# Patient Record
Sex: Male | Born: 1950
Health system: Southern US, Community
[De-identification: ages and names within clinical notes are randomized; demographics above are authoritative.]

## PROBLEM LIST (undated history)

## (undated) DIAGNOSIS — I10 Essential (primary) hypertension: Secondary | ICD-10-CM

## (undated) DIAGNOSIS — C61 Malignant neoplasm of prostate: Secondary | ICD-10-CM

## (undated) HISTORY — PX: TONSILLECTOMY: SUR1361

## (undated) HISTORY — DX: Essential (primary) hypertension: I10

## (undated) HISTORY — PX: COLONOSCOPY: SHX174

## (undated) HISTORY — PX: PROSTATE BIOPSY: SHX241

---

## 2002-03-01 ENCOUNTER — Emergency Department (HOSPITAL_COMMUNITY): Admission: EM | Admit: 2002-03-01 | Discharge: 2002-03-01 | Payer: Self-pay | Admitting: Emergency Medicine

## 2003-02-12 ENCOUNTER — Encounter: Admission: RE | Admit: 2003-02-12 | Discharge: 2003-02-12 | Payer: Self-pay | Admitting: Family Medicine

## 2003-02-12 ENCOUNTER — Encounter: Payer: Self-pay | Admitting: Family Medicine

## 2003-03-12 ENCOUNTER — Encounter: Admission: RE | Admit: 2003-03-12 | Discharge: 2003-03-22 | Payer: Self-pay | Admitting: Family Medicine

## 2005-04-23 ENCOUNTER — Emergency Department (HOSPITAL_COMMUNITY): Admission: EM | Admit: 2005-04-23 | Discharge: 2005-04-23 | Payer: Self-pay | Admitting: Emergency Medicine

## 2005-08-18 ENCOUNTER — Emergency Department (HOSPITAL_COMMUNITY): Admission: EM | Admit: 2005-08-18 | Discharge: 2005-08-18 | Payer: Self-pay | Admitting: Family Medicine

## 2005-11-05 ENCOUNTER — Emergency Department (HOSPITAL_COMMUNITY): Admission: EM | Admit: 2005-11-05 | Discharge: 2005-11-05 | Payer: Self-pay | Admitting: Emergency Medicine

## 2005-11-30 ENCOUNTER — Emergency Department (HOSPITAL_COMMUNITY): Admission: EM | Admit: 2005-11-30 | Discharge: 2005-11-30 | Payer: Self-pay | Admitting: Emergency Medicine

## 2009-05-31 ENCOUNTER — Emergency Department (HOSPITAL_COMMUNITY): Admission: EM | Admit: 2009-05-31 | Discharge: 2009-06-01 | Payer: Self-pay | Admitting: Emergency Medicine

## 2014-01-30 ENCOUNTER — Emergency Department (HOSPITAL_COMMUNITY)
Admission: EM | Admit: 2014-01-30 | Discharge: 2014-01-30 | Disposition: A | Discharge status: Home / Self Care | Payer: Self-pay | Attending: Emergency Medicine | Admitting: Emergency Medicine

## 2014-01-30 ENCOUNTER — Encounter (HOSPITAL_COMMUNITY): Payer: Self-pay | Admitting: Emergency Medicine

## 2014-01-30 ENCOUNTER — Emergency Department (HOSPITAL_COMMUNITY): Payer: Self-pay

## 2014-01-30 DIAGNOSIS — M79609 Pain in unspecified limb: Secondary | ICD-10-CM | POA: Insufficient documentation

## 2014-01-30 DIAGNOSIS — M7989 Other specified soft tissue disorders: Secondary | ICD-10-CM

## 2014-01-30 DIAGNOSIS — M79606 Pain in leg, unspecified: Secondary | ICD-10-CM

## 2014-01-30 DIAGNOSIS — Z791 Long term (current) use of non-steroidal anti-inflammatories (NSAID): Secondary | ICD-10-CM | POA: Insufficient documentation

## 2014-01-30 DIAGNOSIS — Z79899 Other long term (current) drug therapy: Secondary | ICD-10-CM | POA: Insufficient documentation

## 2014-01-30 DIAGNOSIS — F10929 Alcohol use, unspecified with intoxication, unspecified: Secondary | ICD-10-CM

## 2014-01-30 DIAGNOSIS — Z880 Allergy status to penicillin: Secondary | ICD-10-CM | POA: Insufficient documentation

## 2014-01-30 DIAGNOSIS — F1010 Alcohol abuse, uncomplicated: Secondary | ICD-10-CM | POA: Insufficient documentation

## 2014-01-30 LAB — CBC WITH DIFFERENTIAL/PLATELET
Basophils Absolute: 0 10*3/uL (ref 0.0–0.1)
Basophils Relative: 0 % (ref 0–1)
Eosinophils Absolute: 0.1 10*3/uL (ref 0.0–0.7)
Eosinophils Relative: 1 % (ref 0–5)
HCT: 39 % (ref 39.0–52.0)
Hemoglobin: 13 g/dL (ref 13.0–17.0)
Lymphocytes Relative: 46 % (ref 12–46)
Lymphs Abs: 2.7 10*3/uL (ref 0.7–4.0)
MCH: 29.6 pg (ref 26.0–34.0)
MCHC: 33.3 g/dL (ref 30.0–36.0)
MCV: 88.8 fL (ref 78.0–100.0)
Monocytes Absolute: 0.4 10*3/uL (ref 0.1–1.0)
Monocytes Relative: 6 % (ref 3–12)
Neutro Abs: 2.7 10*3/uL (ref 1.7–7.7)
Neutrophils Relative %: 46 % (ref 43–77)
Platelets: 293 10*3/uL (ref 150–400)
RBC: 4.39 MIL/uL (ref 4.22–5.81)
RDW: 15.8 % — ABNORMAL HIGH (ref 11.5–15.5)
WBC: 5.8 10*3/uL (ref 4.0–10.5)

## 2014-01-30 LAB — ETHANOL: Alcohol, Ethyl (B): 345 mg/dL — ABNORMAL HIGH (ref 0–11)

## 2014-01-30 LAB — COMPREHENSIVE METABOLIC PANEL
ALT: 24 U/L (ref 0–53)
AST: 23 U/L (ref 0–37)
Albumin: 3.8 g/dL (ref 3.5–5.2)
Alkaline Phosphatase: 48 U/L (ref 39–117)
BUN: 9 mg/dL (ref 6–23)
CO2: 27 mEq/L (ref 19–32)
Calcium: 8.8 mg/dL (ref 8.4–10.5)
Chloride: 103 mEq/L (ref 96–112)
Creatinine, Ser: 0.77 mg/dL (ref 0.50–1.35)
GFR calc Af Amer: >90 mL/min (ref >90)
GFR calc non Af Amer: >90 mL/min (ref >90)
Glucose, Bld: 105 mg/dL — ABNORMAL HIGH (ref 70–99)
Potassium: 4.1 mEq/L (ref 3.7–5.3)
Sodium: 143 mEq/L (ref 137–147)
Total Bilirubin: <0.2 mg/dL — ABNORMAL LOW (ref 0.3–1.2)
Total Protein: 8.8 g/dL — ABNORMAL HIGH (ref 6.0–8.3)

## 2014-01-30 LAB — PROTIME-INR
INR: 1.06 (ref 0.00–1.49)
Prothrombin Time: 13.6 seconds (ref 11.6–15.2)

## 2014-01-30 MED ORDER — NAPROXEN 500 MG PO TABS: 500.0000 mg | ORAL_TABLET | Freq: Two times a day (BID) | ORAL | Status: DC

## 2014-01-30 MED ORDER — MORPHINE SULFATE 4 MG/ML IJ SOLN
4.0000 mg | Freq: Once | INTRAMUSCULAR | Status: DC
  Filled 2014-01-30: qty 1

## 2014-01-30 NOTE — Progress Notes (Signed)
Right lower extremity venous duplex completed.  Right:  No evidence of DVT, superficial thrombosis, or Baker's cyst.  Left:  Negative for DVT in the common femoral vein.  

## 2014-01-30 NOTE — ED Provider Notes (Signed)
CSN: [id number]     Arrival date & time 01/30/14  1358 History   First MD Initiated Contact with Patient 01/30/14 1421     Chief Complaint  Patient presents with  . Leg Pain    HPI Patient presents to the emergency room with complaints of right leg pain ongoing for the last week. Patient does not recall any specific injuries.  The patient has not had any fevers or chills. The pain in his calf is cramping. It increases with movement and palpation. He has noticed swelling as well. He denies any history of prior DVT or PE. 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 Patient was drinking some alcohol this morning. He had a fall and was unable to get up. He was then brought to the emergency room for evaluation.   History reviewed. No pertinent past medical history. History reviewed. No pertinent past surgical history. No family history on file. History  Substance Use Topics  . Smoking status: Not on file  . Smokeless tobacco: Not on file  . Alcohol Use: Not on file    Review of Systems  All other systems reviewed and are negative.    Allergies  Penicillins  Home Medications   Current Outpatient Rx  Name  Route  Sig  Dispense  Refill  . Aspirin-Acetaminophen-Caffeine (GOODY HEADACHE PO)   Oral   Take 1 packet by mouth daily as needed.         . Misc Natural Products (PROSTATE HEALTH) CAPS   Oral   Take 1 capsule by mouth 2 (two) times daily.         . multivitamin-iron-minerals-folic acid (CENTRUM) chewable tablet   Oral   Chew 1 tablet by mouth daily.         . naproxen (NAPROSYN) 500 MG tablet   Oral   Take 1 tablet (500 mg total) by mouth 2 (two) times daily with a meal.   30 tablet   0    BP 144/96  Pulse 98  Temp(Src) 98 F (36.7 C) (Oral)  SpO2 96% Physical Exam  Nursing note and vitals reviewed. Constitutional: He appears well-developed and well-nourished. No distress.  HENT:  Head: Normocephalic and atraumatic.  Right Ear: External  ear normal.  Left Ear: External ear normal.  Eyes: Conjunctivae are normal. Right eye exhibits no discharge. Left eye exhibits no discharge. No scleral icterus.  Neck: Neck supple. No tracheal deviation present.  Cardiovascular: Normal rate, regular rhythm and intact distal pulses.   Pulmonary/Chest: Effort normal and breath sounds normal. No stridor. No respiratory distress. He has no wheezes. He has no rales.  Abdominal: Soft. Bowel sounds are normal. He exhibits no distension. There is no tenderness. There is no rebound and no guarding.  Musculoskeletal: He exhibits edema and tenderness.  Edema and tenderness of the right calf, no swelling noted about the ankle, no knee tenderness, no knee effusion, strong dorsalis pedis pulse in the right foot, extremities warm and well perfused  Neurological: He is alert. He has normal strength. No cranial nerve deficit (no facial droop, extraocular movements intact, no slurred speech) or sensory deficit. He exhibits normal muscle tone. He displays no seizure activity. Coordination normal.  Skin: Skin is warm and dry. No rash noted.  Psychiatric: He has a normal mood and affect.    ED Course  Procedures (including critical care time) Labs Review Labs Reviewed  CBC WITH DIFFERENTIAL - Abnormal; Notable for the following:    RDW 15.8 (*)  All other components within normal limits  COMPREHENSIVE METABOLIC PANEL - Abnormal; Notable for the following:    Glucose, Bld 105 (*)    Total Protein 8.8 (*)    Total Bilirubin <0.2 (*)    All other components within normal limits  ETHANOL - Abnormal; Notable for the following:    Alcohol, Ethyl (B) 345 (*)    All other components within normal limits  PROTIME-INR   Imaging Review Dg Tibia/fibula Right  01/30/2014   CLINICAL DATA:  Right leg pain.  EXAM: RIGHT TIBIA AND FIBULA - 2 VIEW  COMPARISON:  None.  FINDINGS: Two views of the right lower leg were obtained. There is an old fracture of the proximal  fibula with callus formation. Areas of sclerosis in the distal tibia and fibula may also be related to posttraumatic changes. No evidence for an acute fracture or dislocation. The knee and ankle are located.  IMPRESSION: No acute bone abnormality in the right lower leg. Old right fibular fracture.   Electronically Signed   By: Markus Daft M.D.   On: 01/30/2014 15:07   Dg Ankle Complete Right  01/30/2014   CLINICAL DATA:  Right leg pain.  EXAM: RIGHT ANKLE - COMPLETE 3+ VIEW  COMPARISON:  None.  FINDINGS: There is diffuse soft tissue thickening or swelling in the right lower leg. The right ankle is located. Mild degenerative changes along the medial aspect of the ankle joint. Small spur along the plantar aspect of the calcaneus. Mild cortical thickening along the lateral aspect of the distal tibia could be related to an old injury but no acute fracture.  IMPRESSION: Mild degenerative changes in the right ankle without acute bone abnormality.  Diffuse soft tissue swelling.   Electronically Signed   By: Markus Daft M.D.   On: 01/30/2014 15:09     MDM   1. Leg pain   2. Alcohol intoxication    No fracture or DVT.  Exam not suggestive of cellulitis.  ? Calf injury.  May have chronic edema with his prior fracture.   Will monitor in the ED until he sobers up.  At this time there does not appear to be any evidence of an acute emergency medical condition and the patient appears stable for discharge with appropriate outpatient follow up.     Kathalene Frames, MD 01/30/14 [phone]

## 2014-01-30 NOTE — ED Notes (Signed)
Bed: HK32 Expected date: 01/30/14 Expected time: 1:51 PM Means of arrival: Ambulance Comments:  Intoxicated, hypertensive, leg pain

## 2014-01-30 NOTE — ED Notes (Signed)
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

## 2014-01-30 NOTE — Discharge Instructions (Signed)
°Emergency Department Resource Guide °1) Find a Doctor and Pay Out of Pocket °Although you won't have to find out who is covered by your insurance plan, it is a good idea to ask around and get recommendations. You will then need to call the office and see if the doctor you have chosen will accept you as a new patient and what types of options they offer for patients who are self-pay. Some doctors offer discounts or will set up payment plans for their patients who do not have insurance, but you will need to ask so you aren't surprised when you get to your appointment. ° °2) Contact Your Local Health Department °Not all health departments have doctors that can see patients for sick visits, but many do, so it is worth a call to see if yours does. If you don't know where your local health department is, you can check in your phone book. The CDC also has a tool to help you locate your state's health department, and many state websites also have listings of all of their local health departments. ° °3) Find a Walk-in Clinic °If your illness is not likely to be very severe or complicated, you may want to try a walk in clinic. These are popping up all over the country in pharmacies, drugstores, and shopping centers. They're usually staffed by nurse practitioners or physician assistants that have been trained to treat common illnesses and complaints. They're usually fairly quick and inexpensive. However, if you have serious medical issues or chronic medical problems, these are probably not your best option. ° °No Primary Care Doctor: °- Call [facility] at  832-8000 - they can help you locate a primary care doctor that  accepts your insurance, provides certain services, etc. °- Physician Referral Service- 1-800-533-3463 ° °Chronic Pain Problems: °Organization         Address  Phone   Notes  °[facility]  (336) 297-2271 Patients need to be referred by their primary care doctor.  ° °Medication  Assistance: °Organization         Address  Phone   Notes  °[place] [facility] [address] °[place], [place] [address] (336) 641-8030 --Must be a resident of [place] °-- Must have NO insurance coverage whatsoever (no Medicaid/ Medicare, etc.) °-- The pt. MUST have a primary care doctor that directs their care regularly and follows them in the community °  °[facility]  (866) 331-1348   °[employer]  (888) 892-1162   ° °Agencies that provide inexpensive medical care: °Organization         Address  Phone   Notes  °[facility]  (336) 832-8035   °[facility]    (336) 832-7272   °[facility] [address] °[place], [place] [address] (336) 832-4777   °[facility] [address], °[place] (336) 271-4999   °[facility]    (336) 373-0678   °[facility]    (336) 272-1050   °[facility] ° [address], [place] Phone:  (336) 832-4444, Fax:  (336) 832-4440 Hours of Operation:  9 am - 6 pm, M-F.  Also accepts Medicaid/Medicare and self-pay.  °[facility] ° [address], [place] Phone: (336) 832-3150, Fax: (336) 832-3151. Hours of Operation:  8:30 am - 5:30 pm, M-F.  Also accepts Medicaid and self-pay.  °[facility] [place] [address], [place] Phone: (336) 878-6027   °[facility] [address], [place], [place] (336)723-1848, Ext. 123 Mondays & Thursdays: 7-9 AM.  First 15 patients are seen on a first come, first serve basis. °  ° °Medicaid-accepting [place] Providers: ° °Organization         Address  Phone   Notes  °[facility] [address], [place] (336) 641-2100 Also accepts self-pay patients.  °[facility] [address], [place] ° (336) 856-9996   °[facility] [address], [place]  (336) 288-8857   °[facility] [address] [place] [address], [place] (336) 299-7000   °Veita Bland [address], [place]  ° (336) 373-1557 Only accepts [place] Access Medicaid patients after they have their name applied to their card.  ° °Self-Pay (no insurance) in [place]: ° °Organization         Address  Phone   Notes  °Sickle Cell Patients, [facility] [address], [place] (336) 832-1970   °[facility] [address], [place] (336) 832-4400   °[facility] ° [address], [place] (336) 992-4800   °[facility]/Dr. Osei-Bonsu ° [address] [place] [address], [place] or [address], [place] (336) [phone] Phone number for both [place] and [place] locations is the same.  °[facility] [address], [place] (336) 299-0000   °[facility] [address] [place] [address], [place] or [address] (336) 852-7530 °(336) 878-2260   °[facility] [address], [place] (336) 350-1642, phone; (336) 294-5005, fax Sees patients 1st and 3rd Saturday of every month.  Must not qualify for public or private insurance (i.e. Medicaid, Medicare, [place] Health Choice, Veterans' Benefits) • Household income should be no more than 200% of the poverty level •The clinic cannot treat you if you are pregnant or think you are pregnant • Sexually transmitted diseases are not treated at the clinic.  ° ° °Dental Care: °Organization         Address  Phone  Notes  °[place] [facility] [address], [place] (336) 641-6152 Accepts children up to age 21 who are enrolled in Medicaid or [place] Health Choice; pregnant women with a Medicaid card; and children who have applied for Medicaid or [place] Health Choice, but were declined, whose parents can pay a reduced fee at time of service.  °[place]  [facility] [place]  [address], [place] (336) 641-7733 Accepts children up to age 21 who are enrolled in Medicaid or [place] Health Choice; pregnant women with a Medicaid card; and children who have applied for Medicaid or [place] Health Choice, but were declined, whose parents can pay a reduced fee at time of service.  °[facility] ° [address],  (336) 641-4533 Patients are seen by appointment only. Walk-ins are not accepted. [facility] will see patients 18 years of age and older. °Monday - Tuesday (8am-5pm) °Most Wednesdays (8:30-5pm) °$30 per visit, cash only  °[facility] ° [address], [place] (336) 641-4533 Patients are seen by appointment only. Walk-ins are not accepted. [facility] will see patients 18 years of age and older. °One   Wednesday Evening (Monthly: Volunteer Based).  $30 per visit, cash only  °[facility]  (919) 537-3737 for adults; Children under age 4, call Graduate Pediatric Dentistry at (919) [phone]. Children aged 4-14, please call (919) [phone] to request a pediatric application. ° Dental services are provided in all areas of dental care including fillings, crowns and bridges, complete and partial dentures, implants, gum treatment, root canals, and extractions. Preventive care is also provided. Treatment is provided to both adults and children. °Patients are selected via a lottery and there is often a waiting list. °  °[facility] [address], °[place] ° (336) 763-8833 [email or web address] °  °[facility] [address], [place], [place] (336)723-1848, Ext. 123 Second and Fourth Thursday of each month, opens at 6:30 AM; Clinic ends at 9 AM.  Patients are seen on a first-come first-served basis, and a limited number are seen during each clinic.  ° °[facility] ° [address], [place], [place] (336) 723-7904    Eligibility Requirements °You must have lived in [place], [place], or [place] counties for at least the last three months. °  You cannot be eligible for state or federal sponsored healthcare insurance, including Veterans Administration, Medicaid, or Medicare. °  You generally cannot be eligible for healthcare insurance through your employer.  °  How to apply: °Eligibility screenings are held every Tuesday and Wednesday afternoon from 1:00 pm until 4:00 pm. You do not need an appointment for the interview!  °[facility] [address], [place], [place] 336-631-2330   °[facility]  336-342-8273   °[facility]  336-703-3100   °[facility]  336-570-6415   ° °Behavioral Health Resources in the Community: °Intensive Outpatient Programs °Organization         Address  Phone  Notes  °[facility] [address], [place], [place] 336-878-6098   °[facility] [address], [place], [place] 336-832-9800   °[facility] [address], [place], [place] ° 336-882-2125   °[facility] [address],  °[place], [place] 1-800-853-5163 or 336-641-4981   °Substance Abuse Resources °Organization         Address  Phone  Notes  °[facility]  336-882-2125   °[facility]  336-784-9470   °[facility]  336-285-9073   °[facility]  336-845-3988   °Residential & Outpatient Substance Abuse Program  1-800-659-3381   °Psychological Services °Organization         Address  Phone  Notes  °[facility]  336- 832-9600   °[facility]  336- 378-7881   °[facility] [address], [place] 1-800-853-5163 or 336-641-4981   ° °Mobile Crisis Teams °Organization         Address  Phone  Notes  °[facility], Mobile Crisis Care Unit  1-877-626-1772   °Assertive °[facility] ° [address]  [place], [place] 336-834-9664   °Sharon DeEsch [address] °[place] 336-554-5454   ° °Self-Help/Support Groups °Organization         Address  Phone             Notes  °[facility] - variety of support groups  336- 373-1402 Call for more information  °Narcotics Anonymous (NA), [facility] [address], °[place]  2 meetings at this location  ° °  Residential Treatment Programs °Organization         Address  Phone  Notes  °[facility] [address],    °[place]  1-866-801-8205   °[facility] ° [address], [place], [place] 704-293-8524   °[facility] [address], [place] 336-845-3988 Admissions: 8am-3pm M-F  °[facility] [address]    °[place], [place] 336-841-1104   °[facility] [address], [place], [place] 336-379-7146   °[facility] [address]  °[place], [place] 336-285-9073   °[facility] Intensive Outpatient [address], [place], [place] 336-852-3033   °[facility] [address]  °[place], [place] 1-877-615-2722 or 336-784-9470   °[facility] [address] [place], [place] 336-227-7417 Accepts Medicaid  °[facility] [address]  °[place] 1-800-659-3381 Substance Abuse/Addiction Treatment  ° °[place] Behavioral Health Resources °Organization         Address  Phone  Notes  °[facility]  (888) 581-9988   °Julie Brannon, PhD [address] [place], [place]   (336) 349-5553 or (336) 951-0000   °[facility]   [address] °[place], [place] (336) 349-4454   °[facility] [address], [place], [place] (336) 342-8316 Insurance/Medicaid/sponsorship through [facility]  °[facility] [address]                                    [place], [place] (336) 342-8316 Therapy/tele-psych/case    °[facility] [address]  ° [place], [place] (336) 349-2233    °Dr. Arfeen  (336) 349-4544   °[facility] [place]  [employer] [place] [facility] 1) [address],  °2) [address], [place] °3)  [address], [place] (336) 349-3220 °(336) 342-7768 ° °(336) 342-8140   °[place] [facility] (336) 342-1394 or (336) 342-3537 (After Hours)    ° ° °

## 2014-01-30 NOTE — ED Notes (Signed)
Pt in from home by [facility], reports Rt leg pain x1 week. Reports fall today. Friends unable to help him up so ems called. ETOH on board. Pt denies medical Hx or daily meds. EMS reports R leg and ankle swelling.

## 2014-01-30 NOTE — ED Provider Notes (Signed)
6:54 PM Patient signed out to me by Dr. Tomi Bamberger and is now clinically sober. His speech is normal, his gait is not staggering. He will get a cab home  Leota Jacobsen, MD 01/30/14 [phone]

## 2014-04-17 ENCOUNTER — Ambulatory Visit: Payer: Self-pay | Admitting: Physician Assistant

## 2014-04-17 VITALS — BP 160/108 | HR 83 | Temp 98.0°F | Resp 16 | Ht 67.0 in | Wt 270.0 lb

## 2014-04-17 DIAGNOSIS — I10 Essential (primary) hypertension: Secondary | ICD-10-CM | POA: Insufficient documentation

## 2014-04-17 DIAGNOSIS — Z0289 Encounter for other administrative examinations: Secondary | ICD-10-CM

## 2014-04-17 NOTE — Patient Instructions (Signed)
Your blood pressure is too high. According to the guidelines, I have given you a card for 3 months.   During that time, you need to see your new primary care provider and start on blood pressure medication. Your blood pressure must be no higher than 140/90 to maintain your medical eligibility. Losing weight will help, but you should expect to be on blood pressure medicine from now on.  When you return for your next re-certification exam, you have 2 options: 1) you can have the entire exam re-done, for 1 year from that date, by any certified examiner, or 2) I can do an addendum to the original exam today, and extend the expiration date to 1 year from [place].  This option is only available if I am the provider who sees you at the visit.  No other provider can addend and extend the date.

## 2014-04-17 NOTE — Progress Notes (Signed)
This patient presents for DOT examination for fitness for duty.  Last DOT certification was for 2 years, expiration date 5-6 years ago. 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  Medical History: no  Any illness or injury in the last 5 years? no  Head/Brain Injuries, disorders or illnesses no  Seizures, epilepsy no  Eye disorders or impaired vision (except corrective lenses) no  Ear disorders, loss of hearing or balance no  Heart disease or heart attack; other cardiovascular condition no  Heart surgery (valve replacement/bypass, angioplasty, pacemaker) no  High blood pressure no  Muscular disease no  Shortness of breath no  Lung disease, emphysema, asthma, chronic bronchitis no  Kidney disease, dialysis no  Liver disease no  Digestive problems no  Diabetes or elevated blood sugar no  Nervious or psychiatric disorders, e.g., severe depression no  Loss of, or altered consciousness no  Fainting, dizziness no  Sleep disorders, pauses in breathing while asleep, daytime sleepiness, loud snoring no  Stroke or paralysis no  Missing or impaired hand, arm, foot, leg, finger, toe no  Spinal injury or disease no  Chronic low back pain no  Regular, frequent alcohol use no  Narcotic or habit forming drug use  Current Medications: Prior to Admission medications   Medication Sig Start Date End Date Taking? Authorizing Provider  Misc Natural Products (PROSTATE HEALTH) CAPS Take 1 capsule by mouth 2 (two) times daily.   Yes Historical Provider, MD  multivitamin-iron-minerals-folic acid (CENTRUM) chewable tablet Chew 1 tablet by mouth daily.   Yes Historical Provider, MD  Aspirin-Acetaminophen-Caffeine (GOODY HEADACHE PO) Take 1 packet by mouth daily as needed.    Historical Provider, MD    Primary Care Provider: No PCP Per Patient Specialists: none  Medical Examiner's Comments on Health History:  Previously told his BP was elevated, and  briefly took medication.  He reports that on recheck, his BP was normal, and the medication was not continued.  TESTING:   Visual Acuity Screening   Right eye Left eye Both eyes  Without correction: 20/30 20/30 20/20   With correction:     Comments: Peripheral Vision: Right eye 85 degrees. Left eye 85 degrees.  The patient can distinguish the colors red, amber and green.  Hearing Screening Comments: The patient was able to hear a forced whisper from 10 feet.  Monocular Vision: no  Hearing Aid used for test: no Hearing Aid required to to meet standard: no Distance from individual at which forced whispered voice can first be heard:   RIGHT ear 10 feet; LEFT ear 10 feet If audiometer used, record hearing loss in decibels:  RIGHT ear average N/A dB  LEFT ear average N/A dB  BP 172/100  Pulse 83  Temp(Src) 98 F (36.7 C) (Oral)  Resp 16  Ht 5\' 7"  (1.702 m)  Wt 270 lb (122.471 kg)  BMI 42.28 kg/m2  SpO2 97%  Pulse rate is regular  Urine Specimen: Specific Gravity 1.030, Protein NEG, Blood NEG, Sugar NEG  Other Testing: none indicated  PHYSICAL EXAMINATION:  1. yes General Appearance 2. no Eyes   3. no Ears     4. no Mouth and Throat    5. no Heart     6. no Lungs and Chest, not including breast examination  7. no Abdomen and Viscera   8. no Vascular System    9. no Genitourinary System   10. no Extremities-Limb impaired.  11.  no Spine, other musculoskeletal  12. no Neurological     Comments: Very obese.  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  Meets standards, but periodic monitoring required due to: HTN  Driver qualified only for: 3 months   Wearing corrective lenses: no Wearing hearing aid: no Accompanied by a N/A waiver/exemption Skill performance Evaluation (SPE) Certificate: no Driving within an exempt intracity zone: no Qualified by operation of 49 CFR 546.56: no  Certification  expires 07/17/2014

## 2014-07-17 ENCOUNTER — Inpatient Hospital Stay (HOSPITAL_COMMUNITY): Payer: BC Managed Care – PPO

## 2014-07-17 ENCOUNTER — Emergency Department (HOSPITAL_COMMUNITY): Payer: BC Managed Care – PPO

## 2014-07-17 ENCOUNTER — Inpatient Hospital Stay (HOSPITAL_COMMUNITY)
Admission: EM | Admit: 2014-07-17 | Discharge: 2014-07-26 | DRG: 493 | Disposition: A | Discharge status: Skilled Nursing Facility | Payer: BC Managed Care – PPO | Attending: Orthopaedic Surgery | Admitting: Orthopaedic Surgery

## 2014-07-17 ENCOUNTER — Encounter (HOSPITAL_COMMUNITY): Payer: Self-pay | Admitting: Emergency Medicine

## 2014-07-17 ENCOUNTER — Encounter (HOSPITAL_COMMUNITY)
Admission: EM | Disposition: A | Discharge status: Skilled Nursing Facility | Payer: Self-pay | Source: Home / Self Care | Attending: Orthopaedic Surgery

## 2014-07-17 ENCOUNTER — Encounter (HOSPITAL_COMMUNITY): Payer: BC Managed Care – PPO | Admitting: Anesthesiology

## 2014-07-17 ENCOUNTER — Emergency Department (HOSPITAL_COMMUNITY): Payer: BC Managed Care – PPO | Admitting: Anesthesiology

## 2014-07-17 DIAGNOSIS — F10229 Alcohol dependence with intoxication, unspecified: Secondary | ICD-10-CM | POA: Diagnosis present

## 2014-07-17 DIAGNOSIS — D62 Acute posthemorrhagic anemia: Secondary | ICD-10-CM | POA: Diagnosis not present

## 2014-07-17 DIAGNOSIS — Z880 Allergy status to penicillin: Secondary | ICD-10-CM

## 2014-07-17 DIAGNOSIS — Z6841 Body Mass Index (BMI) 40.0 and over, adult: Secondary | ICD-10-CM

## 2014-07-17 DIAGNOSIS — S82892C Other fracture of left lower leg, initial encounter for open fracture type IIIA, IIIB, or IIIC: Secondary | ICD-10-CM

## 2014-07-17 DIAGNOSIS — F10920 Alcohol use, unspecified with intoxication, uncomplicated: Secondary | ICD-10-CM

## 2014-07-17 DIAGNOSIS — I158 Other secondary hypertension: Secondary | ICD-10-CM

## 2014-07-17 DIAGNOSIS — F17200 Nicotine dependence, unspecified, uncomplicated: Secondary | ICD-10-CM | POA: Diagnosis present

## 2014-07-17 DIAGNOSIS — F10929 Alcohol use, unspecified with intoxication, unspecified: Secondary | ICD-10-CM

## 2014-07-17 DIAGNOSIS — S82899B Other fracture of unspecified lower leg, initial encounter for open fracture type I or II: Secondary | ICD-10-CM

## 2014-07-17 DIAGNOSIS — R296 Repeated falls: Secondary | ICD-10-CM | POA: Diagnosis present

## 2014-07-17 DIAGNOSIS — F1010 Alcohol abuse, uncomplicated: Secondary | ICD-10-CM

## 2014-07-17 DIAGNOSIS — S82843B Displaced bimalleolar fracture of unspecified lower leg, initial encounter for open fracture type I or II: Principal | ICD-10-CM | POA: Diagnosis present

## 2014-07-17 DIAGNOSIS — I10 Essential (primary) hypertension: Secondary | ICD-10-CM | POA: Diagnosis present

## 2014-07-17 DIAGNOSIS — T148XXA Other injury of unspecified body region, initial encounter: Secondary | ICD-10-CM

## 2014-07-17 DIAGNOSIS — Z602 Problems related to living alone: Secondary | ICD-10-CM

## 2014-07-17 HISTORY — PX: I&D EXTREMITY: SHX5045

## 2014-07-17 LAB — BASIC METABOLIC PANEL
Anion gap: 20 — ABNORMAL HIGH (ref 5–15)
BUN: 7 mg/dL (ref 6–23)
CO2: 18 mEq/L — ABNORMAL LOW (ref 19–32)
Calcium: 8.3 mg/dL — ABNORMAL LOW (ref 8.4–10.5)
Chloride: 92 mEq/L — ABNORMAL LOW (ref 96–112)
Creatinine, Ser: 0.78 mg/dL (ref 0.50–1.35)
GFR calc Af Amer: >90 mL/min (ref >90)
Glucose, Bld: 94 mg/dL (ref 70–99)
POTASSIUM: 4 meq/L (ref 3.7–5.3)
Sodium: 130 mEq/L — ABNORMAL LOW (ref 137–147)

## 2014-07-17 LAB — CBC WITH DIFFERENTIAL/PLATELET
BASOS ABS: 0 10*3/uL (ref 0.0–0.1)
Basophils Relative: 0 % (ref 0–1)
Eosinophils Absolute: 0.1 10*3/uL (ref 0.0–0.7)
Eosinophils Relative: 1 % (ref 0–5)
HCT: 38.1 % — ABNORMAL LOW (ref 39.0–52.0)
Hemoglobin: 13.1 g/dL (ref 13.0–17.0)
LYMPHS PCT: 42 % (ref 12–46)
Lymphs Abs: 3 10*3/uL (ref 0.7–4.0)
MCH: 29.4 pg (ref 26.0–34.0)
MCHC: 34.4 g/dL (ref 30.0–36.0)
MCV: 85.4 fL (ref 78.0–100.0)
MONO ABS: 0.3 10*3/uL (ref 0.1–1.0)
Monocytes Relative: 4 % (ref 3–12)
Neutro Abs: 3.8 10*3/uL (ref 1.7–7.7)
Neutrophils Relative %: 53 % (ref 43–77)
Platelets: 188 10*3/uL (ref 150–400)
RBC: 4.46 MIL/uL (ref 4.22–5.81)
RDW: 16.3 % — ABNORMAL HIGH (ref 11.5–15.5)
WBC: 7.2 10*3/uL (ref 4.0–10.5)

## 2014-07-17 LAB — URINALYSIS, ROUTINE W REFLEX MICROSCOPIC
Bilirubin Urine: NEGATIVE
GLUCOSE, UA: NEGATIVE mg/dL
HGB URINE DIPSTICK: NEGATIVE
Ketones, ur: NEGATIVE mg/dL
Leukocytes, UA: NEGATIVE
Nitrite: NEGATIVE
PROTEIN: NEGATIVE mg/dL
Specific Gravity, Urine: 1.006 (ref 1.005–1.030)
Urobilinogen, UA: 0.2 mg/dL (ref 0.0–1.0)
pH: 5 (ref 5.0–8.0)

## 2014-07-17 LAB — ETHANOL: Alcohol, Ethyl (B): 314 mg/dL — ABNORMAL HIGH (ref 0–11)

## 2014-07-17 SURGERY — IRRIGATION AND DEBRIDEMENT EXTREMITY
Anesthesia: General | Laterality: Left

## 2014-07-17 MED ORDER — THIAMINE HCL 100 MG/ML IJ SOLN
100.0000 mg | Freq: Every day | INTRAMUSCULAR | Status: DC
  Filled 2014-07-17 (×5): qty 1

## 2014-07-17 MED ORDER — METHOCARBAMOL 1000 MG/10ML IJ SOLN
500.0000 mg | Freq: Four times a day (QID) | INTRAVENOUS | Status: DC | PRN
  Filled 2014-07-17: qty 5

## 2014-07-17 MED ORDER — CEFAZOLIN SODIUM-DEXTROSE 2-3 GM-% IV SOLR
INTRAVENOUS | Status: AC
  Filled 2014-07-17: qty 50

## 2014-07-17 MED ORDER — OXYCODONE HCL 5 MG PO TABS
ORAL_TABLET | ORAL | Status: AC
  Filled 2014-07-17: qty 2

## 2014-07-17 MED ORDER — ADULT MULTIVITAMIN W/MINERALS CH
1.0000 | ORAL_TABLET | Freq: Every day | ORAL | Status: DC
  Administered 2014-07-17 – 2014-07-26 (×9): 1 via ORAL
  Filled 2014-07-17 (×10): qty 1

## 2014-07-17 MED ORDER — OXYCODONE HCL 5 MG PO TABS
5.0000 mg | ORAL_TABLET | ORAL | Status: DC | PRN
  Administered 2014-07-17: 10 mg via ORAL
  Administered 2014-07-26 (×2): 15 mg via ORAL
  Filled 2014-07-17 (×2): qty 3
  Filled 2014-07-17: qty 2

## 2014-07-17 MED ORDER — FENTANYL CITRATE 0.05 MG/ML IJ SOLN
INTRAMUSCULAR | Status: AC
  Filled 2014-07-17: qty 5

## 2014-07-17 MED ORDER — DIPHENHYDRAMINE HCL 12.5 MG/5ML PO ELIX: 25.0000 mg | ORAL_SOLUTION | ORAL | Status: DC | PRN

## 2014-07-17 MED ORDER — MIDAZOLAM HCL 2 MG/2ML IJ SOLN
INTRAMUSCULAR | Status: AC
  Filled 2014-07-17: qty 2

## 2014-07-17 MED ORDER — ONDANSETRON HCL 4 MG/2ML IJ SOLN
INTRAMUSCULAR | Status: DC | PRN
  Administered 2014-07-17: 4 mg via INTRAVENOUS

## 2014-07-17 MED ORDER — CEFAZOLIN SODIUM-DEXTROSE 2-3 GM-% IV SOLR
2.0000 g | Freq: Four times a day (QID) | INTRAVENOUS | Status: DC
  Administered 2014-07-17 – 2014-07-19 (×8): 2 g via INTRAVENOUS
  Administered 2014-07-19: 3 g via INTRAVENOUS
  Administered 2014-07-19: 2 g via INTRAVENOUS
  Filled 2014-07-17 (×13): qty 50

## 2014-07-17 MED ORDER — ENOXAPARIN SODIUM 40 MG/0.4ML ~~LOC~~ SOLN
40.0000 mg | SUBCUTANEOUS | Status: AC
  Administered 2014-07-18: 40 mg via SUBCUTANEOUS
  Filled 2014-07-17 (×2): qty 0.4

## 2014-07-17 MED ORDER — METOCLOPRAMIDE HCL 5 MG/ML IJ SOLN: 5.0000 mg | Freq: Three times a day (TID) | INTRAMUSCULAR | Status: DC | PRN

## 2014-07-17 MED ORDER — FOLIC ACID 1 MG PO TABS
1.0000 mg | ORAL_TABLET | Freq: Every day | ORAL | Status: DC
  Administered 2014-07-17 – 2014-07-26 (×9): 1 mg via ORAL
  Filled 2014-07-17 (×10): qty 1

## 2014-07-17 MED ORDER — VITAMIN B-1 100 MG PO TABS
100.0000 mg | ORAL_TABLET | Freq: Every day | ORAL | Status: DC
  Administered 2014-07-17 – 2014-07-26 (×10): 100 mg via ORAL
  Filled 2014-07-17 (×10): qty 1

## 2014-07-17 MED ORDER — METHOCARBAMOL 500 MG PO TABS
ORAL_TABLET | ORAL | Status: AC
  Filled 2014-07-17: qty 1

## 2014-07-17 MED ORDER — METHOCARBAMOL 500 MG PO TABS
500.0000 mg | ORAL_TABLET | Freq: Four times a day (QID) | ORAL | Status: DC | PRN
  Administered 2014-07-17: 500 mg via ORAL

## 2014-07-17 MED ORDER — LIDOCAINE HCL (CARDIAC) 20 MG/ML IV SOLN
INTRAVENOUS | Status: DC | PRN
  Administered 2014-07-17: 100 mg via INTRAVENOUS

## 2014-07-17 MED ORDER — SENNA 8.6 MG PO TABS
1.0000 | ORAL_TABLET | Freq: Two times a day (BID) | ORAL | Status: DC
  Administered 2014-07-17 – 2014-07-26 (×18): 8.6 mg via ORAL
  Filled 2014-07-17 (×20): qty 1

## 2014-07-17 MED ORDER — LACTATED RINGERS IV SOLN
INTRAVENOUS | Status: DC | PRN
  Administered 2014-07-17 (×2): via INTRAVENOUS

## 2014-07-17 MED ORDER — POLYETHYLENE GLYCOL 3350 17 G PO PACK: 17.0000 g | PACK | Freq: Every day | ORAL | Status: DC | PRN

## 2014-07-17 MED ORDER — MORPHINE SULFATE 2 MG/ML IJ SOLN: 1.0000 mg | INTRAMUSCULAR | Status: DC | PRN

## 2014-07-17 MED ORDER — PHENYLEPHRINE HCL 10 MG/ML IJ SOLN
INTRAMUSCULAR | Status: DC | PRN
  Administered 2014-07-17: 120 ug via INTRAVENOUS
  Administered 2014-07-17 (×3): 80 ug via INTRAVENOUS

## 2014-07-17 MED ORDER — FENTANYL CITRATE 0.05 MG/ML IJ SOLN
INTRAMUSCULAR | Status: DC | PRN
  Administered 2014-07-17: 150 ug via INTRAVENOUS

## 2014-07-17 MED ORDER — EPHEDRINE SULFATE 50 MG/ML IJ SOLN
INTRAMUSCULAR | Status: DC | PRN
  Administered 2014-07-17: 10 mg via INTRAVENOUS
  Administered 2014-07-17: 15 mg via INTRAVENOUS

## 2014-07-17 MED ORDER — HYDROMORPHONE HCL PF 1 MG/ML IJ SOLN: 0.2500 mg | INTRAMUSCULAR | Status: DC | PRN

## 2014-07-17 MED ORDER — PROPOFOL 10 MG/ML IV BOLUS
INTRAVENOUS | Status: AC
  Filled 2014-07-17: qty 20

## 2014-07-17 MED ORDER — OXYCODONE HCL 5 MG PO TABS: 5.0000 mg | ORAL_TABLET | Freq: Once | ORAL | Status: DC | PRN

## 2014-07-17 MED ORDER — SUCCINYLCHOLINE CHLORIDE 20 MG/ML IJ SOLN
INTRAMUSCULAR | Status: DC | PRN
  Administered 2014-07-17: 120 mg via INTRAVENOUS

## 2014-07-17 MED ORDER — MAGNESIUM CITRATE PO SOLN: 1.0000 | Freq: Once | ORAL | Status: AC | PRN

## 2014-07-17 MED ORDER — PROPOFOL 10 MG/ML IV BOLUS
INTRAVENOUS | Status: DC | PRN
  Administered 2014-07-17: 120 mg via INTRAVENOUS

## 2014-07-17 MED ORDER — SORBITOL 70 % SOLN: 30.0000 mL | Freq: Every day | Status: DC | PRN

## 2014-07-17 MED ORDER — SODIUM CHLORIDE 0.9 % IR SOLN
Status: DC | PRN
  Administered 2014-07-17: 6000 mL

## 2014-07-17 MED ORDER — HYDROCODONE-ACETAMINOPHEN 5-325 MG PO TABS
1.0000 | ORAL_TABLET | ORAL | Status: DC | PRN
  Administered 2014-07-17 – 2014-07-18 (×2): 2 via ORAL
  Administered 2014-07-18 – 2014-07-19 (×2): 1 via ORAL
  Administered 2014-07-20: 2 via ORAL
  Administered 2014-07-20: 1 via ORAL
  Administered 2014-07-20: 2 via ORAL
  Administered 2014-07-20: 1 via ORAL
  Administered 2014-07-21 – 2014-07-26 (×19): 2 via ORAL
  Filled 2014-07-17 (×2): qty 2
  Filled 2014-07-17: qty 1
  Filled 2014-07-17 (×8): qty 2
  Filled 2014-07-17: qty 1
  Filled 2014-07-17 (×5): qty 2
  Filled 2014-07-17: qty 1
  Filled 2014-07-17: qty 2
  Filled 2014-07-17: qty 1
  Filled 2014-07-17 (×7): qty 2

## 2014-07-17 MED ORDER — ONDANSETRON HCL 4 MG/2ML IJ SOLN: 4.0000 mg | Freq: Once | INTRAMUSCULAR | Status: DC | PRN

## 2014-07-17 MED ORDER — SUCCINYLCHOLINE CHLORIDE 20 MG/ML IJ SOLN
INTRAMUSCULAR | Status: AC
  Filled 2014-07-17: qty 1

## 2014-07-17 MED ORDER — LORAZEPAM 2 MG/ML IJ SOLN
1.0000 mg | Freq: Four times a day (QID) | INTRAMUSCULAR | Status: AC | PRN
Start: 2014-07-17 — End: 2014-07-20

## 2014-07-17 MED ORDER — LORAZEPAM 1 MG PO TABS
1.0000 mg | ORAL_TABLET | Freq: Four times a day (QID) | ORAL | Status: AC | PRN
  Administered 2014-07-19 – 2014-07-20 (×2): 1 mg via ORAL
  Filled 2014-07-17 (×2): qty 1

## 2014-07-17 MED ORDER — CEFAZOLIN SODIUM-DEXTROSE 2-3 GM-% IV SOLR
INTRAVENOUS | Status: DC | PRN
  Administered 2014-07-17: 2 g via INTRAVENOUS

## 2014-07-17 MED ORDER — MEPERIDINE HCL 25 MG/ML IJ SOLN: 6.2500 mg | INTRAMUSCULAR | Status: DC | PRN

## 2014-07-17 MED ORDER — ADULT MULTIVITAMIN W/MINERALS CH
1.0000 | ORAL_TABLET | Freq: Every day | ORAL | Status: DC
  Administered 2014-07-17 – 2014-07-18 (×2): 1 via ORAL
  Filled 2014-07-17 (×4): qty 1

## 2014-07-17 MED ORDER — HYDROMORPHONE HCL PF 1 MG/ML IJ SOLN
1.0000 mg | Freq: Once | INTRAMUSCULAR | Status: AC
  Administered 2014-07-17: 1 mg via INTRAVENOUS
  Filled 2014-07-17: qty 1

## 2014-07-17 MED ORDER — METOCLOPRAMIDE HCL 10 MG PO TABS: 5.0000 mg | ORAL_TABLET | Freq: Three times a day (TID) | ORAL | Status: DC | PRN

## 2014-07-17 MED ORDER — SODIUM CHLORIDE 0.9 % IV SOLN
INTRAVENOUS | Status: DC
  Administered 2014-07-18 (×2): via INTRAVENOUS

## 2014-07-17 MED ORDER — LIDOCAINE HCL (CARDIAC) 20 MG/ML IV SOLN
INTRAVENOUS | Status: AC
  Filled 2014-07-17: qty 5

## 2014-07-17 MED ORDER — ONDANSETRON HCL 4 MG/2ML IJ SOLN: 4.0000 mg | Freq: Four times a day (QID) | INTRAMUSCULAR | Status: DC | PRN

## 2014-07-17 MED ORDER — MIDAZOLAM HCL 2 MG/2ML IJ SOLN
INTRAMUSCULAR | Status: DC | PRN
  Administered 2014-07-17: 2 mg via INTRAVENOUS

## 2014-07-17 MED ORDER — DEXTROSE 5 % IV SOLN
3.0000 g | Freq: Once | INTRAVENOUS | Status: AC
  Administered 2014-07-17: 3 g via INTRAVENOUS
  Filled 2014-07-17: qty 3000

## 2014-07-17 MED ORDER — ONDANSETRON HCL 4 MG PO TABS: 4.0000 mg | ORAL_TABLET | Freq: Four times a day (QID) | ORAL | Status: DC | PRN

## 2014-07-17 MED ORDER — OXYCODONE HCL 5 MG/5ML PO SOLN: 5.0000 mg | Freq: Once | ORAL | Status: DC | PRN

## 2014-07-17 MED ORDER — TETANUS-DIPHTH-ACELL PERTUSSIS 5-2.5-18.5 LF-MCG/0.5 IM SUSP
0.5000 mL | Freq: Once | INTRAMUSCULAR | Status: AC
  Administered 2014-07-17: 0.5 mL via INTRAMUSCULAR
  Filled 2014-07-17: qty 0.5

## 2014-07-17 MED ORDER — SODIUM CHLORIDE 0.9 % IJ SOLN
INTRAMUSCULAR | Status: AC
  Filled 2014-07-17: qty 10

## 2014-07-17 MED ORDER — SODIUM CHLORIDE 0.9 % IV SOLN
Freq: Once | INTRAVENOUS | Status: AC
  Administered 2014-07-17: 02:00:00 via INTRAVENOUS

## 2014-07-17 SURGICAL SUPPLY — 76 items
BANDAGE ELASTIC 3 VELCRO ST LF (GAUZE/BANDAGES/DRESSINGS) IMPLANT
BANDAGE ELASTIC 4 VELCRO ST LF (GAUZE/BANDAGES/DRESSINGS) IMPLANT
BANDAGE ELASTIC 6 VELCRO ST LF (GAUZE/BANDAGES/DRESSINGS) ×2 IMPLANT
BLADE SURG 10 STRL SS (BLADE) ×3 IMPLANT
BNDG COHESIVE 1X5 TAN STRL LF (GAUZE/BANDAGES/DRESSINGS) IMPLANT
BNDG COHESIVE 4X5 TAN STRL (GAUZE/BANDAGES/DRESSINGS) ×3 IMPLANT
BNDG COHESIVE 6X5 TAN STRL LF (GAUZE/BANDAGES/DRESSINGS) ×6 IMPLANT
BNDG CONFORM 3 STRL LF (GAUZE/BANDAGES/DRESSINGS) IMPLANT
BNDG GAUZE STRTCH 6 (GAUZE/BANDAGES/DRESSINGS) ×9 IMPLANT
CORDS BIPOLAR (ELECTRODE) IMPLANT
COVER SURGICAL LIGHT HANDLE (MISCELLANEOUS) ×3 IMPLANT
CUFF TOURNIQUET SINGLE 24IN (TOURNIQUET CUFF) IMPLANT
CUFF TOURNIQUET SINGLE 34IN LL (TOURNIQUET CUFF) ×6 IMPLANT
CUFF TOURNIQUET SINGLE 44IN (TOURNIQUET CUFF) IMPLANT
DRAPE C-ARM 42X72 X-RAY (DRAPES) ×3 IMPLANT
DRAPE C-ARMOR (DRAPES) ×3 IMPLANT
DRAPE EXTREMITY BILATERAL (DRAPE) IMPLANT
DRAPE IMP U-DRAPE 54X76 (DRAPES) ×3 IMPLANT
DRAPE INCISE IOBAN 66X45 STRL (DRAPES) ×12 IMPLANT
DRAPE SURG 17X23 STRL (DRAPES) IMPLANT
DRAPE U-SHAPE 47X51 STRL (DRAPES) ×6 IMPLANT
DURAPREP 26ML APPLICATOR (WOUND CARE) ×3 IMPLANT
ELECT CAUTERY BLADE 6.4 (BLADE) ×3 IMPLANT
ELECT REM PT RETURN 9FT ADLT (ELECTROSURGICAL) ×3
ELECTRODE REM PT RTRN 9FT ADLT (ELECTROSURGICAL) ×1 IMPLANT
FACESHIELD WRAPAROUND (MASK) ×3 IMPLANT
FACESHIELD WRAPAROUND OR TEAM (MASK) ×1 IMPLANT
GAUZE XEROFORM 1X8 LF (GAUZE/BANDAGES/DRESSINGS) ×3 IMPLANT
GAUZE XEROFORM 5X9 LF (GAUZE/BANDAGES/DRESSINGS) ×3 IMPLANT
GLOVE SURG SS PI 7.5 STRL IVOR (GLOVE) ×6 IMPLANT
GOWN STRL REIN XL XLG (GOWN DISPOSABLE) ×3 IMPLANT
GOWN STRL REUS W/ TWL LRG LVL3 (GOWN DISPOSABLE) ×2 IMPLANT
GOWN STRL REUS W/TWL LRG LVL3 (GOWN DISPOSABLE) ×6
HANDPIECE INTERPULSE COAX TIP (DISPOSABLE)
KIT BASIN OR (CUSTOM PROCEDURE TRAY) ×3 IMPLANT
KIT ROOM TURNOVER OR (KITS) ×3 IMPLANT
MANIFOLD NEPTUNE II (INSTRUMENTS) ×3 IMPLANT
NDL HYPO 25GX1X1/2 BEV (NEEDLE) IMPLANT
NEEDLE HYPO 25GX1X1/2 BEV (NEEDLE) IMPLANT
NS IRRIG 1000ML POUR BTL (IV SOLUTION) ×6 IMPLANT
PACK ORTHO EXTREMITY (CUSTOM PROCEDURE TRAY) ×3 IMPLANT
PAD ABD 8X10 STRL (GAUZE/BANDAGES/DRESSINGS) ×3 IMPLANT
PAD ARMBOARD 7.5X6 YLW CONV (MISCELLANEOUS) ×6 IMPLANT
PAD CAST 3X4 CTTN HI CHSV (CAST SUPPLIES) ×2 IMPLANT
PADDING CAST ABS 4INX4YD NS (CAST SUPPLIES) ×4
PADDING CAST ABS COTTON 4X4 ST (CAST SUPPLIES) ×2 IMPLANT
PADDING CAST COTTON 3X4 STRL (CAST SUPPLIES) ×6
PADDING CAST COTTON 6X4 STRL (CAST SUPPLIES) ×3 IMPLANT
SET HNDPC FAN SPRY TIP SCT (DISPOSABLE) IMPLANT
SPLINT PLASTER CAST XFAST 5X30 (CAST SUPPLIES) IMPLANT
SPLINT PLASTER XFAST SET 5X30 (CAST SUPPLIES) ×2
SPONGE GAUZE 4X4 12PLY (GAUZE/BANDAGES/DRESSINGS) ×6 IMPLANT
SPONGE GAUZE 4X4 12PLY STER LF (GAUZE/BANDAGES/DRESSINGS) ×2 IMPLANT
SPONGE LAP 18X18 X RAY DECT (DISPOSABLE) ×6 IMPLANT
STOCKINETTE IMPERVIOUS 9X36 MD (GAUZE/BANDAGES/DRESSINGS) ×3 IMPLANT
SUCTION FRAZIER TIP 10 FR DISP (SUCTIONS) ×3 IMPLANT
SUT ETHILON 2 0 FS 18 (SUTURE) ×13 IMPLANT
SUT ETHILON 2 0 PSLX (SUTURE) ×3 IMPLANT
SUT ETHILON 3 0 PS 1 (SUTURE) ×6 IMPLANT
SUT VIC AB 0 CT1 27 (SUTURE)
SUT VIC AB 0 CT1 27XBRD ANBCTR (SUTURE) IMPLANT
SUT VIC AB 2-0 CT1 27 (SUTURE)
SUT VIC AB 2-0 CT1 36 (SUTURE) ×3 IMPLANT
SUT VIC AB 2-0 CT1 TAPERPNT 27 (SUTURE) IMPLANT
SUT VIC AB 2-0 FS1 27 (SUTURE) ×6 IMPLANT
SYR CONTROL 10ML LL (SYRINGE) IMPLANT
TOWEL OR 17X24 6PK STRL BLUE (TOWEL DISPOSABLE) ×3 IMPLANT
TOWEL OR 17X26 10 PK STRL BLUE (TOWEL DISPOSABLE) ×6 IMPLANT
TUBE ANAEROBIC SPECIMEN COL (MISCELLANEOUS) IMPLANT
TUBE CONNECTING 12'X1/4 (SUCTIONS) ×1
TUBE CONNECTING 12X1/4 (SUCTIONS) ×2 IMPLANT
TUBE FEEDING 5FR 15 INCH (TUBING) IMPLANT
TUBING CYSTO DISP (UROLOGICAL SUPPLIES) ×3 IMPLANT
UNDERPAD 30X30 INCONTINENT (UNDERPADS AND DIAPERS) ×6 IMPLANT
WATER STERILE IRR 1000ML POUR (IV SOLUTION) ×3 IMPLANT
YANKAUER SUCT BULB TIP NO VENT (SUCTIONS) ×3 IMPLANT

## 2014-07-17 NOTE — H&P (Addendum)
ORTHOPAEDIC HISTORY AND PHYSICAL   Chief Complaint: Left ankle injury  HPI: Jared Rhodes. is a 63 y.o. male who complains of left ankle injury earlier tonight.  He was celebrating his bday and had 5-6 beers.  Was stepping out of the cab and twisted his ankle on the curb and sustained an open fracture-dislocation with a medial wound.   Ortho consulted.  Past Medical History  Diagnosis Date  . Hypertension    Past Surgical History  Procedure Laterality Date  . Tonsillectomy     History   Social History  . Marital Status: Legally Separated    Spouse Name: n/a    Number of Children: 0  . 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 Smoking status: Never Smoker   . Smokeless tobacco: Never Used  . Alcohol Use: Yes     Comment: every now and then  . Drug Use: No  . Sexual Activity: Yes    Partners: Female   Other Topics Concern  . None   Social History Narrative   Lives alone. 2 brothers and 1 sister live in [place].  1 half-sister lives in [place].   Family History  Problem Relation Age of Onset  . Alcohol abuse Mother   . Hyperlipidemia Brother   . Heart disease Sister    Allergies  Allergen Reactions  . Penicillins Rash   Prior to Admission medications   Medication Sig Start Date End Date Taking? Authorizing Provider  multivitamin-iron-minerals-folic acid (CENTRUM) chewable tablet Chew 1 tablet by mouth daily.   Yes Historical Provider, MD   Dg Knee Left Port  07/17/2014   CLINICAL DATA:  Fracture  EXAM: PORTABLE LEFT KNEE - 1-2 VIEW  COMPARISON:  None.  FINDINGS: There is no evidence of fracture, dislocation, or joint effusion. Mild degenerative changes present within the medial femoral tibial and patellofemoral joint space compartments. No soft tissue abnormality.  IMPRESSION: Negative.   Electronically Signed   By: Jeannine Boga M.D.   On: 07/17/2014 04:01   Dg  Tibia/fibula Left Port  07/17/2014   CLINICAL DATA:  Fracture  EXAM: PORTABLE LEFT TIBIA AND FIBULA - 2 VIEW  COMPARISON:  Prior radiograph of the left ankle.  FINDINGS: Oblique fracture of the distal fibula is partially visualized, better seen on prior ankle radiograph. No other acute fracture seen about the tibia and fibula. Soft tissue swelling present about the ankle.  IMPRESSION: 1. Acute oblique fracture of the distal fibula, better evaluated on Prior radiograph of the left ankle. No other acute traumatic injury about the left tibia and fibula.   Electronically Signed   By: Jeannine Boga M.D.   On: 07/17/2014 03:56   Dg Ankle Left Port  07/17/2014   CLINICAL DATA:  Ankle injury climbing stairs.  Deformity.  EXAM: PORTABLE LEFT ANKLE - 2 VIEW  COMPARISON:  No comparison studies available.  FINDINGS: Portable two view exam of the left ankle shows fracture dislocation. The talus is dislocated laterally relative to the tibial plafond. There is an associated fracture of the distal fibula.  IMPRESSION: Fracture dislocation of the ankle.   Electronically Signed   By: Misty Stanley M.D.   On: 07/17/2014 02:28    Positive ROS: All other systems have been reviewed and were otherwise negative with the exception of those mentioned in the HPI and as above.  Physical Exam: General: Alert,  no acute distress Cardiovascular: No pedal edema Respiratory: No cyanosis, no use of accessory musculature GI: No organomegaly, abdomen is soft and non-tender Skin: No lesions in the area of chief complaint Neurologic: Sensation intact distally Psychiatric: Patient is competent for consent with normal mood and affect Lymphatic: No axillary or cervical lymphadenopathy  MUSCULOSKELETAL:  - 10 cm transverse open wound on the medial aspect of ankle - exposed distal tibia - no gross contam - foot is NVI and WWP  Assessment: Left type III open ankle fracture dislocation  Plan: - bedside irrigation with  reduction and splinting - hospitalist consulted for preop assessment and clearance - ancef, tetanus given in ED - NPO - will need formal I&D and possible immediate ORIF - stressed the importance of smoking cessation  We counseled (him/her) today about her smoking cessation to limit (his/her) risk of postoperative infection.  Tobacco use has been associated with many illnesses including heart disease, blood vessel disease, and several forms of cancer.  In addition, tobacco use has been shown to negatively impact bone and surgical wound healing.  Carbon monoxide reduces tissue oxygenation by reducing oxygen levels in the blood and impairs the microcirculation within healing soft tissue and bone.  Nicotine is also a potent vasoconstrictor and impairs the revascularization of healing bone leading to impaired bone and surgical wound healing.  Nicotine causes blood vessels to constrict and less oxygen is able to reach cells in the wound.  This can lead to longer wound healing times and improper healing.  Nicotine causes cells that form bone tissue to be unable to function or grow as they normally would which causes delayed or incomplete bone healing.  This may lead to continued pain and possibility for an additional surgical intervention.  Taken as a whole, quitting smoking prior to surgery is the best thing that can be done to maximize outcome and decrease the risk for complications.  Smoking cessation was offered to the patient.    Azucena Cecil, MD [place] 4:06 AM

## 2014-07-17 NOTE — ED Notes (Signed)
Pt taken to OR.

## 2014-07-17 NOTE — Anesthesia Procedure Notes (Signed)
Procedure Name: Intubation Date/Time: 07/17/2014 5:30 AM Performed by: Valetta Fuller Pre-anesthesia Checklist: Patient identified, Emergency Drugs available, Suction available and Patient being monitored Patient Re-evaluated:Patient Re-evaluated prior to inductionOxygen Delivery Method: Circle system utilized Preoxygenation: Pre-oxygenation with 100% oxygen Intubation Type: IV induction, Rapid sequence and Cricoid Pressure applied Laryngoscope Size: Mac and 4 Number of attempts: 2 Airway Equipment and Method: Stylet Placement Confirmation: ETT inserted through vocal cords under direct vision,  positive ETCO2 and breath sounds checked- equal and bilateral Secured at: 23 cm Tube secured with: Tape Dental Injury: Teeth and Oropharynx as per pre-operative assessment  Comments: DLX1 by Arther Dames, CRNA, with Sabra Heck 2. Unable to visualize cords. DL x 1 by K. Conrad Foraker, MD with Mac 4. ATOI.

## 2014-07-17 NOTE — ED Notes (Signed)
Celebrating birthday. etoh on board. Mechanical fall as he was going up stairs. Injured left ankle  - open fx. Pedal pulse present. Cms intact. ~ 3" laceration. Bleeding controlled. Vs 190/110, hr 110, rr. 24,

## 2014-07-17 NOTE — Consult Note (Signed)
[facility] Medical Consultation  [facility]. WUJ:[id number] DOB: 1951-10-02 DOA: 07/17/2014 PCP: No PCP Per Patient   Requesting physician: Dr. Georga Kaufmann Date of consultation: 07/17/14 Reason for consultation: Pre-op eval and EtOH use.  Impression/Recommendations Principal Problem:   Open left ankle fracture Active Problems:   Alcohol intoxication    1. Open left ankle fracture - patient is cleared to go to OR for this emergent surgery given this limb threatening condition.  Additionally he has no risk of CAD, is able to walk up a flight of stairs without SOB or chest pain, no recent episodes of chest pain, and his EKG is unremarkable other than being possibly borderline for LVH.  Regardless, no additional pre-op cardiac work up is indicated given the emergent nature of the surgery. 2. EtOH intoxication - patient reports drinking "only on the weekends sometimes".  He denies any history of alcohol withdrawal or DTs, he routinely stops drinking during the week he states.  He states he was just drinking tonight to celebrate his birthday.  Overall feel the patient is at low risk for complications of EtOH withdrawal given this history.  If he develops complications while inpatient, we will of course treat him with CIWA protocol.  I will followup again tomorrow. Please contact me if I can be of assistance in the meanwhile. Thank you for this consultation.   Chief Complaint: Ankle injury  HPI:  63 yo F with HTN who was drinking to celebrate his birthday tonight, trying to get in a cab, lost ballance over the curb, rolled his ankle.  He ended up with an open ankle fracture and being brought in by EMS.  Review of Systems:  Negative for recent CP, SOB, DOE, 12 systems reviewed and otherwise negative.  Past Medical History  Diagnosis Date  . Hypertension    Past Surgical History  Procedure Laterality Date  . Tonsillectomy     Social History:  reports that he has never smoked.  He has never used smokeless tobacco. He reports that he drinks alcohol. He reports that he does not use illicit drugs.  Allergies  Allergen Reactions  . Penicillins Rash   Family History  Problem Relation Age of Onset  . Alcohol abuse Mother   . Hyperlipidemia Brother   . Heart disease Sister     Prior to Admission medications   Medication Sig Start Date End Date Taking? Authorizing Provider  multivitamin-iron-minerals-folic acid (CENTRUM) chewable tablet Chew 1 tablet by mouth daily.   Yes Historical Provider, MD   Physical Exam: Blood pressure 127/74, pulse 85, temperature 98.3 F (36.8 C), temperature source Oral, resp. rate 19, height 5\' 8"  (1.727 m), weight 124.739 kg (275 lb), SpO2 100.00%. Filed Vitals:   07/17/14 0255  BP: 127/74  Pulse:   Temp:   Resp: 19    General:  NAD, resting comfortably in bed Eyes: PEERLA EOMI ENT: mucous membranes moist Neck: supple w/o JVD Cardiovascular: RRR w/o MRG Respiratory: CTA B Abdomen: soft, nt, nd, bs+ Skin: no rash nor lesion Musculoskeletal: The L ankle has an open fracture.  There is a 10 cm wound where the distal tibia is sticking through his skin and his ankle is grosly deformed.  There is no gross contamination, there is a significant amount bleeding from the site but does not appear to have massive blood loss. Psychiatric: normal tone and affect Neurologic: AAOx3, grossly non-focal  Labs on Admission:  Basic Metabolic Panel:  Recent Labs Lab 07/17/14 0120  NA 130*  K 4.0  CL 92*  CO2 18*  GLUCOSE 94  BUN 7  CREATININE 0.78  CALCIUM 8.3*   Liver Function Tests: No results found for this basename: AST, ALT, ALKPHOS, BILITOT, PROT, ALBUMIN,  in the last 168 hours No results found for this basename: LIPASE, AMYLASE,  in the last 168 hours No results found for this basename: AMMONIA,  in the last 168 hours CBC:  Recent Labs Lab 07/17/14 0120  WBC 7.2  NEUTROABS 3.8  HGB 13.1  HCT 38.1*  MCV 85.4  PLT  188   Cardiac Enzymes: No results found for this basename: CKTOTAL, CKMB, CKMBINDEX, TROPONINI,  in the last 168 hours BNP: No components found with this basename: POCBNP,  CBG: No results found for this basename: GLUCAP,  in the last 168 hours  Radiological Exams on Admission: Dg Knee Left Port  07/17/2014   CLINICAL DATA:  Fracture  EXAM: PORTABLE LEFT KNEE - 1-2 VIEW  COMPARISON:  None.  FINDINGS: There is no evidence of fracture, dislocation, or joint effusion. Mild degenerative changes present within the medial femoral tibial and patellofemoral joint space compartments. No soft tissue abnormality.  IMPRESSION: Negative.   Electronically Signed   By: Jeannine Boga M.D.   On: 07/17/2014 04:01   Dg Tibia/fibula Left Port  07/17/2014   CLINICAL DATA:  Fracture  EXAM: PORTABLE LEFT TIBIA AND FIBULA - 2 VIEW  COMPARISON:  Prior radiograph of the left ankle.  FINDINGS: Oblique fracture of the distal fibula is partially visualized, better seen on prior ankle radiograph. No other acute fracture seen about the tibia and fibula. Soft tissue swelling present about the ankle.  IMPRESSION: 1. Acute oblique fracture of the distal fibula, better evaluated on Prior radiograph of the left ankle. No other acute traumatic injury about the left tibia and fibula.   Electronically Signed   By: Jeannine Boga M.D.   On: 07/17/2014 03:56   Dg Ankle Left Port  07/17/2014   CLINICAL DATA:  Ankle injury climbing stairs.  Deformity.  EXAM: PORTABLE LEFT ANKLE - 2 VIEW  COMPARISON:  No comparison studies available.  FINDINGS: Portable two view exam of the left ankle shows fracture dislocation. The talus is dislocated laterally relative to the tibial plafond. There is an associated fracture of the distal fibula.  IMPRESSION: Fracture dislocation of the ankle.   Electronically Signed   By: Misty Stanley M.D.   On: 07/17/2014 02:28    EKG: Independently reviewed.  Time spent: 60 min  Kennith Morss  M. [facility] Pager [phone]  If 7PM-7AM, please contact night-coverage [email or web address] Password [facility] 07/17/2014, 4:16 AM

## 2014-07-17 NOTE — Anesthesia Preprocedure Evaluation (Addendum)
Anesthesia Evaluation  Patient identified by MRN, date of birth, ID band Patient awake    Reviewed: Allergy & Precautions, H&P , NPO status , Patient's Chart, lab work & pertinent test results  Airway Mallampati: I TM Distance: >3 FB Neck ROM: Full    Dental  (+) Chipped, Poor Dentition, Missing   Pulmonary          Cardiovascular hypertension, Pt. on medications     Neuro/Psych    GI/Hepatic   Endo/Other    Renal/GU      Musculoskeletal   Abdominal   Peds  Hematology   Anesthesia Other Findings   Reproductive/Obstetrics                          Anesthesia Physical Anesthesia Plan  ASA: II and emergent  Anesthesia Plan: General   Post-op Pain Management:    Induction: Intravenous, Rapid sequence and Cricoid pressure planned  Airway Management Planned: Oral ETT  Additional Equipment:   Intra-op Plan:   Post-operative Plan: Extubation in OR  Informed Consent: I have reviewed the patients History and Physical, chart, labs and discussed the procedure including the risks, benefits and alternatives for the proposed anesthesia with the patient or authorized representative who has indicated his/her understanding and acceptance.     Plan Discussed with: CRNA and Surgeon  Anesthesia Plan Comments:         Anesthesia Quick Evaluation

## 2014-07-17 NOTE — Progress Notes (Signed)
Orthopedic Tech Progress Note Patient Details:  Jared Rhodes. 22-Jan-1951 [id number]  Ortho Devices Type of Ortho Device: Post (short) splint;Stirrup splint Ortho Device/Splint Location: short leg plaster posterior and stirrup splint is applied to left lower extremity   Ashok Cordia 07/17/2014, 4:47 AM

## 2014-07-17 NOTE — Transfer of Care (Signed)
Immediate Anesthesia Transfer of Care Note  Patient: Jared Rhodes.  Procedure(s) Performed: Procedure(s): IRRIGATION AND DEBRIDEMENT EXTREMITY (Left)  Patient Location: PACU  Anesthesia Type:General  Level of Consciousness: awake and sedated  Airway & Oxygen Therapy: Patient Spontanous Breathing and Patient connected to nasal cannula oxygen  Post-op Assessment: Report given to PACU RN, Post -op Vital signs reviewed and stable and Patient moving all extremities X 4  Post vital signs: Reviewed and stable  Complications: No apparent anesthesia complications

## 2014-07-17 NOTE — Progress Notes (Addendum)
[facility] PROGRESS NOTE  [name]. WIO:[id number] DOB: Aug 18, 1951 DOA: 07/17/2014 PCP: No PCP Per Patient I have seen and examined pt who is a 63yo sen in consultation this am by Dr Alcario Drought with h/o HTN admitted with an open ankle fracture s/p fall sustained after loosing his balance. Pt had been out drinking to celebrate his birthday.He was taken to OR per ortho for Irrigation and debridement of bone, skin, muscle, subcutaneous tissue associated with open fracture dislocation of left ankle. He is alert and lucid today and denies any c/o.    [facility] Pager [phone]. If 7PM-7AM, please contact night-coverage at [email or web address], password [facility] 07/17/2014, 1:12 PM  LOS: 0 days

## 2014-07-17 NOTE — ED Provider Notes (Signed)
CSN: [id number]     Arrival date & time 07/17/14  0058 History   First MD Initiated Contact with Patient 07/17/14 0125     Chief Complaint  Patient presents with  . Fall     (Consider location/radiation/quality/duration/timing/severity/associated sxs/prior Treatment) HPI Comments: Pt comes in with cc of fall. Was trying to get in a cab, lost balance over the curb, rolled his ankle and ended up having bleed to the leg. He has no medical hx, surgical hx. TDAP status is not uptodate. Pt denies pain elsewhere. No nausea, vomiting, headache, visual complains, seizures, altered mental status, loss of consciousness, new weakness, or numbness, no gait instability.   The history is provided by the patient.    Past Medical History  Diagnosis Date  . Hypertension    Past Surgical History  Procedure Laterality Date  . Tonsillectomy     Family History  Problem Relation Age of Onset  . Alcohol abuse Mother   . Hyperlipidemia Brother   . Heart disease Sister    History  Substance Use Topics  . Smoking status: Never Smoker   . Smokeless tobacco: Never Used  . Alcohol Use: Yes     Comment: every now and then    Review of Systems  Constitutional: Negative for activity change and appetite change.  Eyes: Negative for visual disturbance.  Respiratory: Negative for cough and shortness of breath.   Cardiovascular: Negative for chest pain.  Gastrointestinal: Negative for nausea and abdominal pain.  Genitourinary: Negative for dysuria.  Musculoskeletal: Negative for back pain.  Skin: Positive for wound.  Allergic/Immunologic: Negative for immunocompromised state.  Hematological: Does not bruise/bleed easily.      Allergies  Penicillins  Home Medications   Prior to Admission medications   Medication Sig Start Date End Date Taking? Authorizing Provider  multivitamin-iron-minerals-folic acid (CENTRUM) chewable tablet Chew 1 tablet by mouth daily.   Yes Historical Provider, MD   BP  143/91  Pulse 117  Temp(Src) 100.4 F (38 C) (Oral)  Resp 18  Ht 5\' 8"  (1.727 m)  Wt 275 lb (124.739 kg)  BMI 41.82 kg/m2  SpO2 97% Physical Exam  Nursing note and vitals reviewed. Constitutional: He is oriented to person, place, and time. He appears well-developed.  HENT:  Head: Normocephalic and atraumatic.  Eyes: Conjunctivae and EOM are normal. Pupils are equal, round, and reactive to light.  Neck: Normal range of motion. Neck supple.  Cardiovascular: Normal rate and regular rhythm.   Pulmonary/Chest: Effort normal and breath sounds normal.  Abdominal: Soft. Bowel sounds are normal. He exhibits no distension. There is no tenderness. There is no rebound and no guarding.  Musculoskeletal:  Pt with a medial laceration  -about 12 cm - with bleeding and exposed bone.  Neurological: He is alert and oriented to person, place, and time.  Skin: Skin is warm.    ED Course  Procedures (including critical care time) Labs Review Labs Reviewed  CBC WITH DIFFERENTIAL - Abnormal; Notable for the following:    HCT 38.1 (*)    RDW 16.3 (*)    All other components within normal limits  BASIC METABOLIC PANEL - Abnormal; Notable for the following:    Sodium 130 (*)    Chloride 92 (*)    CO2 18 (*)    Calcium 8.3 (*)    Anion gap 20 (*)    All other components within normal limits  ETHANOL - Abnormal; Notable for the following:    Alcohol, Ethyl (B) 314 (*)  All other components within normal limits  URINALYSIS, ROUTINE W REFLEX MICROSCOPIC  COMPREHENSIVE METABOLIC PANEL    Imaging Review Ct Ankle Left Wo Contrast  07/17/2014   CLINICAL DATA:  Complex ankle fractures.  EXAM: CT OF THE LEFT ANKLE WITHOUT CONTRAST  TECHNIQUE: Multidetector CT imaging was performed according to the standard protocol. Multiplanar CT image reconstructions were also generated.  COMPARISON:  Radiographs 07/17/2014.  FINDINGS: Interval reduction of the fracture dislocation. No fracture of the tibia is  identified. There is a comminuted fracture of the distal fibular shaft at and above the level of the ankle mortise. Medial ankle mortise widening likely due to deltoid ligament disruption. The distal fibula of maintains a normal relationship with the talus. Moderate cortical thickening and syndesmotic calcification along the lateral aspect of the tibia likely due to previous syndesmotic injury.  The talus is intact. The subtalar joints are maintained. Cystic type degenerative changes are noted in the calcaneus and cuboid.  There is air in the ankle joint and in the subcutaneous tissues most consistent with an open ankle fracture.  IMPRESSION: Comminuted distal fibular shaft fracture at and above the level of the ankle mortise with a small butterfly fragment.  Reduction of the tibiotalar dislocation. No tibial or talar fracture is identified.  Air in the joint and subcutaneous tissues consistent with an open fracture.  Suspect remote syndesmotic injury.   Electronically Signed   By: Kalman Jewels M.D.   On: 07/17/2014 11:35   Dg Knee Left Port  07/17/2014   CLINICAL DATA:  Fracture  EXAM: PORTABLE LEFT KNEE - 1-2 VIEW  COMPARISON:  None.  FINDINGS: There is no evidence of fracture, dislocation, or joint effusion. Mild degenerative changes present within the medial femoral tibial and patellofemoral joint space compartments. No soft tissue abnormality.  IMPRESSION: Negative.   Electronically Signed   By: Jeannine Boga M.D.   On: 07/17/2014 04:01   Dg Tibia/fibula Left Port  07/17/2014   CLINICAL DATA:  Fracture  EXAM: PORTABLE LEFT TIBIA AND FIBULA - 2 VIEW  COMPARISON:  Prior radiograph of the left ankle.  FINDINGS: Oblique fracture of the distal fibula is partially visualized, better seen on prior ankle radiograph. No other acute fracture seen about the tibia and fibula. Soft tissue swelling present about the ankle.  IMPRESSION: 1. Acute oblique fracture of the distal fibula, better evaluated on Prior  radiograph of the left ankle. No other acute traumatic injury about the left tibia and fibula.   Electronically Signed   By: Jeannine Boga M.D.   On: 07/17/2014 03:56   Dg Ankle Left Port  07/17/2014   CLINICAL DATA:  s/p reduction  EXAM: PORTABLE LEFT ANKLE - 2 VIEW  COMPARISON:  Prior radiograph performed earlier on the same day  FINDINGS: Splinting material overlies the ankle, limiting evaluation for fine osseous detail. Previous identified acute fracture of the distal left fibula again seen, now in gross anatomic alignment. The ankle joint is also in gross anatomic alignment status post reduction. Soft tissue swelling again seen. No new fracture.  IMPRESSION: 1. Left ankle in gross anatomic alignment status post splinting and reduction. 2. Distal fibular fracture in near anatomic alignment.   Electronically Signed   By: Jeannine Boga M.D.   On: 07/17/2014 05:26   Dg Ankle Left Port  07/17/2014   CLINICAL DATA:  Ankle injury climbing stairs.  Deformity.  EXAM: PORTABLE LEFT ANKLE - 2 VIEW  COMPARISON:  No comparison studies available.  FINDINGS: Portable  two view exam of the left ankle shows fracture dislocation. The talus is dislocated laterally relative to the tibial plafond. There is an associated fracture of the distal fibula.  IMPRESSION: Fracture dislocation of the ankle.   Electronically Signed   By: Misty Stanley M.D.   On: 07/17/2014 02:28     EKG Interpretation None      MDM   Final diagnoses:  Open fracture  Open left ankle fracture, type III, initial encounter    Pt comes in after he had a fall. Has a fracture dislocation - mild dislocation, of the ankle. The open fracture is large and type III. Dressing placed. Neurovascularly intact. No injury elsewhere. Ancef given. TDAP given. Pt has EToh on board. Orthopedic consulted, and Dr. Erlinda Hong to see patient and take him to the OR.   CRITICAL CARE Performed by: Varney Biles   Total critical care time: 45  minute  Critical care time was exclusive of separately billable procedures and treating other patients.  Critical care was necessary to treat or prevent imminent or life-threatening deterioration.  Critical care was time spent personally by me on the following activities: development of treatment plan with patient and/or surrogate as well as nursing, discussions with consultants, evaluation of patient's response to treatment, examination of patient, obtaining history from patient or surrogate, ordering and performing treatments and interventions, ordering and review of laboratory studies, ordering and review of radiographic studies, pulse oximetry and re-evaluation of patient's condition.    Varney Biles, MD 07/18/14 [phone]

## 2014-07-17 NOTE — Care Management Note (Signed)
CARE MANAGEMENT NOTE 07/17/2014  Patient:  Jared Rhodes, Jared Rhodes   Account Number:  [id number]  Date Initiated:  07/17/2014  Documentation initiated by:  Ricki Miller  Subjective/Objective Assessment:   63 yr old male s/p traumatic injury to left ankle-s/p I & D of bone,skin,muscle and subcutaneous tissue of left ankle joint     Action/Plan:   PT/OT eval  Paitent is NWB, will have to return to OR in 48-72 hrs. CM will continue to monitor.   Anticipated DC Date:     Anticipated DC Plan:        DC Planning Services  CM consult      Choice offered to / List presented to:             Status of service:  In process, will continue to follow

## 2014-07-17 NOTE — ED Notes (Signed)
Ortho md at bedside with ortho tech

## 2014-07-17 NOTE — Op Note (Signed)
   Date of Surgery: 07/17/2014  INDICATIONS: Jared Rhodes is a 63 y.o.-year-old male who sustained an open fracture dislocation of the left ankle joint as a result of a twisting injury from a misstep;  The patient did consent to the procedure after discussion of the risks and benefits.  PREOPERATIVE DIAGNOSIS: Type 3 open fracture dislocation of the left ankle joint  POSTOPERATIVE DIAGNOSIS: Same.  PROCEDURE: Irrigation and debridement of bone, skin, muscle, subcutaneous tissue associated with open fracture dislocation of left ankle  SURGEON: N. Eduard Roux, M.D.  ASSIST: None.  ANESTHESIA:  general  IV FLUIDS AND URINE: See anesthesia.  ESTIMATED BLOOD LOSS: Minimal mL.  IMPLANTS: None  DRAINS: None  COMPLICATIONS: None.  DESCRIPTION OF PROCEDURE: The patient received antibiotics and tetanus in the emergency room and was redosed prior to incision.  The patient was brought to the operating room and placed supine on the operating table.  The patient had been signed prior to the procedure and this was documented. The patient had the anesthesia placed by the anesthesiologist.  A time-out was performed to confirm that this was the correct patient, site, side and location. The patient had an SCD on the opposite lower extremity. The patient did receive antibiotics prior to the incision and was re-dosed during the procedure as needed at indicated intervals.  The patient had the operative extremity prepped and draped in the standard surgical fashion.    The patient had a traumatic wound on the medial aspect of the ankle that was transverse and measured approximately 10 cm long. The bony end of the medial malleolus and distal tibia were brought through the wound. The articular cartilage was inspected for damage and gross contamination. The ankle joint was also inspected for gross contamination. Sharp excisional debridement of the skin, muscle, subcutaneous tissue and bone was carried out using a  knife and rongeur. Once adequate debridement was achieved 6 L of normal saline was irrigated through the entire wound using cystoscopy tubing. Given the fact that the patient is a heavy smoker with potential for wound breakdown, the decision was made to not place hardware at this time and to do it in a delayed fashion. Hemostasis was then achieved. The ankle was reduced. The wound was closed loosely with 2-0 nylon sutures in a vertical mattress fashion. A sterile dressing was applied. The extremity was placed in a short leg splint. The patient was extubated and transferred to the PACU in stable condition.  POSTOPERATIVE PLAN: The patient will be nonweightbearing to the left lower extremity. He will be placed on CIWA protocol for his extensive drinking history.  We will obtain a CT scan of the left ankle to evaluate for articular damage to the ankle. We will plan bringing him back in 48-72 hours for another look at the traumatic wound. The decision will be made at that time to definitively fix the ankle or not.  Jared Cecil, MD [place] 6:32 AM

## 2014-07-18 LAB — COMPREHENSIVE METABOLIC PANEL
ALT: 31 U/L (ref 0–53)
AST: 28 U/L (ref 0–37)
Albumin: 3.1 g/dL — ABNORMAL LOW (ref 3.5–5.2)
Alkaline Phosphatase: 35 U/L — ABNORMAL LOW (ref 39–117)
Anion gap: 14 (ref 5–15)
BUN: 6 mg/dL (ref 6–23)
CO2: 26 mEq/L (ref 19–32)
Calcium: 8.3 mg/dL — ABNORMAL LOW (ref 8.4–10.5)
Chloride: 99 mEq/L (ref 96–112)
Creatinine, Ser: 0.75 mg/dL (ref 0.50–1.35)
GFR calc Af Amer: >90 mL/min (ref >90)
GFR calc non Af Amer: >90 mL/min (ref >90)
Glucose, Bld: 105 mg/dL — ABNORMAL HIGH (ref 70–99)
Potassium: 4.4 mEq/L (ref 3.7–5.3)
Sodium: 139 mEq/L (ref 137–147)
Total Bilirubin: 0.6 mg/dL (ref 0.3–1.2)
Total Protein: 6.8 g/dL (ref 6.0–8.3)

## 2014-07-18 NOTE — Anesthesia Postprocedure Evaluation (Signed)
Anesthesia Post Note  Patient: Jared Rhodes.  Procedure(s) Performed: Procedure(s) (LRB): IRRIGATION AND DEBRIDEMENT EXTREMITY (Left)  Anesthesia type: general  Patient location: PACU  Post pain: Pain level controlled  Post assessment: Patient's Cardiovascular Status Stable  Last Vitals:  Filed Vitals:   07/18/14 0544  BP: 146/91  Pulse: 103  Temp: 37.7 C  Resp: 20    Post vital signs: Reviewed and stable  Level of consciousness: sedated  Complications: No apparent anesthesia complications

## 2014-07-18 NOTE — Progress Notes (Signed)
[facility] PROGRESS NOTE  Jared Rhodes. VEL:[id number] DOB: 1951/08/26 DOA: 07/17/2014 PCP: No PCP Per Patient  Assessment/Plan: 1. Alcohol abuse -on CIWA protocol, no evidence of DTs 2. Open L. Ankle fracture -per ortho, s/p I&D on 7/25 - plan for repeat I&D and likely ORIF 7/27   Code Status: full Family Communication: none at bedside Disposition Plan:pending clinical course   Consultants:  none  Procedures: - s/p I&D 7/27   Antibiotics:  none   HPI/Subjective: Pt denies any new c/o. Sitting up in chair  Objective: Filed Vitals:   07/18/14 0544  BP: 146/91  Pulse: 103  Temp: 99.9 F (37.7 C)  Resp: 20    Intake/Output Summary (Last 24 hours) at 07/18/14 1310 Last data filed at 07/18/14 1250  Gross per 24 hour  Intake   1255 ml  Output   2700 ml  Net  -1445 ml   Filed Weights   07/17/14 0116  Weight: 124.739 kg (275 lb)    Exam:  General: alert & oriented x 3 In NAD Cardiovascular: RRR, nl S1 s2 Respiratory: CTAB Abdomen: soft +BS NT/ND, no masses palpable Extremities: L.leg with ankle ace wrapped/immobilized     Data Reviewed: Basic Metabolic Panel:  Recent Labs Lab 07/17/14 0120 07/18/14 0431  NA 130* 139  K 4.0 4.4  CL 92* 99  CO2 18* 26  GLUCOSE 94 105*  BUN 7 6  CREATININE 0.78 0.75  CALCIUM 8.3* 8.3*   Liver Function Tests:  Recent Labs Lab 07/18/14 0431  AST 28  ALT 31  ALKPHOS 35*  BILITOT 0.6  PROT 6.8  ALBUMIN 3.1*   No results found for this basename: LIPASE, AMYLASE,  in the last 168 hours No results found for this basename: AMMONIA,  in the last 168 hours CBC:  Recent Labs Lab 07/17/14 0120  WBC 7.2  NEUTROABS 3.8  HGB 13.1  HCT 38.1*  MCV 85.4  PLT 188   Cardiac Enzymes: No results found for this basename: CKTOTAL, CKMB, CKMBINDEX, TROPONINI,  in the last 168 hours BNP (last 3 results) No results found for this basename: PROBNP,  in the last 8760 hours CBG: No results found for  this basename: GLUCAP,  in the last 168 hours  No results found for this or any previous visit (from the past 240 hour(s)).   Studies: Ct Ankle Left Wo Contrast  07/17/2014   CLINICAL DATA:  Complex ankle fractures.  EXAM: CT OF THE LEFT ANKLE WITHOUT CONTRAST  TECHNIQUE: Multidetector CT imaging was performed according to the standard protocol. Multiplanar CT image reconstructions were also generated.  COMPARISON:  Radiographs 07/17/2014.  FINDINGS: Interval reduction of the fracture dislocation. No fracture of the tibia is identified. There is a comminuted fracture of the distal fibular shaft at and above the level of the ankle mortise. Medial ankle mortise widening likely due to deltoid ligament disruption. The distal fibula of maintains a normal relationship with the talus. Moderate cortical thickening and syndesmotic calcification along the lateral aspect of the tibia likely due to previous syndesmotic injury.  The talus is intact. The subtalar joints are maintained. Cystic type degenerative changes are noted in the calcaneus and cuboid.  There is air in the ankle joint and in the subcutaneous tissues most consistent with an open ankle fracture.  IMPRESSION: Comminuted distal fibular shaft fracture at and above the level of the ankle mortise with a small butterfly fragment.  Reduction of the tibiotalar dislocation. No tibial or talar fracture is identified.  Air in the joint and subcutaneous tissues consistent with an open fracture.  Suspect remote syndesmotic injury.   Electronically Signed   By: Kalman Jewels M.D.   On: 07/17/2014 11:35   Dg Knee Left Port  07/17/2014   CLINICAL DATA:  Fracture  EXAM: PORTABLE LEFT KNEE - 1-2 VIEW  COMPARISON:  None.  FINDINGS: There is no evidence of fracture, dislocation, or joint effusion. Mild degenerative changes present within the medial femoral tibial and patellofemoral joint space compartments. No soft tissue abnormality.  IMPRESSION: Negative.    Electronically Signed   By: Jeannine Boga M.D.   On: 07/17/2014 04:01   Dg Tibia/fibula Left Port  07/17/2014   CLINICAL DATA:  Fracture  EXAM: PORTABLE LEFT TIBIA AND FIBULA - 2 VIEW  COMPARISON:  Prior radiograph of the left ankle.  FINDINGS: Oblique fracture of the distal fibula is partially visualized, better seen on prior ankle radiograph. No other acute fracture seen about the tibia and fibula. Soft tissue swelling present about the ankle.  IMPRESSION: 1. Acute oblique fracture of the distal fibula, better evaluated on Prior radiograph of the left ankle. No other acute traumatic injury about the left tibia and fibula.   Electronically Signed   By: Jeannine Boga M.D.   On: 07/17/2014 03:56   Dg Ankle Left Port  07/17/2014   CLINICAL DATA:  s/p reduction  EXAM: PORTABLE LEFT ANKLE - 2 VIEW  COMPARISON:  Prior radiograph performed earlier on the same day  FINDINGS: Splinting material overlies the ankle, limiting evaluation for fine osseous detail. Previous identified acute fracture of the distal left fibula again seen, now in gross anatomic alignment. The ankle joint is also in gross anatomic alignment status post reduction. Soft tissue swelling again seen. No new fracture.  IMPRESSION: 1. Left ankle in gross anatomic alignment status post splinting and reduction. 2. Distal fibular fracture in near anatomic alignment.   Electronically Signed   By: Jeannine Boga M.D.   On: 07/17/2014 05:26   Dg Ankle Left Port  07/17/2014   CLINICAL DATA:  Ankle injury climbing stairs.  Deformity.  EXAM: PORTABLE LEFT ANKLE - 2 VIEW  COMPARISON:  No comparison studies available.  FINDINGS: Portable two view exam of the left ankle shows fracture dislocation. The talus is dislocated laterally relative to the tibial plafond. There is an associated fracture of the distal fibula.  IMPRESSION: Fracture dislocation of the ankle.   Electronically Signed   By: Misty Stanley M.D.   On: 07/17/2014 02:28     Scheduled Meds: .  ceFAZolin (ANCEF) IV  2 g Intravenous R7E  . folic acid  1 mg Oral Daily  . multivitamin with minerals  1 tablet Oral Daily  . multivitamin with minerals  1 tablet Oral Daily  . senna  1 tablet Oral BID  . thiamine  100 mg Oral Daily   Or  . thiamine  100 mg Intravenous Daily   Continuous Infusions: . sodium chloride 20 mL/hr at 07/18/14 0814    Principal Problem:   Type III open fracture dislocation of left ankle joint Active Problems:   Alcohol intoxication    Time spent: [facility] Pager [phone]. If 7PM-7AM, please contact night-coverage at [email or web address], password [facility] 07/18/2014, 1:10 PM  LOS: 1 day

## 2014-07-18 NOTE — Progress Notes (Signed)
   Subjective:  Patient reports pain as mild.  No events.  Objective:   VITALS:   Filed Vitals:   07/17/14 0807 07/17/14 1359 07/17/14 2053 07/18/14 0544  BP: 131/78 129/73 143/91 146/91  Pulse: 90 104 117 103  Temp: 97.9 F (36.6 C) 98.4 F (36.9 C) 100.4 F (38 C) 99.9 F (37.7 C)  TempSrc: Oral Oral Oral Oral  Resp: 18 18 18 20   Height:      Weight:      SpO2: 94% 97% 97% 97%    Splint intact Toes wwp, wiggling NVI   Lab Results  Component Value Date   WBC 7.2 07/17/2014   HGB 13.1 07/17/2014   HCT 38.1* 07/17/2014   MCV 85.4 07/17/2014   PLT 188 07/17/2014     Assessment/Plan:  1 Day Post-Op   - Up with PT - DVT ppx - SCDs, lovenox - NWB left lower extremity - Pain well controlled - CT reviewed - plan for repeat I&D and likely ORIF tomorrow - NPO after midnight  Marianna Payment 07/18/2014, 7:21 AM [phone]

## 2014-07-18 NOTE — Evaluation (Signed)
Physical Therapy Evaluation Patient Details Name: Jared Rhodes. MRN: [id number] DOB: 14-May-1951 Today's Date: 07/18/2014   History of Present Illness  63 y.o.-year-old male who sustained an open fracture dislocation of the left ankle joint as a result of a twisting injury from a misstep.  Sustained type 3 open fracture dislocation of the left ankle joint.  Underwent I&D of bone, skin, muscle, subcutaneous tissue associated with open fracture dislocation of left ankle.  Planned OR for 7/26 to I&D with possible ORIF   Clinical Impression  Pt presents with excellent motivation to progress back to independent.  Up in chair on arrival, able to demonstrate good strength for transfers and expect pt will do well following planned OR tomorrow.  PT will initiate care plan and ask ortho MD to update activity/WB following surgery.  Do not anticipate need for postacute PT but will consistently reassess and update recommendations as pt progresses.  Thanks.    Follow Up Recommendations No PT follow up;Supervision/Assistance - 24 hour    Equipment Recommendations  Rolling walker with 5" wheels    Recommendations for Other Services       Precautions / Restrictions Precautions Precautions: Fall Restrictions LLE Weight Bearing: Non weight bearing Other Position/Activity Restrictions: elevate limb; ice on x20-30 min/off 30 min      Mobility  Bed Mobility               General bed mobility comments: not assessed, pt up/ back to chair  Transfers Overall transfer level: Needs assistance Equipment used: Rolling walker (2 wheeled) Transfers: Sit to/from Stand Sit to Stand: Mod assist         General transfer comment: instructional cues, step by step, hand placement, manage leg to maintain NWB, pt quite pained, returned to sitting; able to transfer bed>bsc>chair with nursing, took 1st pain pill 1 hour prior;  Pt instructed to take pain meds as scheduled to keep pain in control with  mobility  Ambulation/Gait             General Gait Details: unable to assess due to pain  Stairs            Wheelchair Mobility    Modified Rankin (Stroke Patients Only)       Balance                                             Pertinent Vitals/Pain No pain until initiating mobility.  Premedicated 1 hour prior to eval, has been up with nursing and now appreciates value of pain medication.  Pt instructed to ask for meds as scheduled and PT will coordinate therapy to maximize effectiveness.    Home Living Family/patient expects to be discharged to:: Private residence Living Arrangements: Alone Available Help at Discharge: Family;Available 24 hours/day Type of Home: House Home Access: Stairs to enter Entrance Stairs-Rails: None Entrance Stairs-Number of Steps: 2 Home Layout: One level Home Equipment: None Additional Comments: going to sister's home after d/c, she is an Therapist, sports    Prior Function Level of Independence: Independent               Hand Dominance        Extremity/Trunk Assessment   Upper Extremity Assessment: Overall WFL for tasks assessed           Lower Extremity Assessment: LLE deficits/detail   LLE Deficits / Details: bulky  ACE wrap to leg     Communication   Communication: No difficulties  Cognition Arousal/Alertness: Awake/alert Behavior During Therapy: WFL for tasks assessed/performed Overall Cognitive Status: Within Functional Limits for tasks assessed                      General Comments      Exercises        Assessment/Plan    PT Assessment Patient needs continued PT services  PT Diagnosis Difficulty walking;Acute pain   PT Problem List Pain;Decreased knowledge of use of DME;Decreased knowledge of precautions;Decreased mobility;Decreased range of motion;Decreased strength  PT Treatment Interventions Modalities;Patient/family education;Therapeutic activities;Functional mobility  training;Stair training;Gait training;DME instruction   PT Goals (Current goals can be found in the Care Plan section) Acute Rehab PT Goals Patient Stated Goal: get back home PT Goal Formulation: With patient Time For Goal Achievement: 07/25/14 Potential to Achieve Goals: Good    Frequency Min 5X/week   Barriers to discharge Inaccessible home environment (sister has 2 STE, his home has 5) pt will go home with sister; must learn stairs prior    Co-evaluation               End of Session   Activity Tolerance: Patient limited by pain Patient left: in chair;with call bell/phone within reach;with family/visitor present Nurse Communication: Mobility status         Time: 1535-1600 PT Time Calculation (min): 25 min   Charges:   PT Evaluation $Initial PT Evaluation Tier I: 1 Procedure PT Treatments $Therapeutic Activity: 8-22 mins   PT G Codes:          Herbie Drape 07/18/2014, 4:04 PM

## 2014-07-19 ENCOUNTER — Inpatient Hospital Stay (HOSPITAL_COMMUNITY): Payer: BC Managed Care – PPO

## 2014-07-19 ENCOUNTER — Inpatient Hospital Stay (HOSPITAL_COMMUNITY): Payer: BC Managed Care – PPO | Admitting: Certified Registered"

## 2014-07-19 ENCOUNTER — Encounter (HOSPITAL_COMMUNITY)
Admission: EM | Disposition: A | Discharge status: Skilled Nursing Facility | Payer: Self-pay | Source: Home / Self Care | Attending: Orthopaedic Surgery

## 2014-07-19 ENCOUNTER — Encounter (HOSPITAL_COMMUNITY): Payer: BC Managed Care – PPO | Admitting: Certified Registered"

## 2014-07-19 ENCOUNTER — Encounter (HOSPITAL_COMMUNITY): Payer: Self-pay | Admitting: Certified Registered Nurse Anesthetist

## 2014-07-19 HISTORY — PX: ORIF ANKLE FRACTURE: SHX5408

## 2014-07-19 HISTORY — PX: I & D EXTREMITY: SHX5045

## 2014-07-19 HISTORY — PX: I&D EXTREMITY: SHX5045

## 2014-07-19 LAB — COMPREHENSIVE METABOLIC PANEL
ALK PHOS: 34 U/L — AB (ref 39–117)
ALT: 20 U/L (ref 0–53)
AST: 22 U/L (ref 0–37)
Albumin: 2.8 g/dL — ABNORMAL LOW (ref 3.5–5.2)
Anion gap: 13 (ref 5–15)
BUN: 5 mg/dL — ABNORMAL LOW (ref 6–23)
CALCIUM: 8.6 mg/dL (ref 8.4–10.5)
CO2: 26 mEq/L (ref 19–32)
Chloride: 98 mEq/L (ref 96–112)
Creatinine, Ser: 0.69 mg/dL (ref 0.50–1.35)
GFR calc non Af Amer: >90 mL/min (ref >90)
Glucose, Bld: 106 mg/dL — ABNORMAL HIGH (ref 70–99)
POTASSIUM: 4.1 meq/L (ref 3.7–5.3)
SODIUM: 137 meq/L (ref 137–147)
TOTAL PROTEIN: 6.7 g/dL (ref 6.0–8.3)
Total Bilirubin: 0.5 mg/dL (ref 0.3–1.2)

## 2014-07-19 LAB — CREATININE, SERUM
Creatinine, Ser: 0.76 mg/dL (ref 0.50–1.35)
GFR calc Af Amer: >90 mL/min (ref >90)
GFR calc non Af Amer: >90 mL/min (ref >90)

## 2014-07-19 LAB — CBC
HCT: 35.9 % — ABNORMAL LOW (ref 39.0–52.0)
Hemoglobin: 12 g/dL — ABNORMAL LOW (ref 13.0–17.0)
MCH: 29.8 pg (ref 26.0–34.0)
MCHC: 33.4 g/dL (ref 30.0–36.0)
MCV: 89.1 fL (ref 78.0–100.0)
PLATELETS: 157 10*3/uL (ref 150–400)
RBC: 4.03 MIL/uL — ABNORMAL LOW (ref 4.22–5.81)
RDW: 16.4 % — AB (ref 11.5–15.5)
WBC: 7.6 10*3/uL (ref 4.0–10.5)

## 2014-07-19 SURGERY — IRRIGATION AND DEBRIDEMENT EXTREMITY
Anesthesia: General | Site: Ankle | Laterality: Left

## 2014-07-19 MED ORDER — METOCLOPRAMIDE HCL 5 MG/ML IJ SOLN: 5.0000 mg | Freq: Three times a day (TID) | INTRAMUSCULAR | Status: DC | PRN

## 2014-07-19 MED ORDER — OXYCODONE HCL 5 MG/5ML PO SOLN: 5.0000 mg | Freq: Once | ORAL | Status: DC | PRN

## 2014-07-19 MED ORDER — MIDAZOLAM HCL 2 MG/2ML IJ SOLN
INTRAMUSCULAR | Status: AC
  Filled 2014-07-19: qty 2

## 2014-07-19 MED ORDER — SODIUM CHLORIDE 0.9 % IV SOLN: INTRAVENOUS | Status: DC

## 2014-07-19 MED ORDER — GLYCOPYRROLATE 0.2 MG/ML IJ SOLN
INTRAMUSCULAR | Status: AC
  Filled 2014-07-19: qty 3

## 2014-07-19 MED ORDER — OXYCODONE HCL 5 MG PO TABS: 5.0000 mg | ORAL_TABLET | Freq: Once | ORAL | Status: DC | PRN

## 2014-07-19 MED ORDER — FENTANYL CITRATE 0.05 MG/ML IJ SOLN
INTRAMUSCULAR | Status: AC
  Filled 2014-07-19: qty 5

## 2014-07-19 MED ORDER — METHOCARBAMOL 1000 MG/10ML IJ SOLN
500.0000 mg | Freq: Four times a day (QID) | INTRAVENOUS | Status: DC | PRN
  Filled 2014-07-19: qty 5

## 2014-07-19 MED ORDER — DIPHENHYDRAMINE HCL 12.5 MG/5ML PO ELIX: 25.0000 mg | ORAL_SOLUTION | ORAL | Status: DC | PRN

## 2014-07-19 MED ORDER — GLYCOPYRROLATE 0.2 MG/ML IJ SOLN
INTRAMUSCULAR | Status: DC | PRN
  Administered 2014-07-19: 0.4 mg via INTRAVENOUS

## 2014-07-19 MED ORDER — ONDANSETRON HCL 4 MG/2ML IJ SOLN
INTRAMUSCULAR | Status: AC
  Filled 2014-07-19: qty 2

## 2014-07-19 MED ORDER — LACTATED RINGERS IV SOLN
INTRAVENOUS | Status: DC | PRN
  Administered 2014-07-19 (×2): via INTRAVENOUS

## 2014-07-19 MED ORDER — POLYETHYLENE GLYCOL 3350 17 G PO PACK: 17.0000 g | PACK | Freq: Every day | ORAL | Status: DC | PRN

## 2014-07-19 MED ORDER — CHLORHEXIDINE GLUCONATE 4 % EX LIQD: 60.0000 mL | Freq: Once | CUTANEOUS | Status: DC

## 2014-07-19 MED ORDER — ONDANSETRON HCL 4 MG/2ML IJ SOLN: 4.0000 mg | Freq: Four times a day (QID) | INTRAMUSCULAR | Status: DC | PRN

## 2014-07-19 MED ORDER — HYDROMORPHONE HCL PF 1 MG/ML IJ SOLN
INTRAMUSCULAR | Status: AC
  Filled 2014-07-19: qty 1

## 2014-07-19 MED ORDER — MAGNESIUM CITRATE PO SOLN: 1.0000 | Freq: Once | ORAL | Status: AC | PRN

## 2014-07-19 MED ORDER — PROPOFOL 10 MG/ML IV BOLUS
INTRAVENOUS | Status: AC
  Filled 2014-07-19: qty 20

## 2014-07-19 MED ORDER — CEFAZOLIN SODIUM-DEXTROSE 2-3 GM-% IV SOLR
INTRAVENOUS | Status: AC
  Filled 2014-07-19: qty 50

## 2014-07-19 MED ORDER — OXYCODONE HCL 5 MG PO TABS: 5.0000 mg | ORAL_TABLET | ORAL | Status: DC | PRN

## 2014-07-19 MED ORDER — HYDROMORPHONE HCL PF 1 MG/ML IJ SOLN: 0.2500 mg | INTRAMUSCULAR | Status: DC | PRN

## 2014-07-19 MED ORDER — ONDANSETRON HCL 4 MG PO TABS: 4.0000 mg | ORAL_TABLET | Freq: Four times a day (QID) | ORAL | Status: DC | PRN

## 2014-07-19 MED ORDER — METOCLOPRAMIDE HCL 5 MG/ML IJ SOLN: 10.0000 mg | Freq: Once | INTRAMUSCULAR | Status: DC | PRN

## 2014-07-19 MED ORDER — LIDOCAINE HCL (CARDIAC) 20 MG/ML IV SOLN
INTRAVENOUS | Status: AC
  Filled 2014-07-19: qty 5

## 2014-07-19 MED ORDER — SODIUM CHLORIDE 0.9 % IR SOLN
Status: DC | PRN
  Administered 2014-07-19: 1000 mL

## 2014-07-19 MED ORDER — FENTANYL CITRATE 0.05 MG/ML IJ SOLN
INTRAMUSCULAR | Status: DC | PRN
  Administered 2014-07-19: 100 ug via INTRAVENOUS
  Administered 2014-07-19 (×2): 50 ug via INTRAVENOUS

## 2014-07-19 MED ORDER — METOCLOPRAMIDE HCL 10 MG PO TABS: 5.0000 mg | ORAL_TABLET | Freq: Three times a day (TID) | ORAL | Status: DC | PRN

## 2014-07-19 MED ORDER — NEOSTIGMINE METHYLSULFATE 10 MG/10ML IV SOLN
INTRAVENOUS | Status: AC
  Filled 2014-07-19: qty 1

## 2014-07-19 MED ORDER — LIDOCAINE HCL (CARDIAC) 20 MG/ML IV SOLN
INTRAVENOUS | Status: DC | PRN
  Administered 2014-07-19: 100 mg via INTRAVENOUS

## 2014-07-19 MED ORDER — DEXTROSE 5 % IV SOLN
10.0000 mg | INTRAVENOUS | Status: DC | PRN
  Administered 2014-07-19: 25 ug/min via INTRAVENOUS

## 2014-07-19 MED ORDER — SENNOSIDES-DOCUSATE SODIUM 8.6-50 MG PO TABS: 1.0000 | ORAL_TABLET | Freq: Every evening | ORAL | Status: DC | PRN

## 2014-07-19 MED ORDER — ENOXAPARIN SODIUM 40 MG/0.4ML ~~LOC~~ SOLN: 40.0000 mg | Freq: Every day | SUBCUTANEOUS | Status: DC

## 2014-07-19 MED ORDER — NEOSTIGMINE METHYLSULFATE 10 MG/10ML IV SOLN
INTRAVENOUS | Status: DC | PRN
  Administered 2014-07-19: 3 mg via INTRAVENOUS

## 2014-07-19 MED ORDER — ARTIFICIAL TEARS OP OINT
TOPICAL_OINTMENT | OPHTHALMIC | Status: DC | PRN
  Administered 2014-07-19: 1 via OPHTHALMIC

## 2014-07-19 MED ORDER — HYDROCODONE-ACETAMINOPHEN 5-325 MG PO TABS: 1.0000 | ORAL_TABLET | ORAL | Status: DC | PRN

## 2014-07-19 MED ORDER — ENOXAPARIN SODIUM 40 MG/0.4ML ~~LOC~~ SOLN
40.0000 mg | SUBCUTANEOUS | Status: DC
  Administered 2014-07-20 – 2014-07-26 (×7): 40 mg via SUBCUTANEOUS
  Filled 2014-07-19 (×8): qty 0.4

## 2014-07-19 MED ORDER — PROPOFOL 10 MG/ML IV BOLUS
INTRAVENOUS | Status: DC | PRN
  Administered 2014-07-19: 200 mg via INTRAVENOUS

## 2014-07-19 MED ORDER — SODIUM CHLORIDE 0.9 % IV SOLN
INTRAVENOUS | Status: DC
  Administered 2014-07-19 – 2014-07-20 (×2): via INTRAVENOUS

## 2014-07-19 MED ORDER — METHOCARBAMOL 500 MG PO TABS: 500.0000 mg | ORAL_TABLET | Freq: Four times a day (QID) | ORAL | Status: DC | PRN

## 2014-07-19 MED ORDER — MIDAZOLAM HCL 5 MG/5ML IJ SOLN
INTRAMUSCULAR | Status: DC | PRN
  Administered 2014-07-19: 2 mg via INTRAVENOUS

## 2014-07-19 MED ORDER — SORBITOL 70 % SOLN: 30.0000 mL | Freq: Every day | Status: DC | PRN

## 2014-07-19 MED ORDER — SENNA 8.6 MG PO TABS: 1.0000 | ORAL_TABLET | Freq: Two times a day (BID) | ORAL | Status: DC

## 2014-07-19 MED ORDER — MORPHINE SULFATE 2 MG/ML IJ SOLN: 1.0000 mg | INTRAMUSCULAR | Status: DC | PRN

## 2014-07-19 MED ORDER — SUCCINYLCHOLINE CHLORIDE 20 MG/ML IJ SOLN
INTRAMUSCULAR | Status: DC | PRN
  Administered 2014-07-19: 140 mg via INTRAVENOUS

## 2014-07-19 MED ORDER — PHENYLEPHRINE HCL 10 MG/ML IJ SOLN
INTRAMUSCULAR | Status: DC | PRN
  Administered 2014-07-19: 40 ug via INTRAVENOUS
  Administered 2014-07-19 (×2): 80 ug via INTRAVENOUS

## 2014-07-19 MED ORDER — CEFAZOLIN SODIUM-DEXTROSE 2-3 GM-% IV SOLR
2.0000 g | Freq: Four times a day (QID) | INTRAVENOUS | Status: AC
  Administered 2014-07-19 – 2014-07-20 (×3): 2 g via INTRAVENOUS
  Filled 2014-07-19 (×3): qty 50

## 2014-07-19 MED ORDER — ONDANSETRON HCL 4 MG/2ML IJ SOLN
INTRAMUSCULAR | Status: DC | PRN
  Administered 2014-07-19: 4 mg via INTRAVENOUS

## 2014-07-19 MED ORDER — ROCURONIUM BROMIDE 100 MG/10ML IV SOLN
INTRAVENOUS | Status: DC | PRN
  Administered 2014-07-19: 10 mg via INTRAVENOUS

## 2014-07-19 MED ORDER — ROCURONIUM BROMIDE 50 MG/5ML IV SOLN
INTRAVENOUS | Status: AC
  Filled 2014-07-19: qty 1

## 2014-07-19 MED ORDER — PHENYLEPHRINE 40 MCG/ML (10ML) SYRINGE FOR IV PUSH (FOR BLOOD PRESSURE SUPPORT)
PREFILLED_SYRINGE | INTRAVENOUS | Status: AC
  Filled 2014-07-19: qty 20

## 2014-07-19 MED ORDER — ARTIFICIAL TEARS OP OINT
TOPICAL_OINTMENT | OPHTHALMIC | Status: AC
  Filled 2014-07-19: qty 3.5

## 2014-07-19 MED ORDER — DEXTROSE 5 % IV SOLN: 3.0000 g | INTRAVENOUS | Status: DC

## 2014-07-19 SURGICAL SUPPLY — 98 items
BANDAGE ELASTIC 3 VELCRO ST LF (GAUZE/BANDAGES/DRESSINGS) IMPLANT
BANDAGE ELASTIC 4 VELCRO ST LF (GAUZE/BANDAGES/DRESSINGS) IMPLANT
BANDAGE ELASTIC 6 VELCRO ST LF (GAUZE/BANDAGES/DRESSINGS) ×1 IMPLANT
BIT DRILL QC 2.7 6.3IN  SHORT (BIT) ×1
BIT DRILL QC 2.7 6.3IN SHORT (BIT) IMPLANT
BLADE SURG 10 STRL SS (BLADE) ×1 IMPLANT
BNDG CMPR MED 15X6 ELC VLCR LF (GAUZE/BANDAGES/DRESSINGS) ×1
BNDG COHESIVE 1X5 TAN STRL LF (GAUZE/BANDAGES/DRESSINGS) IMPLANT
BNDG COHESIVE 4X5 TAN STRL (GAUZE/BANDAGES/DRESSINGS) ×1 IMPLANT
BNDG COHESIVE 6X5 TAN STRL LF (GAUZE/BANDAGES/DRESSINGS) ×2 IMPLANT
BNDG CONFORM 3 STRL LF (GAUZE/BANDAGES/DRESSINGS) IMPLANT
BNDG ELASTIC 6X15 VLCR STRL LF (GAUZE/BANDAGES/DRESSINGS) ×1 IMPLANT
BNDG GAUZE STRTCH 6 (GAUZE/BANDAGES/DRESSINGS) ×3 IMPLANT
CORDS BIPOLAR (ELECTRODE) IMPLANT
COVER SURGICAL LIGHT HANDLE (MISCELLANEOUS) ×2 IMPLANT
CUFF TOURNIQUET SINGLE 24IN (TOURNIQUET CUFF) IMPLANT
CUFF TOURNIQUET SINGLE 34IN LL (TOURNIQUET CUFF) ×3 IMPLANT
CUFF TOURNIQUET SINGLE 44IN (TOURNIQUET CUFF) IMPLANT
DRAPE C-ARM 42X72 X-RAY (DRAPES) ×2 IMPLANT
DRAPE C-ARMOR (DRAPES) ×1 IMPLANT
DRAPE EXTREMITY BILATERAL (DRAPE) IMPLANT
DRAPE IMP U-DRAPE 54X76 (DRAPES) ×2 IMPLANT
DRAPE INCISE IOBAN 66X45 STRL (DRAPES) ×4 IMPLANT
DRAPE SURG 17X23 STRL (DRAPES) IMPLANT
DRAPE U-SHAPE 47X51 STRL (DRAPES) ×3 IMPLANT
DRSG PAD ABDOMINAL 8X10 ST (GAUZE/BANDAGES/DRESSINGS) ×1 IMPLANT
DURAPREP 26ML APPLICATOR (WOUND CARE) ×2 IMPLANT
ELECT CAUTERY BLADE 6.4 (BLADE) ×2 IMPLANT
ELECT REM PT RETURN 9FT ADLT (ELECTROSURGICAL) ×2
ELECTRODE REM PT RTRN 9FT ADLT (ELECTROSURGICAL) ×1 IMPLANT
FACESHIELD WRAPAROUND (MASK) ×6 IMPLANT
FACESHIELD WRAPAROUND OR TEAM (MASK) ×1 IMPLANT
GAUZE XEROFORM 1X8 LF (GAUZE/BANDAGES/DRESSINGS) ×1 IMPLANT
GAUZE XEROFORM 5X9 LF (GAUZE/BANDAGES/DRESSINGS) ×2 IMPLANT
GLOVE BIO SURGEON STRL SZ 6 (GLOVE) ×2 IMPLANT
GLOVE BIO SURGEON STRL SZ 6.5 (GLOVE) ×3 IMPLANT
GLOVE BIOGEL PI IND STRL 6.5 (GLOVE) IMPLANT
GLOVE BIOGEL PI INDICATOR 6.5 (GLOVE) ×2
GLOVE SURG SS PI 7.5 STRL IVOR (GLOVE) ×2 IMPLANT
GLOVE SURG SYN 7.5  E (GLOVE) ×4
GLOVE SURG SYN 7.5 E (GLOVE) ×4 IMPLANT
GLOVE SURG SYN 7.5 PF PI (GLOVE) IMPLANT
GOWN STRL REIN XL XLG (GOWN DISPOSABLE) ×3 IMPLANT
GOWN STRL REUS W/ TWL LRG LVL3 (GOWN DISPOSABLE) ×2 IMPLANT
GOWN STRL REUS W/TWL LRG LVL3 (GOWN DISPOSABLE) ×4
HANDPIECE INTERPULSE COAX TIP (DISPOSABLE)
KIT BASIN OR (CUSTOM PROCEDURE TRAY) ×2 IMPLANT
KIT ROOM TURNOVER OR (KITS) ×2 IMPLANT
MANIFOLD NEPTUNE II (INSTRUMENTS) ×2 IMPLANT
NDL HYPO 25GX1X1/2 BEV (NEEDLE) IMPLANT
NEEDLE HYPO 25GX1X1/2 BEV (NEEDLE) IMPLANT
NS IRRIG 1000ML POUR BTL (IV SOLUTION) ×4 IMPLANT
PACK ORTHO EXTREMITY (CUSTOM PROCEDURE TRAY) ×2 IMPLANT
PAD ABD 8X10 STRL (GAUZE/BANDAGES/DRESSINGS) ×1 IMPLANT
PAD ARMBOARD 7.5X6 YLW CONV (MISCELLANEOUS) ×4 IMPLANT
PAD CAST 3X4 CTTN HI CHSV (CAST SUPPLIES) ×2 IMPLANT
PAD CAST 4YDX4 CTTN HI CHSV (CAST SUPPLIES) IMPLANT
PADDING CAST ABS 4INX4YD NS (CAST SUPPLIES)
PADDING CAST ABS COTTON 4X4 ST (CAST SUPPLIES) ×2 IMPLANT
PADDING CAST COTTON 3X4 STRL (CAST SUPPLIES)
PADDING CAST COTTON 4X4 STRL (CAST SUPPLIES) ×2
PADDING CAST COTTON 6X4 STRL (CAST SUPPLIES) ×1 IMPLANT
PADDING CAST SYN 6 (CAST SUPPLIES) ×1
PADDING CAST SYNTHETIC 4 (CAST SUPPLIES) ×1
PADDING CAST SYNTHETIC 4X4 STR (CAST SUPPLIES) IMPLANT
PADDING CAST SYNTHETIC 6X4 NS (CAST SUPPLIES) IMPLANT
PLATE FIBULA DISTAL 7 HOLE (Plate) ×1 IMPLANT
SCREW CORTICAL 3.5X34MM (Screw) ×1 IMPLANT
SCREW LOCK 3.5X14 (Screw) ×3 IMPLANT
SCREW NON LOCK 3.5X12 (Screw) ×4 IMPLANT
SCREW NONLOCK 3.5X18 (Screw) ×1 IMPLANT
SET HNDPC FAN SPRY TIP SCT (DISPOSABLE) IMPLANT
SPLINT FIBERGLASS 3X35 (CAST SUPPLIES) ×1 IMPLANT
SPONGE GAUZE 4X4 12PLY (GAUZE/BANDAGES/DRESSINGS) ×3 IMPLANT
SPONGE GAUZE 4X4 12PLY STER LF (GAUZE/BANDAGES/DRESSINGS) ×1 IMPLANT
SPONGE LAP 18X18 X RAY DECT (DISPOSABLE) ×4 IMPLANT
STOCKINETTE IMPERVIOUS 9X36 MD (GAUZE/BANDAGES/DRESSINGS) ×2 IMPLANT
SUCTION FRAZIER TIP 10 FR DISP (SUCTIONS) ×2 IMPLANT
SUT ETHILON 2 0 FS 18 (SUTURE) ×4 IMPLANT
SUT ETHILON 2 0 PSLX (SUTURE) ×1 IMPLANT
SUT ETHILON 3 0 FSL (SUTURE) ×3 IMPLANT
SUT ETHILON 3 0 PS 1 (SUTURE) ×2 IMPLANT
SUT VIC AB 0 CT1 27 (SUTURE) ×2
SUT VIC AB 0 CT1 27XBRD ANBCTR (SUTURE) IMPLANT
SUT VIC AB 2-0 CT1 27 (SUTURE) ×4
SUT VIC AB 2-0 CT1 36 (SUTURE) ×1 IMPLANT
SUT VIC AB 2-0 CT1 TAPERPNT 27 (SUTURE) IMPLANT
SUT VIC AB 2-0 FS1 27 (SUTURE) ×3 IMPLANT
SYR CONTROL 10ML LL (SYRINGE) IMPLANT
TOWEL OR 17X24 6PK STRL BLUE (TOWEL DISPOSABLE) ×2 IMPLANT
TOWEL OR 17X26 10 PK STRL BLUE (TOWEL DISPOSABLE) ×4 IMPLANT
TUBE ANAEROBIC SPECIMEN COL (MISCELLANEOUS) IMPLANT
TUBE CONNECTING 12X1/4 (SUCTIONS) ×2 IMPLANT
TUBE FEEDING 5FR 15 INCH (TUBING) IMPLANT
TUBING CYSTO DISP (UROLOGICAL SUPPLIES) ×2 IMPLANT
UNDERPAD 30X30 INCONTINENT (UNDERPADS AND DIAPERS) ×4 IMPLANT
WATER STERILE IRR 1000ML POUR (IV SOLUTION) ×2 IMPLANT
YANKAUER SUCT BULB TIP NO VENT (SUCTIONS) ×1 IMPLANT

## 2014-07-19 NOTE — Op Note (Addendum)
Date of Surgery: 07/19/2014  INDICATIONS: [name] is a 63 y.o.-year-old male who sustained an open left ankle fracture dislocation; he was indicated for open reduction and internal fixation and came to the operating room today for this procedure. The patient did consent to the procedure after discussion of the risks and benefits.  PREOPERATIVE DIAGNOSIS: open left lateral ankle fracture dislocation  POSTOPERATIVE DIAGNOSIS: Same.  PROCEDURE:  1. Open treatment of left lateral malleolar ankle fracture with internal fixation. CPT 510 [id number] 2. Irrigation and debridement of bone, muscle, skin associated with open fracture dislocation. CPT 11012 3. Open treatment of left syndesmosis injury with internal fixation. CPT [phone]  SURGEON: N. Eduard Roux, M.D.  ASSIST: none.  ANESTHESIA:  general  IV FLUIDS AND URINE: See anesthesia.  ESTIMATED BLOOD LOSS: minimal mL.  IMPLANTS: Tamala [employer] Nephew 7 hole distal fibular plate  COMPLICATIONS: None.  DESCRIPTION OF PROCEDURE: The patient was brought to the operating room and placed supine on the operating table.  The patient had been signed prior to the procedure and this was documented. The patient had the anesthesia placed by the anesthesiologist.  A nonsterile tourniquet was placed on the upper thigh.  The prep verification and incision time-outs were performed to confirm that this was the correct patient, site, side and location. The patient had an SCD on the opposite lower extremity. The patient did receive antibiotics prior to the incision and was re-dosed during the procedure as needed at indicated intervals.  The patient had the lower extremity prepped and draped in the standard surgical fashion.  The extremity was exsanguinated using an esmarch bandage and the tourniquet was inflated to 300 mm Hg.   We first began with a repeat washout and debridement of the medial wound. Sharp excisional debridement of skin, muscle, bone was performed using  a knife. The wound was once again thoroughly irrigated using normal saline and cystoscopy tubing. The wound was loosely approximated by using a far near near far suture configuration. We then turned our attention to operative fixation of the fibula fracture. The bony landmarks were palpated. A longitudinal incision based over the lateral aspect of the fibula was used. Blunt dissection was taken down to the level of the fascia. The superficial peroneal nerve was not encountered. The fascia was sharply incised in line with the incision. A large amount of fracture hematoma was expressed. The fracture site was exposed by elevating the periosteum. Entrapped periosteum was retrieved from the fracture site. Any organized hematoma was also irrigated out of the fracture site. The fracture did exhibit significant amount of comminution. Reduction by lag technique was not achievable. The decision was made to fix the fracture in a bridge fashion. With the fracture reduced a 7-hole distal fibular plate was sized to the fibula. This was confirmed on fluoroscopy. I then placed a nonlocking screw in the shaft of the fracture. I then placed a nonlocking screw in the distal portion of the fibula in order to suck the plate down onto the bone. After this I placed 2 more nonlocking shaft screws in the proximal portion of the fracture. I then placed 2 locking screws in the distal fibula. I then switched out the nonlocking screw in the distal fibula for a locking screw making sure that none of the screw heads penetrated the joint. I then performed a external rotation stress test and the medial clear space widening. I then placed a tricortical syndesmotic screw because of the medial wound and its potential to breakdown  and leaving exposed hardware. The wounds were then thoroughly irrigated again. Hemostasis was obtained. The lateral wound was closed in a layered fashion using 0 Vicryl for the fascia 2-0 Vicryl for the deep skin layer and  3-0 nylon for the skin. A sterile dressing was applied on the wounds. The right lower extremity was placed in a short-leg splint with the ankle at neutral flexion. The patient was extubated and transferred to the PACU in stable condition.  POSTOPERATIVE PLAN: Mr. Sagan will remain nonweightbearing on this leg for approximately 6-8 weeks; Mr. Hammitt will return for suture removal in 2 weeks.  He will be immobilized in a short leg splint and then transitioned to a short leg cast at his first follow up appointment.  Mr. Huntsman will receive DVT prophylaxis based on other medications, activity level, and risk ratio of bleeding to thrombosis.  Azucena Cecil, MD [place] 5:21 PM

## 2014-07-19 NOTE — Progress Notes (Signed)
Orthopedic Tech Progress Note Patient Details:  Jared Rhodes. 01-01-1951 [id number]  Ortho Devices Type of Ortho Device: Post (short) splint;Stirrup splint Ortho Device/Splint Location: trapeze bar patient helper Ortho Device/Splint Interventions: Application   Hildred Priest 07/19/2014, 8:39 PM

## 2014-07-19 NOTE — Progress Notes (Signed)
[facility] PROGRESS NOTE  Jared Rhodes. JJH:[id number] DOB: [birth year] DOA: 07/17/2014 PCP: No PCP Per Patient  Assessment/Plan: 1. Alcohol abuse -continue CIWA protocol, and follow 2. Open L. Ankle fracture -per ortho, s/p I&D on 7/25 - plan for repeat I&D and likely ORIF today 7/27   Code Status: full Family Communication: Disposition Plan:pending clinical course   Consultants:  none  Procedures: - s/p I&D 7/27   Antibiotics:  none   HPI/Subjective: Remains in surgery on followup recheck this afternoon  Objective: Filed Vitals:   07/19/14 1319  BP: 149/103  Pulse: 100  Temp: 98.5 F (36.9 C)  Resp: 18    Intake/Output Summary (Last 24 hours) at 07/19/14 1558 Last data filed at 07/19/14 1320  Gross per 24 hour  Intake   1618 ml  Output   2700 ml  Net  -1082 ml   Filed Weights   07/17/14 0116  Weight: 124.739 kg (275 lb)      Data Reviewed: Basic Metabolic Panel:  Recent Labs Lab 07/17/14 0120 07/18/14 0431 07/19/14 0645  NA 130* 139 137  K 4.0 4.4 4.1  CL 92* 99 98  CO2 18* 26 26  GLUCOSE 94 105* 106*  BUN 7 6 5*  CREATININE 0.78 0.75 0.69  CALCIUM 8.3* 8.3* 8.6   Liver Function Tests:  Recent Labs Lab 07/18/14 0431 07/19/14 0645  AST 28 22  ALT 31 20  ALKPHOS 35* 34*  BILITOT 0.6 0.5  PROT 6.8 6.7  ALBUMIN 3.1* 2.8*   No results found for this basename: LIPASE, AMYLASE,  in the last 168 hours No results found for this basename: AMMONIA,  in the last 168 hours CBC:  Recent Labs Lab 07/17/14 0120  WBC 7.2  NEUTROABS 3.8  HGB 13.1  HCT 38.1*  MCV 85.4  PLT 188   Cardiac Enzymes: No results found for this basename: CKTOTAL, CKMB, CKMBINDEX, TROPONINI,  in the last 168 hours BNP (last 3 results) No results found for this basename: PROBNP,  in the last 8760 hours CBG: No results found for this basename: GLUCAP,  in the last 168 hours  No results found for this or any previous visit (from the past 240  hour(s)).   Studies: No results found.  Scheduled Meds: . [MAR HOLD]  ceFAZolin (ANCEF) IV  2 g Intravenous Q6H  . [facility] HOLD] folic acid  1 mg Oral Daily  . [MAR HOLD] multivitamin with minerals  1 tablet Oral Daily  . [MAR HOLD] multivitamin with minerals  1 tablet Oral Daily  . [MAR HOLD] senna  1 tablet Oral BID  . [facility] HOLD] thiamine  100 mg Oral Daily   Or  . [facility] HOLD] thiamine  100 mg Intravenous Daily   Continuous Infusions: . sodium chloride 20 mL/hr at 07/18/14 8185    Principal Problem:   Type III open fracture dislocation of left ankle joint Active Problems:   Alcohol intoxication        [facility] Pager [phone]. If 7PM-7AM, please contact night-coverage at [email or web address], password [facility] 07/19/2014, 3:58 PM  LOS: 2 days

## 2014-07-19 NOTE — Discharge Instructions (Signed)
1. Keep splint clean and dry 2. Do not put any weight on the left foot 3. Take lovenox daily to prevent blood clots 4. Take pain meds as needed

## 2014-07-19 NOTE — Anesthesia Preprocedure Evaluation (Signed)
Anesthesia Evaluation  Patient identified by MRN, date of birth, ID band Patient awake    Reviewed: Allergy & Precautions, H&P , NPO status , Patient's Chart, lab work & pertinent test results, reviewed documented beta blocker date and time   Airway Mallampati: II TM Distance: >3 FB Neck ROM: full    Dental   Pulmonary neg pulmonary ROS,  breath sounds clear to auscultation        Cardiovascular hypertension, On Medications Rhythm:regular     Neuro/Psych negative neurological ROS  negative psych ROS   GI/Hepatic negative GI ROS, Neg liver ROS,   Endo/Other  Morbid obesity  Renal/GU negative Renal ROS  negative genitourinary   Musculoskeletal   Abdominal   Peds  Hematology negative hematology ROS (+)   Anesthesia Other Findings See surgeon's H&P   Reproductive/Obstetrics negative OB ROS                           Anesthesia Physical Anesthesia Plan  ASA: III  Anesthesia Plan: General   Post-op Pain Management:    Induction: Intravenous  Airway Management Planned: Video Laryngoscope Planned and Oral ETT  Additional Equipment:   Intra-op Plan:   Post-operative Plan: Extubation in OR  Informed Consent: I have reviewed the patients History and Physical, chart, labs and discussed the procedure including the risks, benefits and alternatives for the proposed anesthesia with the patient or authorized representative who has indicated his/her understanding and acceptance.   Dental Advisory Given  Plan Discussed with: CRNA and Surgeon  Anesthesia Plan Comments:         Anesthesia Quick Evaluation

## 2014-07-19 NOTE — Anesthesia Postprocedure Evaluation (Signed)
  Anesthesia Post-op Note  Patient: Jared Rhodes.  Procedure(s) Performed: Procedure(s): IRRIGATION AND DEBRIDEMENT EXTREMITY (Left) OPEN REDUCTION INTERNAL FIXATION (ORIF) ANKLE FRACTURE (Left)  Patient Location: PACU  Anesthesia Type:General  Level of Consciousness: awake  Airway and Oxygen Therapy: Patient Spontanous Breathing and Patient connected to face mask oxygen  Post-op Pain: none  Post-op Assessment: Post-op Vital signs reviewed, Patient's Cardiovascular Status Stable, Respiratory Function Stable, Patent Airway and No signs of Nausea or vomiting  Post-op Vital Signs: Reviewed and stable  Last Vitals:  Filed Vitals:   07/19/14 1715  BP: 144/97  Pulse:   Temp: 36.8 C  Resp: 21    Complications: No apparent anesthesia complications

## 2014-07-19 NOTE — H&P (Signed)
H&P update  The surgical history has been reviewed and remains accurate without interval change.  The patient was re-examined and patient's physiologic condition has not changed significantly in the last 30 days. The condition still exists that makes this procedure necessary. The treatment plan remains the same, without new options for care.  No new pharmacological allergies or types of therapy has been initiated that would change the plan or the appropriateness of the plan.  The patient and/or family understand the potential benefits and risks.  Jared Cecil, MD 07/19/2014 7:25 AM

## 2014-07-19 NOTE — Anesthesia Procedure Notes (Signed)
Procedure Name: Intubation Date/Time: 07/19/2014 3:17 PM Performed by: Ned Grace Pre-anesthesia Checklist: Patient identified, Emergency Drugs available, Timeout performed, Suction available and Patient being monitored Patient Re-evaluated:Patient Re-evaluated prior to inductionOxygen Delivery Method: Circle system utilized Preoxygenation: Pre-oxygenation with 100% oxygen Intubation Type: IV induction and Rapid sequence Laryngoscope size: GS #4 = GrII view with abundant redundant tissue. Grade View: Grade II Tube type: Oral Tube size: 7.5 mm Number of attempts: 1 Airway Equipment and Method: Rigid stylet and Video-laryngoscopy Placement Confirmation: ETT inserted through vocal cords under direct vision,  breath sounds checked- equal and bilateral and positive ETCO2 Secured at: 24 cm Tube secured with: Tape Dental Injury: Teeth and Oropharynx as per pre-operative assessment

## 2014-07-19 NOTE — Transfer of Care (Signed)
Immediate Anesthesia Transfer of Care Note  Patient: Jared Rhodes.  Procedure(s) Performed: Procedure(s): IRRIGATION AND DEBRIDEMENT EXTREMITY (Left) OPEN REDUCTION INTERNAL FIXATION (ORIF) ANKLE FRACTURE (Left)  Patient Location: PACU  Anesthesia Type:General  Level of Consciousness: awake, alert , oriented and patient cooperative  Airway & Oxygen Therapy: Patient Spontanous Breathing and Patient connected to face mask oxygen  Post-op Assessment: Report given to PACU RN, Post -op Vital signs reviewed and stable and Patient moving all extremities X 4  Post vital signs: Reviewed and stable  Complications: No apparent anesthesia complications

## 2014-07-20 ENCOUNTER — Encounter (HOSPITAL_COMMUNITY): Payer: Self-pay | Admitting: Orthopaedic Surgery

## 2014-07-20 LAB — COMPREHENSIVE METABOLIC PANEL
ALK PHOS: 34 U/L — AB (ref 39–117)
ALT: 16 U/L (ref 0–53)
ANION GAP: 13 (ref 5–15)
AST: 20 U/L (ref 0–37)
Albumin: 2.7 g/dL — ABNORMAL LOW (ref 3.5–5.2)
BILIRUBIN TOTAL: 0.4 mg/dL (ref 0.3–1.2)
BUN: 4 mg/dL — ABNORMAL LOW (ref 6–23)
CO2: 25 mEq/L (ref 19–32)
Calcium: 8.6 mg/dL (ref 8.4–10.5)
Chloride: 100 mEq/L (ref 96–112)
Creatinine, Ser: 0.69 mg/dL (ref 0.50–1.35)
GFR calc Af Amer: >90 mL/min (ref >90)
GFR calc non Af Amer: >90 mL/min (ref >90)
Glucose, Bld: 101 mg/dL — ABNORMAL HIGH (ref 70–99)
POTASSIUM: 4.2 meq/L (ref 3.7–5.3)
Sodium: 138 mEq/L (ref 137–147)
Total Protein: 7 g/dL (ref 6.0–8.3)

## 2014-07-20 LAB — CBC
HEMATOCRIT: 34.1 % — AB (ref 39.0–52.0)
HEMOGLOBIN: 11.2 g/dL — AB (ref 13.0–17.0)
MCH: 28.7 pg (ref 26.0–34.0)
MCHC: 32.8 g/dL (ref 30.0–36.0)
MCV: 87.4 fL (ref 78.0–100.0)
Platelets: 169 10*3/uL (ref 150–400)
RBC: 3.9 MIL/uL — ABNORMAL LOW (ref 4.22–5.81)
RDW: 16.3 % — AB (ref 11.5–15.5)
WBC: 6.2 10*3/uL (ref 4.0–10.5)

## 2014-07-20 MED ORDER — LORAZEPAM 2 MG/ML IJ SOLN
1.0000 mg | Freq: Four times a day (QID) | INTRAMUSCULAR | Status: AC | PRN
  Filled 2014-07-20: qty 1

## 2014-07-20 MED ORDER — LORAZEPAM 1 MG PO TABS: 1.0000 mg | ORAL_TABLET | Freq: Four times a day (QID) | ORAL | Status: AC | PRN

## 2014-07-20 NOTE — Evaluation (Signed)
Occupational Therapy Evaluation Patient Details Name: Jared Rhodes. MRN: [id number] DOB: December 14, 1951 Today's Date: 07/20/2014    History of Present Illness S/P L ankle open fx sustained in a fall with I & D and ORIF 07/19/14.  Pt has a hx of alcoholism.   Clinical Impression   Prior to admission, pt was independent in ADL and IADL.  He is able to perform bathing and grooming from a sitting position with set up.  Transfers and sit to stand require +2 assistance.  Pt fatigues easily. Pt does not have 24 hour care and will need post acute therapy prior to return home.  Will defer further OT to SNF.    Follow Up Recommendations  SNF;Supervision/Assistance - 24 hour    Equipment Recommendations       Recommendations for Other Services       Precautions / Restrictions Precautions Precautions: Fall Restrictions Weight Bearing Restrictions: Yes LLE Weight Bearing: Non weight bearing      Mobility Bed Mobility               General bed mobility comments: not assessed, pt up/ back to chair  Transfers     Transfers: Sit to/from Stand                Balance                                            ADL Overall ADL's : Needs assistance/impaired Eating/Feeding: Independent;Sitting   Grooming: Oral care;Wash/dry hands;Wash/dry face;Set up;Sitting   Upper Body Bathing: Set up;Sitting   Lower Body Bathing: Minimal assistance;Sitting/lateral leans   Upper Body Dressing : Set up;Sitting   Lower Body Dressing: +2 for physical assistance;Total assistance;Sit to/from stand                 General ADL Comments: Pt up in chair upon OTs arrival.  Did not transfer pt, but stood from recliner with +2 mod assist x 1.     Vision                     Perception     Praxis      Pertinent Vitals/Pain L LE with movement, premedicated, repositioned     Hand Dominance Right   Extremity/Trunk Assessment Upper Extremity  Assessment Upper Extremity Assessment: Overall WFL for tasks assessed   Lower Extremity Assessment Lower Extremity Assessment: Defer to PT evaluation       Communication Communication Communication: No difficulties   Cognition Arousal/Alertness: Awake/alert Behavior During Therapy: WFL for tasks assessed/performed Overall Cognitive Status: Within Functional Limits for tasks assessed                     General Comments       Exercises       Shoulder Instructions      Home Living Family/patient expects to be discharged to:: Skilled nursing facility Living Arrangements: Alone                                      Prior Functioning/Environment Level of Independence: Independent             OT Diagnosis: Generalized weakness;Acute pain   OT Problem List: Decreased strength;Decreased activity tolerance;Impaired balance (sitting and/or standing);Decreased knowledge of  use of DME or AE;Obesity;Pain   OT Treatment/Interventions:      OT Goals(Current goals can be found in the care plan section) Acute Rehab OT Goals Patient Stated Goal: agreeable to SNF rehab prior to return home OT Goal Formulation: With patient  OT Frequency:     Barriers to D/C:  does not have 24 hour care          Co-evaluation              End of Session Equipment Utilized During Treatment: Gait belt;Rolling walker Nurse Communication: Mobility status  Activity Tolerance: Patient limited by fatigue Patient left: in chair;with call bell/phone within reach;with nursing/sitter in room   Time: 7124-5809 OT Time Calculation (min): 21 min Charges:  OT General Charges $OT Visit: 1 Procedure OT Evaluation $Initial OT Evaluation Tier I: 1 Procedure OT Treatments $Self Care/Home Management : 8-22 mins G-Codes:    Malka So 07/20/2014, 9:52 AM [phone]

## 2014-07-20 NOTE — Progress Notes (Signed)
Patient's BP 150/100, HR 112, and a CIWA score of 3. Patient resting comfortably and has no complaints of pain or discomfort. Ativan order has expired at this time. MD notified, has reordered prn ativan and instructed to give 1 mg STAT. If BP stays elevated, MD instructed to re-notify. Patient refused IV ativan. Patient educated on the benefits of the medication and yet patient still refused. Patient did accept pain medication, despite no pain and states that "maybe the pain medicine might bring my pressure down". Patient then given 2 5/325 mg hydrocodone-acetaminophen. Patients blood pressure reduced to 145/95. Nursing will continue to monitor.

## 2014-07-20 NOTE — Progress Notes (Signed)
[facility] PROGRESS NOTE  Jared Rhodes. QIH:[id number] DOB: May 15, 1951 DOA: 07/17/2014 PCP: No PCP Per Patient  Assessment/Plan: 1. Alcohol abuse -on CIWA protocol, no evidence of DTs 2. Open L. Ankle fracture -per ortho, s/p I&D on 7/25 -Status post I&D again as well as ORIF on 07/28/2023 -Continue pain management   Code Status: full Family Communication: none at bedside Disposition Plan:pending clinical course   Consultants:  none  Procedures: -per ortho, s/p I&D on 7/25 PROCEDURE:  July 28, 2023 1. Open treatment of left ankle fracture with internal fixation. Bimalleolar CPT [id number]  2. Irrigation and debridement of bone, muscle, skin associated with open fracture dislocation. CPT 11012   Antibiotics:  none   HPI/Subjective: Sitting up in chair, states pain controlled.  Objective: Filed Vitals:   07/20/14 0651  BP: 122/73  Pulse: 101  Temp:   Resp:     Intake/Output Summary (Last 24 hours) at 07/20/14 1422 Last data filed at 07/27/14 2151  Gross per 24 hour  Intake   1200 ml  Output    450 ml  Net    750 ml   Filed Weights   07/17/14 0116  Weight: 124.739 kg (275 lb)    Exam:  General: alert & oriented x 3 In NAD Cardiovascular: RRR, nl S1 s2 Respiratory: CTAB Abdomen: soft +BS NT/ND, no masses palpable Extremities: L.leg with ankle ace wrapped/immobilized     Data Reviewed: Basic Metabolic Panel:  Recent Labs Lab 07/17/14 0120 07/18/14 0431 2014-07-27 0645 07-27-14 1910 07/20/14 0452  NA 130* 139 137  --  138  K 4.0 4.4 4.1  --  4.2  CL 92* 99 98  --  100  CO2 18* 26 26  --  25  GLUCOSE 94 105* 106*  --  101*  BUN 7 6 5*  --  4*  CREATININE 0.78 0.75 0.69 0.76 0.69  CALCIUM 8.3* 8.3* 8.6  --  8.6   Liver Function Tests:  Recent Labs Lab 07/18/14 0431 07/27/2014 0645 07/20/14 0452  AST 28 22 20   ALT 31 20 16   ALKPHOS 35* 34* 34*  BILITOT 0.6 0.5 0.4  PROT 6.8 6.7 7.0  ALBUMIN 3.1* 2.8* 2.7*   No results found for this  basename: LIPASE, AMYLASE,  in the last 168 hours No results found for this basename: AMMONIA,  in the last 168 hours CBC:  Recent Labs Lab 07/17/14 0120 07/27/2014 1910 07/20/14 0452  WBC 7.2 7.6 6.2  NEUTROABS 3.8  --   --   HGB 13.1 12.0* 11.2*  HCT 38.1* 35.9* 34.1*  MCV 85.4 89.1 87.4  PLT 188 157 169   Cardiac Enzymes: No results found for this basename: CKTOTAL, CKMB, CKMBINDEX, TROPONINI,  in the last 168 hours BNP (last 3 results) No results found for this basename: PROBNP,  in the last 8760 hours CBG: No results found for this basename: GLUCAP,  in the last 168 hours  No results found for this or any previous visit (from the past 240 hour(s)).   Studies: Dg Ankle Complete Left  July 27, 2014   CLINICAL DATA:  ORIF left ankle.  EXAM: LEFT ANKLE COMPLETE - 3+ VIEW  COMPARISON:  CT 07/17/2014.  FINDINGS: Patient status post open reduction internal fixation of distal fibular fracture with good anatomic alignment. Medial malleolus and posterior malleolus intact.  IMPRESSION: Patient status post open reduction internal fixation distal fibular fracture with good anatomic alignment.   Electronically Signed   By: Marcello Moores  Register   On: 2014/07/27 17:11  Dg C-arm 1-60 Min  07/19/2014   CLINICAL DATA:  ORIF left fibular fracture  EXAM: DG C-ARM 61-120 MIN  FLUOROSCOPY TIME:  1 min 10 seconds  COMPARISON:  CT ankle dated 07/17/2014  FINDINGS: Intraoperative radiographs provided during ORIF.  Lateral compression plate and screw fixation of the fibular shaft and lateral malleolus. Single syndesmotic screw.  Minimally displaced posterior fragments on the lateral view.  Ankle mortise is preserved.  Soft tissue defect/laceration overlying the medial malleolus.  IMPRESSION: Intraoperative radiographs, as above.   Electronically Signed   By: Julian Hy M.D.   On: 07/19/2014 17:58    Scheduled Meds: . enoxaparin (LOVENOX) injection  40 mg Subcutaneous Q24H  . folic acid  1 mg Oral Daily   . multivitamin with minerals  1 tablet Oral Daily  . senna  1 tablet Oral BID  . thiamine  100 mg Oral Daily   Or  . thiamine  100 mg Intravenous Daily   Continuous Infusions: . sodium chloride 125 mL/hr at 07/20/14 6979    Principal Problem:   Type III open fracture dislocation of left ankle joint Active Problems:   Alcohol intoxication    Time spent: [facility] Pager [phone]. If 7PM-7AM, please contact night-coverage at [email or web address], password [facility] 07/20/2014, 2:22 PM  LOS: 3 days

## 2014-07-20 NOTE — Progress Notes (Signed)
   Subjective:  Patient reports pain as mild.  No events.  Objective:   VITALS:   Filed Vitals:   07/19/14 2259 07/20/14 0010 07/20/14 0428 07/20/14 0651  BP: 181/93 149/87 169/105 122/73  Pulse:  95 94 101  Temp:  98.9 F (37.2 C) 100.2 F (37.9 C)   TempSrc:  Oral Oral   Resp:  16 16   Height:      Weight:      SpO2:  98% 99%     Neurologically intact Neurovascular intact Sensation intact distally Intact pulses distally Incision: dressing C/D/I and no drainage No cellulitis present Compartment soft   Lab Results  Component Value Date   WBC 6.2 07/20/2014   HGB 11.2* 07/20/2014   HCT 34.1* 07/20/2014   MCV 87.4 07/20/2014   PLT 169 07/20/2014     Assessment/Plan:  1 Day Post-Op   - Expected postop acute blood loss anemia - will monitor for symptoms - Up with PT/OT - DVT ppx - SCDs, ambulation, lovenox - NWB left lower extremity - Pain control - Discharge planning - patient lives alone and is an alcoholic, he would benefit from SNF placement in the acute postop period and then transition home, patient is in agreement  Marianna Payment 07/20/2014, 8:06 AM [phone]

## 2014-07-20 NOTE — Progress Notes (Signed)
Physical Therapy Treatment Patient Details Name: Jared Rhodes. MRN: [id number] DOB: 1951/06/28 Today's Date: 07/20/2014    History of Present Illness S/P L ankle open fx sustained in a fall with I & D and ORIF 07/19/14.  Pt has a hx of alcoholism.    PT Comments    Pt very motivated and attempting to A with transfer, however fatigues very quickly and has difficulty maintaining NWBing on L LE.  2nd person needed for transfer.  Pt will need SNF at D/C.  Will continue to follow.    Follow Up Recommendations  SNF     Equipment Recommendations  Rolling walker with 5" wheels;3in1 (PT)    Recommendations for Other Services       Precautions / Restrictions Precautions Precautions: Fall Restrictions Weight Bearing Restrictions: Yes LLE Weight Bearing: Non weight bearing    Mobility  Bed Mobility Overal bed mobility: Needs Assistance Bed Mobility: Supine to Sit     Supine to sit: Mod assist;HOB elevated     General bed mobility comments: A with supporting L LE and bringing trunk up to sitting.    Transfers Overall transfer level: Needs assistance Equipment used: Rolling walker (2 wheeled) Transfers: Sit to/from [facility] Sit to Stand: Mod assist Stand pivot transfers: Mod assist;+2 physical assistance       General transfer comment: cues for UE use and step-by-step through pivot to chair.  pt attempted to hop forward, but was only able to hop x2 before beginning to WB on L LE and leaning heavily on PT on L side.  2nd person needed to A with pivot to chair.    Ambulation/Gait                 Stairs            Wheelchair Mobility    Modified Rankin (Stroke Patients Only)       Balance Overall balance assessment: Needs assistance Sitting-balance support: No upper extremity supported;Feet supported Sitting balance-Leahy Scale: Good     Standing balance support: Bilateral upper extremity supported Standing balance-Leahy  Scale: Poor                      Cognition Arousal/Alertness: Awake/alert Behavior During Therapy: WFL for tasks assessed/performed Overall Cognitive Status: Within Functional Limits for tasks assessed                      Exercises General Exercises - Lower Extremity Straight Leg Raises: AROM;Left;5 reps    General Comments        Pertinent Vitals/Pain 7/10 during mobility.  "Throbs" when dependent.      Home Living Family/patient expects to be discharged to:: Skilled nursing facility Living Arrangements: Alone                  Prior Function Level of Independence: Independent          PT Goals (current goals can now be found in the care plan section) Acute Rehab PT Goals Patient Stated Goal: agreeable to SNF rehab prior to return home Time For Goal Achievement: 07/25/14 Potential to Achieve Goals: Good Progress towards PT goals: Progressing toward goals    Frequency  Min 3X/week    PT Plan Discharge plan needs to be updated;Frequency needs to be updated;Equipment recommendations need to be updated    Co-evaluation             End of Session Equipment Utilized During Treatment:  Gait belt Activity Tolerance: Patient limited by fatigue;Patient limited by pain Patient left: in chair;with call bell/phone within reach     Time: 0900-0925 PT Time Calculation (min): 25 min  Charges:  $Therapeutic Activity: 23-37 mins                    G CodesCatarina Hartshorn, [place] 07/20/2014, 10:45 AM

## 2014-07-21 DIAGNOSIS — I158 Other secondary hypertension: Secondary | ICD-10-CM

## 2014-07-21 MED ORDER — HYDRALAZINE HCL 20 MG/ML IJ SOLN
10.0000 mg | Freq: Once | INTRAMUSCULAR | Status: AC
  Administered 2014-07-21: 10 mg via INTRAVENOUS
  Filled 2014-07-21: qty 1

## 2014-07-21 NOTE — Care Management Note (Signed)
CARE MANAGEMENT NOTE 07/21/2014  Patient:  Jared Rhodes, Jared Rhodes   Account Number:  [id number]  Date Initiated:  07/17/2014  Documentation initiated by:  Ricki Miller  Subjective/Objective Assessment:   63 yr old male s/p traumatic injury to left ankle-s/p I & D of bone,skin,muscle and subcutaneous tissue of left ankle joint     Action/Plan:   PT/OT eval  Paitent is NWB, will have to return to OR in 48-72 hrs. CM will continue to monitor.  Patient will require shortterm rehab at [facility]. Social worker is aware.Plan [facility].   Anticipated DC Date:  07/21/2014   Anticipated DC Plan:  SKILLED NURSING FACILITY  In-house referral  Clinical Social Worker      DC Planning Services  CM consult      Choice offered to / List presented to:             Status of service:  In process, will continue to follow Medicare Important Message given?   (If response is "NO", the following Medicare IM given date fields will be blank) Date Medicare IM given:   Medicare IM given by:   Date Additional Medicare IM given:   Additional Medicare IM given by:    Discharge Disposition:  [place]  Per UR Regulation:  Reviewed for med. necessity/level of care/duration of stay

## 2014-07-21 NOTE — Progress Notes (Signed)
   Subjective:  Patient reports pain as mild.  No events.  Objective:   VITALS:   Filed Vitals:   07/20/14 2024 07/20/14 2318 07/21/14 0540 07/21/14 1341  BP: 150/100 148/95 176/100 130/85  Pulse: 112  116 98  Temp: 99.3 F (37.4 C)  99.3 F (37.4 C) 98.9 F (37.2 C)  TempSrc: Oral  Oral Oral  Resp: 20  20 18   Height:      Weight:      SpO2: 95%  94% 98%    Neurologically intact Neurovascular intact Sensation intact distally Intact pulses distally Incision: dressing C/D/I and no drainage No cellulitis present Compartment soft   Lab Results  Component Value Date   WBC 6.2 07/20/2014   HGB 11.2* 07/20/2014   HCT 34.1* 07/20/2014   MCV 87.4 07/20/2014   PLT 169 07/20/2014     Assessment/Plan:  2 Days Post-Op   - SNF pending - patient pleasant and does not appear to be in withdrawal - continue PT  Marianna Payment 07/21/2014, 2:05 PM [phone]

## 2014-07-21 NOTE — Progress Notes (Signed)
CSW has completed FL2 and PASARR. Pt wants to go to [employer]. Auth submitted.   Juliann Mule [phone]

## 2014-07-21 NOTE — Progress Notes (Signed)
[facility] PROGRESS NOTE  [name]. JSE:[id number] DOB: Dec 30, 1950 DOA: 07/17/2014 PCP: No PCP Per Patient  Assessment/Plan: 1. Alcohol abuse -per pt, drinks and smokes on weekends, no withdrawal symptoms noted - d/c CIWA protocol  2. Open L. Ankle fracture -per ortho, s/p I&D on 7/25 -Status post I&D again as well as ORIF on 7/27 -Continue pain management  HTN May be due to pain/ stress- will need to have it check regularly as outpt to determine if he has essential HTN - recommend checking QOD at nursing facility   Code Status: full Family Communication: none at bedside Disposition Plan:pending clinical course   Consultants:  none  Procedures: -per ortho, s/p I&D on 7/25 PROCEDURE:  7/27 1. Open treatment of left ankle fracture with internal fixation. Bimalleolar CPT [id number]  2. Irrigation and debridement of bone, muscle, skin associated with open fracture dislocation. CPT 11012   Antibiotics:  none   HPI/Subjective: Sitting up in chair, states pain controlled.  Objective: Filed Vitals:   07/21/14 1341  BP: 130/85  Pulse: 98  Temp: 98.9 F (37.2 C)  Resp: 18    Intake/Output Summary (Last 24 hours) at 07/21/14 1726 Last data filed at 07/21/14 1712  Gross per 24 hour  Intake    480 ml  Output   2500 ml  Net  -2020 ml   Filed Weights   07/17/14 0116  Weight: 124.739 kg (275 lb)    Exam:  General: alert & oriented x 3 In NAD Cardiovascular: RRR, nl S1 s2 Respiratory: CTAB Abdomen: soft +BS NT/ND, no masses palpable Extremities: L.leg with ankle ace wrapped/immobilized     Data Reviewed: Basic Metabolic Panel:  Recent Labs Lab 07/17/14 0120 07/18/14 0431 07/19/14 0645 07/19/14 1910 07/20/14 0452  NA 130* 139 137  --  138  K 4.0 4.4 4.1  --  4.2  CL 92* 99 98  --  100  CO2 18* 26 26  --  25  GLUCOSE 94 105* 106*  --  101*  BUN 7 6 5*  --  4*  CREATININE 0.78 0.75 0.69 0.76 0.69  CALCIUM 8.3* 8.3* 8.6  --  8.6    Liver Function Tests:  Recent Labs Lab 07/18/14 0431 07/19/14 0645 07/20/14 0452  AST 28 22 20   ALT 31 20 16   ALKPHOS 35* 34* 34*  BILITOT 0.6 0.5 0.4  PROT 6.8 6.7 7.0  ALBUMIN 3.1* 2.8* 2.7*   No results found for this basename: LIPASE, AMYLASE,  in the last 168 hours No results found for this basename: AMMONIA,  in the last 168 hours CBC:  Recent Labs Lab 07/17/14 0120 07/19/14 1910 07/20/14 0452  WBC 7.2 7.6 6.2  NEUTROABS 3.8  --   --   HGB 13.1 12.0* 11.2*  HCT 38.1* 35.9* 34.1*  MCV 85.4 89.1 87.4  PLT 188 157 169   Cardiac Enzymes: No results found for this basename: CKTOTAL, CKMB, CKMBINDEX, TROPONINI,  in the last 168 hours BNP (last 3 results) No results found for this basename: PROBNP,  in the last 8760 hours CBG: No results found for this basename: GLUCAP,  in the last 168 hours  No results found for this or any previous visit (from the past 240 hour(s)).   Studies: No results found.  Scheduled Meds: . enoxaparin (LOVENOX) injection  40 mg Subcutaneous Q24H  . folic acid  1 mg Oral Daily  . multivitamin with minerals  1 tablet Oral Daily  . senna  1 tablet  Oral BID  . thiamine  100 mg Oral Daily   Continuous Infusions: . sodium chloride Stopped (07/20/14 1700)    Principal Problem:   Type III open fracture dislocation of left ankle joint Active Problems:   Alcohol intoxication    Time spent: 25    [facility] Pager [phone]. If 7PM-7AM, please contact night-coverage at [email or web address], password [email or web address] 07/21/2014, 5:26 PM  LOS: 4 days

## 2014-07-22 NOTE — Progress Notes (Signed)
Physical Therapy Treatment Patient Details Name: Jared Rhodes. MRN: [id number] DOB: 01-16-1951 Today's Date: 07/22/2014    History of Present Illness S/P L ankle open fx sustained in a fall with I & D and ORIF 07/19/14.  Pt has a hx of alcoholism.    PT Comments    Pt progressed very nicely with bed mobility. Pt is very motivated to get up and walking. Pt was able to amb much further this treatment with proper weight on RLE and NO weight on LLE. Pt had good control of posture and RW, pt just limited by fatigue after amb 25'. Continue to recommend SNF for ongoing Physical Therapy.     Follow Up Recommendations  SNF     Equipment Recommendations  Rolling walker with 5" wheels;3in1 (PT)    Recommendations for Other Services       Precautions / Restrictions Precautions Precautions: Fall Restrictions LLE Weight Bearing: Non weight bearing    Mobility  Bed Mobility Overal bed mobility: Modified Independent                Transfers Overall transfer level: Needs assistance Equipment used: Rolling walker (2 wheeled) Transfers: Sit to/from Stand Sit to Stand: Min guard         General transfer comment: min guard for safety and to make sure pt shifts weight properly over RLE.   Ambulation/Gait Ambulation/Gait assistance: Min guard Ambulation Distance (Feet): 25 Feet Assistive device: Rolling walker (2 wheeled)       General Gait Details: good control of walker and able to not put any weight on LLE.    Stairs            Wheelchair Mobility    Modified Rankin (Stroke Patients Only)       Balance                                    Cognition Arousal/Alertness: Awake/alert Behavior During Therapy: WFL for tasks assessed/performed Overall Cognitive Status: Within Functional Limits for tasks assessed                      Exercises      General Comments        Pertinent Vitals/Pain no apparent distress. Pt  repositioned for comfort in recliner.     Home Living                      Prior Function            PT Goals (current goals can now be found in the care plan section) Progress towards PT goals: Progressing toward goals    Frequency  Min 3X/week    PT Plan      Co-evaluation             End of Session Equipment Utilized During Treatment: Gait belt Activity Tolerance: Patient tolerated treatment well Patient left: in chair;with call bell/phone within reach     Time: 1006-1015 PT Time Calculation (min): 9 min  Charges:                       G Codes:      BRASFIELD,Lester Crickenberger,SPTA 07/22/2014, 12:20 PM

## 2014-07-22 NOTE — Progress Notes (Signed)
   Subjective:  Patient reports pain as mild.  No events.  Objective:   VITALS:   Filed Vitals:   07/21/14 0540 07/21/14 1341 07/21/14 2024 07/22/14 0452  BP: 176/100 130/85 155/102 136/93  Pulse: 116 98 101 85  Temp: 99.3 F (37.4 C) 98.9 F (37.2 C) 99.4 F (37.4 C) 98.4 F (36.9 C)  TempSrc: Oral Oral Oral Oral  Resp: 20 18 18 18   Height:      Weight:      SpO2: 94% 98% 98% 97%    Splint c/d/i   Lab Results  Component Value Date   WBC 6.2 07/20/2014   HGB 11.2* 07/20/2014   HCT 34.1* 07/20/2014   MCV 87.4 07/20/2014   PLT 169 07/20/2014     Assessment/Plan:  3 Days Post-Op   - SNF today  Marianna Payment 07/22/2014, 8:14 AM [phone]

## 2014-07-22 NOTE — Progress Notes (Signed)
Seen and agreed 07/22/2014 Jacqualyn Posey PTA [phone] pager [phone] office

## 2014-07-22 NOTE — Consult Note (Addendum)
[facility] Consult note  [facility]. DGL:[id number] DOB: 06/15/1951 DOA: 07/17/2014 PCP: No PCP Per Patient  Assessment/Plan: 1. Alcohol use- intoxicated on admission -per pt, drinks and smokes only on weekends, no withdrawal symptoms noted - d/c CIWA protocol  2. Open L. Ankle fracture -per ortho, s/p I&D on 7/25 -Status post I&D again as well as ORIF on 7/27  3. HTN May be due to pain/ stress- will need to have it check regularly as outpt to determine if he has essential HTN - recommend checking QOD at nursing facility - have asked social services to provide him with health connect number to help him find a PCP   Code Status: full Family Communication: none at bedside Disposition Plan: d/c to SNF per ortho   Consultants:  none  Procedures: -per ortho, s/p I&D on 7/25 PROCEDURE:  7/27 1. Open treatment of left ankle fracture with internal fixation. Bimalleolar CPT [id number]  2. Irrigation and debridement of bone, muscle, skin associated with open fracture dislocation. CPT 11012   Antibiotics:  none   HPI/Subjective: No complaints- wants to find a PCP and asking for help with the process  Objective: Filed Vitals:   07/22/14 0452  BP: 136/93  Pulse: 85  Temp: 98.4 F (36.9 C)  Resp: 18    Intake/Output Summary (Last 24 hours) at 07/22/14 1308 Last data filed at 07/22/14 0930  Gross per 24 hour  Intake    240 ml  Output   1750 ml  Net  -1510 ml   Filed Weights   07/17/14 0116  Weight: 124.739 kg (275 lb)    Exam:  General: alert & oriented x 3 In NAD Cardiovascular: RRR, nl S1 s2 Respiratory: CTAB Abdomen: soft +BS NT/ND, no masses palpable Extremities: L.leg with ankle ace wrapped/immobilized     Data Reviewed: Basic Metabolic Panel:  Recent Labs Lab 07/17/14 0120 07/18/14 0431 07/19/14 0645 07/19/14 1910 07/20/14 0452  NA 130* 139 137  --  138  K 4.0 4.4 4.1  --  4.2  CL 92* 99 98  --  100  CO2 18* 26 26  --  25   GLUCOSE 94 105* 106*  --  101*  BUN 7 6 5*  --  4*  CREATININE 0.78 0.75 0.69 0.76 0.69  CALCIUM 8.3* 8.3* 8.6  --  8.6   Liver Function Tests:  Recent Labs Lab 07/18/14 0431 07/19/14 0645 07/20/14 0452  AST 28 22 20   ALT 31 20 16   ALKPHOS 35* 34* 34*  BILITOT 0.6 0.5 0.4  PROT 6.8 6.7 7.0  ALBUMIN 3.1* 2.8* 2.7*   No results found for this basename: LIPASE, AMYLASE,  in the last 168 hours No results found for this basename: AMMONIA,  in the last 168 hours CBC:  Recent Labs Lab 07/17/14 0120 07/19/14 1910 07/20/14 0452  WBC 7.2 7.6 6.2  NEUTROABS 3.8  --   --   HGB 13.1 12.0* 11.2*  HCT 38.1* 35.9* 34.1*  MCV 85.4 89.1 87.4  PLT 188 157 169   Cardiac Enzymes: No results found for this basename: CKTOTAL, CKMB, CKMBINDEX, TROPONINI,  in the last 168 hours BNP (last 3 results) No results found for this basename: PROBNP,  in the last 8760 hours CBG: No results found for this basename: GLUCAP,  in the last 168 hours  No results found for this or any previous visit (from the past 240 hour(s)).   Studies: No results found.  Scheduled Meds: . enoxaparin (LOVENOX) injection  40 mg Subcutaneous Q24H  . folic acid  1 mg Oral Daily  . multivitamin with minerals  1 tablet Oral Daily  . senna  1 tablet Oral BID  . thiamine  100 mg Oral Daily   Continuous Infusions: . sodium chloride Stopped (07/20/14 1700)    Principal Problem:   Type III open fracture dislocation of left ankle joint Active Problems:   HTN (hypertension)   Severe obesity (BMI >= 40)   Alcohol intoxication    Time spent: [facility] Financial [email or web address], password [facility] 07/22/2014, 1:08 PM  LOS: 5 days

## 2014-07-22 NOTE — Discharge Summary (Addendum)
Physician Discharge Summary      Patient ID: Jared Rhodes. MRN: [id number] DOB/AGE: May 13, 1951 63 y.o.  Admit date: 07/17/2014 Discharge date: 07/26/2014  Admission Diagnoses:  Type III open fracture dislocation of left ankle joint  Discharge Diagnoses:  Principal Problem:   Type III open fracture dislocation of left ankle joint Active Problems:   HTN (hypertension)   Severe obesity (BMI >= 40)   Alcohol intoxication   Past Medical History  Diagnosis Date  . Hypertension     Surgeries: Procedure(s): IRRIGATION AND DEBRIDEMENT EXTREMITY OPEN REDUCTION INTERNAL FIXATION (ORIF) ANKLE FRACTURE on 07/17/2014 - 07/19/2014   Consultants (if any):    Discharged Condition: Improved  Hospital Course: Jared Rhodes. is an 63 y.o. male who was admitted 07/17/2014 with a diagnosis of Type III open fracture dislocation of left ankle joint and went to the operating room on 07/17/2014 - 07/19/2014 and underwent the above named procedures.    He was given perioperative antibiotics:      Anti-infectives   Start     Dose/Rate Route Frequency Ordered Stop   07/20/14 0600  ceFAZolin (ANCEF) 3 g in dextrose 5 % 50 mL IVPB  Status:  Discontinued     3 g 160 mL/hr over 30 Minutes Intravenous On call to O.R. 07/19/14 1802 07/19/14 1843   07/19/14 2100  ceFAZolin (ANCEF) IVPB 2 g/50 mL premix     2 g 100 mL/hr over 30 Minutes Intravenous Every 6 hours 07/19/14 1802 07/20/14 0837   07/17/14 1200  ceFAZolin (ANCEF) IVPB 2 g/50 mL premix  Status:  Discontinued     2 g 100 mL/hr over 30 Minutes Intravenous Every 6 hours 07/17/14 0809 07/19/14 1856   07/17/14 0145  ceFAZolin (ANCEF) 3 g in dextrose 5 % 50 mL IVPB     3 g 160 mL/hr over 30 Minutes Intravenous  Once 07/17/14 0135 07/17/14 0426    .  He was given sequential compression devices, early ambulation, and lovenox for DVT prophylaxis.  He benefited maximally from the hospital stay and there were no complications.  He was  able to perform PT. He was discharged to a SNF due to lack of social support.  No changes, no issues.  Ready for discharge on 07/26/2014.  Recent vital signs:  Filed Vitals:   07/26/14 0639  BP: 147/94  Pulse: 97  Temp: 99.4 F (37.4 C)  Resp: 18    Recent laboratory studies:  Lab Results  Component Value Date   HGB 11.2* 07/20/2014   HGB 12.0* 07/19/2014   HGB 13.1 07/17/2014   Lab Results  Component Value Date   WBC 6.2 07/20/2014   PLT 169 07/20/2014   Lab Results  Component Value Date   INR 1.06 01/30/2014   Lab Results  Component Value Date   NA 138 07/20/2014   K 4.2 07/20/2014   CL 100 07/20/2014   CO2 25 07/20/2014   BUN 4* 07/20/2014   CREATININE 0.70 07/26/2014   GLUCOSE 101* 07/20/2014    Discharge Medications:     Medication List         enoxaparin 40 MG/0.4ML injection  Commonly known as:  LOVENOX  Inject 0.4 mLs (40 mg total) into the skin daily.     multivitamin-iron-minerals-folic acid chewable tablet  Chew 1 tablet by mouth daily.     oxyCODONE 5 MG immediate release tablet  Commonly known as:  Oxy IR/ROXICODONE  Take 1-3 tablets (5-15 mg total) by mouth every 4 (four)  hours as needed.     senna-docusate 8.6-50 MG per tablet  Commonly known as:  SENOKOT S  Take 1 tablet by mouth at bedtime as needed.        Diagnostic Studies: Dg Ankle Complete Left  2014/08/01   CLINICAL DATA:  ORIF left ankle.  EXAM: LEFT ANKLE COMPLETE - 3+ VIEW  COMPARISON:  CT 07/17/2014.  FINDINGS: Patient status post open reduction internal fixation of distal fibular fracture with good anatomic alignment. Medial malleolus and posterior malleolus intact.  IMPRESSION: Patient status post open reduction internal fixation distal fibular fracture with good anatomic alignment.   Electronically Signed   By: Marcello Moores  Register   On: August 01, 2014 17:11   Ct Ankle Left Wo Contrast  07/17/2014   CLINICAL DATA:  Complex ankle fractures.  EXAM: CT OF THE LEFT ANKLE WITHOUT CONTRAST  TECHNIQUE:  Multidetector CT imaging was performed according to the standard protocol. Multiplanar CT image reconstructions were also generated.  COMPARISON:  Radiographs 07/17/2014.  FINDINGS: Interval reduction of the fracture dislocation. No fracture of the tibia is identified. There is a comminuted fracture of the distal fibular shaft at and above the level of the ankle mortise. Medial ankle mortise widening likely due to deltoid ligament disruption. The distal fibula of maintains a normal relationship with the talus. Moderate cortical thickening and syndesmotic calcification along the lateral aspect of the tibia likely due to previous syndesmotic injury.  The talus is intact. The subtalar joints are maintained. Cystic type degenerative changes are noted in the calcaneus and cuboid.  There is air in the ankle joint and in the subcutaneous tissues most consistent with an open ankle fracture.  IMPRESSION: Comminuted distal fibular shaft fracture at and above the level of the ankle mortise with a small butterfly fragment.  Reduction of the tibiotalar dislocation. No tibial or talar fracture is identified.  Air in the joint and subcutaneous tissues consistent with an open fracture.  Suspect remote syndesmotic injury.   Electronically Signed   By: Kalman Jewels M.D.   On: 07/17/2014 11:35   Dg Knee Left Port  07/17/2014   CLINICAL DATA:  Fracture  EXAM: PORTABLE LEFT KNEE - 1-2 VIEW  COMPARISON:  None.  FINDINGS: There is no evidence of fracture, dislocation, or joint effusion. Mild degenerative changes present within the medial femoral tibial and patellofemoral joint space compartments. No soft tissue abnormality.  IMPRESSION: Negative.   Electronically Signed   By: Jeannine Boga M.D.   On: 07/17/2014 04:01   Dg Tibia/fibula Left Port  07/17/2014   CLINICAL DATA:  Fracture  EXAM: PORTABLE LEFT TIBIA AND FIBULA - 2 VIEW  COMPARISON:  Prior radiograph of the left ankle.  FINDINGS: Oblique fracture of the distal  fibula is partially visualized, better seen on prior ankle radiograph. No other acute fracture seen about the tibia and fibula. Soft tissue swelling present about the ankle.  IMPRESSION: 1. Acute oblique fracture of the distal fibula, better evaluated on Prior radiograph of the left ankle. No other acute traumatic injury about the left tibia and fibula.   Electronically Signed   By: Jeannine Boga M.D.   On: 07/17/2014 03:56   Dg Ankle Left Port  07/17/2014   CLINICAL DATA:  s/p reduction  EXAM: PORTABLE LEFT ANKLE - 2 VIEW  COMPARISON:  Prior radiograph performed earlier on the same day  FINDINGS: Splinting material overlies the ankle, limiting evaluation for fine osseous detail. Previous identified acute fracture of the distal left fibula again seen,  now in gross anatomic alignment. The ankle joint is also in gross anatomic alignment status post reduction. Soft tissue swelling again seen. No new fracture.  IMPRESSION: 1. Left ankle in gross anatomic alignment status post splinting and reduction. 2. Distal fibular fracture in near anatomic alignment.   Electronically Signed   By: Jeannine Boga M.D.   On: 07/17/2014 05:26   Dg Ankle Left Port  07/17/2014   CLINICAL DATA:  Ankle injury climbing stairs.  Deformity.  EXAM: PORTABLE LEFT ANKLE - 2 VIEW  COMPARISON:  No comparison studies available.  FINDINGS: Portable two view exam of the left ankle shows fracture dislocation. The talus is dislocated laterally relative to the tibial plafond. There is an associated fracture of the distal fibula.  IMPRESSION: Fracture dislocation of the ankle.   Electronically Signed   By: Misty Stanley M.D.   On: 07/17/2014 02:28   Dg C-arm 1-60 Min  07/19/2014   CLINICAL DATA:  ORIF left fibular fracture  EXAM: DG C-ARM 61-120 MIN  FLUOROSCOPY TIME:  1 min 10 seconds  COMPARISON:  CT ankle dated 07/17/2014  FINDINGS: Intraoperative radiographs provided during ORIF.  Lateral compression plate and screw fixation of  the fibular shaft and lateral malleolus. Single syndesmotic screw.  Minimally displaced posterior fragments on the lateral view.  Ankle mortise is preserved.  Soft tissue defect/laceration overlying the medial malleolus.  IMPRESSION: Intraoperative radiographs, as above.   Electronically Signed   By: Julian Hy M.D.   On: 07/19/2014 17:58    Disposition: 01-Home or Self Care  Discharge Instructions   Call MD / Call 911    Complete by:  As directed   If you experience chest pain or shortness of breath, CALL 911 and be transported to the hospital emergency room.  If you develope a fever above 101.5 F, pus (white drainage) or increased drainage or redness at the wound, or calf pain, call your surgeon's office.     Call MD / Call 911    Complete by:  As directed   If you experience chest pain or shortness of breath, CALL 911 and be transported to the hospital emergency room.  If you develope a fever above 101.5 F, pus (white drainage) or increased drainage or redness at the wound, or calf pain, call your surgeon's office.     Constipation Prevention    Complete by:  As directed   Drink plenty of fluids.  Prune juice may be helpful.  You may use a stool softener, such as Colace (over the counter) 100 mg twice a day.  Use MiraLax (over the counter) for constipation as needed.     Constipation Prevention    Complete by:  As directed   Drink plenty of fluids.  Prune juice may be helpful.  You may use a stool softener, such as Colace (over the counter) 100 mg twice a day.  Use MiraLax (over the counter) for constipation as needed.     Diet - low sodium heart healthy    Complete by:  As directed      Diet - low sodium heart healthy    Complete by:  As directed      Diet general    Complete by:  As directed      Diet general    Complete by:  As directed      Driving restrictions    Complete by:  As directed   No driving while taking narcotic pain meds.  Driving restrictions    Complete  by:  As directed   No driving while taking narcotic pain meds.     Increase activity slowly as tolerated    Complete by:  As directed      Increase activity slowly as tolerated    Complete by:  As directed      Non weight bearing    Complete by:  As directed            Follow-up Information   Follow up with Marianna Payment, MD In 1 week. (For suture removal, For wound re-check)    Specialty:  Orthopedic Surgery   Contact information:   [address] [place] [address] [phone]        Signed: Marianna Payment 07/26/2014, 7:57 AM

## 2014-07-23 NOTE — Progress Notes (Addendum)
SW spoke with [facility]) in regards to authorization from [place] who reported that they have contacted [place] and they have not received authorization. SW called [employer] and they reported everything needed has been faxed [phone].   SW called [employer] back to check on authorization and was told they have no information on this pt and was asked to re-fax information again. SW re-faxed information and stayed on the phone with [place] representative until pt information received. Pt information received from [facility] # [id number]. SW informed pt, attending physician,CM and Zack Brooks,AD. [employer] reported that pt will not have authorization until Monday.    Adelina Mings [phone]

## 2014-07-23 NOTE — Care Management Note (Signed)
CARE MANAGEMENT NOTE 07/23/2014  Patient:  Jared Rhodes, Jared Rhodes   Account Number:  [id number]  Date Initiated:  07/17/2014  Documentation initiated by:  Ricki Miller  Subjective/Objective Assessment:   63 yr old male s/p traumatic injury to left ankle-s/p I & D of bone,skin,muscle and subcutaneous tissue of left ankle joint     Action/Plan:   PT/OT eval  Paitent is [facility], will have to return to OR in 48-72 hrs. CM will continue to monitor.  Patient will require shortterm rehab at [facility]. Social worker is aware.Plan [facility].   Anticipated DC Date:  07/23/2014   Anticipated DC Plan:  SKILLED NURSING FACILITY  In-house referral  Clinical Social Worker      DC Planning Services  CM consult      Choice offered to / List presented to:  C-1 Patient   DME arranged  NA           Status of service:  Completed, signed off Medicare Important Message given?  YES (If response is "NO", the following Medicare IM given date fields will be blank) Date Medicare IM given:  07/19/2014 Medicare IM given by:  Ricki Miller Date Additional Medicare IM given:  07/23/2014 Additional Medicare IM given by:  Ricki Miller  Discharge Disposition:  [place]  Per UR Regulation:  Reviewed for med. necessity/level of care/duration of stay

## 2014-07-23 NOTE — Progress Notes (Signed)
Clinical Social Work Department CLINICAL SOCIAL WORK PLACEMENT NOTE 07/21/2014  Patient:  Jared Rhodes, Jared Rhodes  Account Number:  [id number] Admit date:  07/17/2014  Clinical Social Worker:  Charlene Brooke, LCSW  Date/time:  07/21/2014 01:00 PM  Clinical Social Work is seeking post-discharge placement for this patient at the following level of care:   [place]   (*CSW will update this form in Epic as items are completed)   07/21/2014  Patient/family provided with [place] Department of Clinical Social Work's list of facilities offering this level of care within the geographic area requested by the patient (or if unable, by the patient's family).  07/21/2014  Patient/family informed of their freedom to choose among providers that offer the needed level of care, that participate in Medicare, Medicaid or managed care program needed by the patient, have an available bed and are willing to accept the patient.  07/20/2014  Patient/family informed of [facility]' ownership interest in [facility], as well as of the fact that they are under no obligation to receive care at this facility.  PASARR submitted to EDS on 07/21/2014 PASARR number received on 07/21/2014  FL2 transmitted to all facilities in geographic area requested by pt/family on  07/21/2014 FL2 transmitted to all facilities within larger geographic area on 07/21/2014  Patient informed that his/her managed care company has contracts with or will negotiate with  certain facilities, including the following:     Patient/family informed of bed offers received:  07/21/2014 Patient chooses bed at [facility] Physician recommends and patient chooses bed at    Patient to be transferred to [facility] on  07/26/2014 Patient to be transferred to facility by [facility] Patient and family notified of transfer on 07/26/2014 Name of family member notified:  Diana Eves  The following physician request  were entered in Epic:   Additional Comments: Pt awaiting authorization.   Adelina Mings [phone]

## 2014-07-23 NOTE — Progress Notes (Signed)
SW faxed FL2 and clinicals out to [employer] in [place]. Will ask weekend CSW to report findings to pt.   Adelina Mings [phone]

## 2014-07-23 NOTE — Consult Note (Signed)
[facility] Consult note  [facility]. YHC:[id number] DOB: Aug 19, 1951 DOA: 07/17/2014 PCP: No PCP Per Patient  Assessment/Plan: 1. Alcohol use- intoxicated on admission -per pt, drinks and smokes only on weekends, no withdrawal symptoms noted - d/c CIWA protocol  2. Open L. Ankle fracture -per ortho, s/p I&D on 7/25 -Status post I&D again as well as ORIF on 7/27  3. HTN May be due to pain/ stress- will need to have it check regularly as outpt to determine if he has essential HTN - recommend checking QOD at nursing facility - have asked social services to provide him with health connect number to help him find a PCP  Pt awaiting insurance approval of SNF- we will sign off. Please call again if needed.  Code Status: full Family Communication: none at bedside Disposition Plan: d/c to SNF per ortho   Consultants:  none  Procedures: -per ortho, s/p I&D on 7/25 PROCEDURE:  7/27 1. Open treatment of left ankle fracture with internal fixation. Bimalleolar CPT [id number]  2. Irrigation and debridement of bone, muscle, skin associated with open fracture dislocation. CPT 11012   Antibiotics:  none   HPI/Subjective: No complaints- awaiting a bed at SNF  Objective: Filed Vitals:   07/23/14 0434  BP: 153/97  Pulse: 85  Temp: 98.6 F (37 C)  Resp: 16    Intake/Output Summary (Last 24 hours) at 07/23/14 1208 Last data filed at 07/23/14 0435  Gross per 24 hour  Intake    480 ml  Output   1250 ml  Net   -770 ml   Filed Weights   07/17/14 0116  Weight: 124.739 kg (275 lb)    Exam:  General: alert & oriented x 3 In NAD Cardiovascular: RRR, nl S1 s2 Respiratory: CTAB Abdomen: soft +BS NT/ND, no masses palpable Extremities: L.leg with ankle ace wrapped/immobilized     Data Reviewed: Basic Metabolic Panel:  Recent Labs Lab 07/17/14 0120 07/18/14 0431 07/19/14 0645 07/19/14 1910 07/20/14 0452  NA 130* 139 137  --  138  K 4.0 4.4 4.1  --  4.2  CL  92* 99 98  --  100  CO2 18* 26 26  --  25  GLUCOSE 94 105* 106*  --  101*  BUN 7 6 5*  --  4*  CREATININE 0.78 0.75 0.69 0.76 0.69  CALCIUM 8.3* 8.3* 8.6  --  8.6   Liver Function Tests:  Recent Labs Lab 07/18/14 0431 07/19/14 0645 07/20/14 0452  AST 28 22 20   ALT 31 20 16   ALKPHOS 35* 34* 34*  BILITOT 0.6 0.5 0.4  PROT 6.8 6.7 7.0  ALBUMIN 3.1* 2.8* 2.7*   No results found for this basename: LIPASE, AMYLASE,  in the last 168 hours No results found for this basename: AMMONIA,  in the last 168 hours CBC:  Recent Labs Lab 07/17/14 0120 07/19/14 1910 07/20/14 0452  WBC 7.2 7.6 6.2  NEUTROABS 3.8  --   --   HGB 13.1 12.0* 11.2*  HCT 38.1* 35.9* 34.1*  MCV 85.4 89.1 87.4  PLT 188 157 169   Cardiac Enzymes: No results found for this basename: CKTOTAL, CKMB, CKMBINDEX, TROPONINI,  in the last 168 hours BNP (last 3 results) No results found for this basename: PROBNP,  in the last 8760 hours CBG: No results found for this basename: GLUCAP,  in the last 168 hours  No results found for this or any previous visit (from the past 240 hour(s)).   Studies: No  results found.  Scheduled Meds: . enoxaparin (LOVENOX) injection  40 mg Subcutaneous Q24H  . folic acid  1 mg Oral Daily  . multivitamin with minerals  1 tablet Oral Daily  . senna  1 tablet Oral BID  . thiamine  100 mg Oral Daily   Continuous Infusions: . sodium chloride Stopped (07/20/14 1700)    Principal Problem:   Type III open fracture dislocation of left ankle joint Active Problems:   HTN (hypertension)   Severe obesity (BMI >= 40)   Alcohol intoxication    Time spent: [facility] Financial risk [email or web address], password [facility] 07/23/2014, 12:08 PM  LOS: 6 days

## 2014-07-23 NOTE — Progress Notes (Signed)
CSW spoke with pt in regards to placement. Pt has been accepted [facility] but requires an authorization. Paper work sent to [employer] on 07/21/14 and we are waiting for [place].  Pt does have another SNF offer that will accept but still requires a facility.Will continue to follow. Pt made aware.   Adelina Mings  [phone]

## 2014-07-23 NOTE — Care Management Note (Signed)
CARE MANAGEMENT NOTE 07/23/2014  Patient:  Jared Rhodes, Jared Rhodes   Account Number:  [id number]  Date Initiated:  07/17/2014  Documentation initiated by:  Ricki Miller  Subjective/Objective Assessment:   63 yr old male s/p traumatic injury to left ankle-s/p I & D of bone,skin,muscle and subcutaneous tissue of left ankle joint     Action/Plan:   PT/OT eval  Paitent is [facility], will have to return to OR in 48-72 hrs. CM will continue to monitor.  Patient will require shortterm rehab at [facility]. Social worker is aware.Plan [facility].   Anticipated DC Date:  07/23/2014   Anticipated DC Plan:  SKILLED NURSING FACILITY  In-house referral  Clinical Social Worker      DC Planning Services  CM consult      Choice offered to / List presented to:  C-1 Patient   DME arranged  NA           Status of service:  In process, will continue to follow Medicare Important Message given?  YES (If response is "NO", the following Medicare IM given date fields will be blank) Date Medicare IM given:  07/19/2014 Medicare IM given by:  Ricki Miller Date Additional Medicare IM given:  07/23/2014 Additional Medicare IM given by:  Ricki Miller  Discharge Disposition:  [place]  Per UR Regulation:  Reviewed for med. necessity/level of care/duration of stay  If discussed at [place] of Stay Meetings, dates discussed:    Comments:  07/23/14 2:34pm  Ricki Miller, RN BSN Case Manager Patient is to go to [facility]. Social worker has been trying to get approval from [place] since 07/21/14. Patient and family have contacted BC/BS to no avail. CM spoke with patient's neiceJonelle Rhodes [phone] to explain what the delay has been. Social worker has spoken with leadership concerning a [place]. Multiple facilities have been contacted and have said no to LOG. Case manager will continue to follow.

## 2014-07-23 NOTE — Progress Notes (Signed)
   Subjective:  Patient reports pain as mild.  No events.  Objective:   VITALS:   Filed Vitals:   07/22/14 0452 07/22/14 1400 07/22/14 2103 07/23/14 0434  BP: 136/93 146/99 144/90 153/97  Pulse: 85 92 92 85  Temp: 98.4 F (36.9 C) 98.2 F (36.8 C) 98.7 F (37.1 C) 98.6 F (37 C)  TempSrc: Oral  Oral Oral  Resp: 18 16 16 16   Height:      Weight:      SpO2: 97% 95% 98% 98%    Splint c/d/i   Lab Results  Component Value Date   WBC 6.2 07/20/2014   HGB 11.2* 07/20/2014   HCT 34.1* 07/20/2014   MCV 87.4 07/20/2014   PLT 169 07/20/2014     Assessment/Plan:  4 Days Post-Op   - SNF pending insurance clearance  Marianna Payment 07/23/2014, 6:56 AM [phone]

## 2014-07-24 LAB — CREATININE, SERUM
Creatinine, Ser: 0.7 mg/dL (ref 0.50–1.35)
GFR calc Af Amer: >90 mL/min (ref >90)

## 2014-07-24 NOTE — Progress Notes (Signed)
Subjective: 5 Days Post-Op Procedure(s) (LRB): IRRIGATION AND DEBRIDEMENT EXTREMITY (Left) OPEN REDUCTION INTERNAL FIXATION (ORIF) ANKLE FRACTURE (Left) Patient reports pain as moderate.    Objective: Vital signs in last 24 hours: Temp:  [97.8 F (36.6 C)-98.5 F (36.9 C)] 98.3 F (36.8 C) (08/01 0453) Pulse Rate:  [83-97] 83 (08/01 0453) Resp:  [18-20] 20 (08/01 0453) BP: (138-144)/(83-92) 142/83 mmHg (08/01 0453) SpO2:  [98 %-100 %] 98 % (08/01 0453)  Intake/Output from previous day: 07/31 0701 - 08/01 0700 In: 960 [P.O.:960] Out: 2050 [Urine:2050] Intake/Output this shift: Total I/O In: -  Out: 300 [Urine:300]  No results found for this basename: HGB,  in the last 72 hours No results found for this basename: WBC, RBC, HCT, PLT,  in the last 72 hours  Recent Labs  07/24/14 0432  CREATININE 0.70   No results found for this basename: LABPT, INR,  in the last 72 hours  Neurologically intact  Assessment/Plan: 5 Days Post-Op Procedure(s) (LRB): IRRIGATION AND DEBRIDEMENT EXTREMITY (Left) OPEN REDUCTION INTERNAL FIXATION (ORIF) ANKLE FRACTURE (Left) Plan: pending SNF     Placement problem.   Pain much better if his leg is elevated.  Jared Rhodes C 07/24/2014, 9:35 AM

## 2014-07-24 NOTE — Progress Notes (Signed)
Patient ID: Jared Rhodes., male   DOB: 1951/02/15, 63 y.o.   MRN: [id number] Patient is resting comfortably this morning. Plan for discharge to skilled nursing facility.

## 2014-07-25 NOTE — Progress Notes (Signed)
Subjective: 6 Days Post-Op Procedure(s) (LRB): IRRIGATION AND DEBRIDEMENT EXTREMITY (Left) OPEN REDUCTION INTERNAL FIXATION (ORIF) ANKLE FRACTURE (Left) Patient reports pain as mild.  Less pain with elevation.   Objective: Vital signs in last 24 hours: Temp:  [98.2 F (36.8 C)-98.3 F (36.8 C)] 98.2 F (36.8 C) (08/02 0503) Pulse Rate:  [80-97] 80 (08/02 0503) Resp:  [16-20] 16 (08/02 0503) BP: (144-152)/(80-91) 144/91 mmHg (08/02 0503) SpO2:  [97 %-100 %] 100 % (08/02 0503)  Intake/Output from previous day: 08/01 0701 - 08/02 0700 In: 1220 [P.O.:1220] Out: 2250 [Urine:2250] Intake/Output this shift: Total I/O In: -  Out: 400 [Urine:400]  No results found for this basename: HGB,  in the last 72 hours No results found for this basename: WBC, RBC, HCT, PLT,  in the last 72 hours  Recent Labs  07/24/14 0432  CREATININE 0.70   No results found for this basename: LABPT, INR,  in the last 72 hours  splint intact  Assessment/Plan: 6 Days Post-Op Procedure(s) (LRB): IRRIGATION AND DEBRIDEMENT EXTREMITY (Left) OPEN REDUCTION INTERNAL FIXATION (ORIF) ANKLE FRACTURE (Left) Plan:  Placement vs home .   Genova Kiner C 07/25/2014, 9:19 AM

## 2014-07-25 NOTE — Progress Notes (Signed)
Patient ID: Jared Rhodes., male   DOB: [birth year], 63 y.o.   MRN: [id number] Patient resting comfortably this morning. No complaints. Plan for skilled nursing placement.

## 2014-07-26 LAB — CREATININE, SERUM
CREATININE: 0.7 mg/dL (ref 0.50–1.35)
GFR calc non Af Amer: >90 mL/min (ref >90)

## 2014-07-26 NOTE — Discharge Summary (Signed)
Physician Discharge Summary      Patient ID: Jared Rhodes. MRN: [id number] DOB/AGE: [birth year] 63 y.o.  Admit date: 07/17/2014 Discharge date: 07/26/2014  Admission Diagnoses:  Type III open fracture dislocation of left ankle joint  Discharge Diagnoses:  Principal Problem:   Type III open fracture dislocation of left ankle joint Active Problems:   HTN (hypertension)   Severe obesity (BMI >= 40)   Alcohol intoxication   Past Medical History  Diagnosis Date  . Hypertension     Surgeries: Procedure(s): IRRIGATION AND DEBRIDEMENT EXTREMITY OPEN REDUCTION INTERNAL FIXATION (ORIF) ANKLE FRACTURE on 07/17/2014 - 07/19/2014   Consultants (if any):    Discharged Condition: Improved  Hospital Course: Jared Rhodes. is an 63 y.o. male who was admitted 07/17/2014 with a diagnosis of Type III open fracture dislocation of left ankle joint and went to the operating room on 07/17/2014 - 07/19/2014 and underwent the above named procedures.    He was given perioperative antibiotics:      Anti-infectives   Start     Dose/Rate Route Frequency Ordered Stop   07/20/14 0600  ceFAZolin (ANCEF) 3 g in dextrose 5 % 50 mL IVPB  Status:  Discontinued     3 g 160 mL/hr over 30 Minutes Intravenous On call to O.R. 07/19/14 1802 07/19/14 1843   07/19/14 2100  ceFAZolin (ANCEF) IVPB 2 g/50 mL premix     2 g 100 mL/hr over 30 Minutes Intravenous Every 6 hours 07/19/14 1802 07/20/14 0837   07/17/14 1200  ceFAZolin (ANCEF) IVPB 2 g/50 mL premix  Status:  Discontinued     2 g 100 mL/hr over 30 Minutes Intravenous Every 6 hours 07/17/14 0809 07/19/14 1856   07/17/14 0145  ceFAZolin (ANCEF) 3 g in dextrose 5 % 50 mL IVPB     3 g 160 mL/hr over 30 Minutes Intravenous  Once 07/17/14 0135 07/17/14 0426    .  He was given sequential compression devices, early ambulation, and lovenox for DVT prophylaxis.  He benefited maximally from the hospital stay and there were no complications.     Recent vital signs:  Filed Vitals:   07/26/14 0639  BP: 147/94  Pulse: 97  Temp: 99.4 F (37.4 C)  Resp: 18    Recent laboratory studies:  Lab Results  Component Value Date   HGB 11.2* 07/20/2014   HGB 12.0* 07/19/2014   HGB 13.1 07/17/2014   Lab Results  Component Value Date   WBC 6.2 07/20/2014   PLT 169 07/20/2014   Lab Results  Component Value Date   INR 1.06 01/30/2014   Lab Results  Component Value Date   NA 138 07/20/2014   K 4.2 07/20/2014   CL 100 07/20/2014   CO2 25 07/20/2014   BUN 4* 07/20/2014   CREATININE 0.70 07/26/2014   GLUCOSE 101* 07/20/2014    Discharge Medications:     Medication List         enoxaparin 40 MG/0.4ML injection  Commonly known as:  LOVENOX  Inject 0.4 mLs (40 mg total) into the skin daily.     multivitamin-iron-minerals-folic acid chewable tablet  Chew 1 tablet by mouth daily.     oxyCODONE 5 MG immediate release tablet  Commonly known as:  Oxy IR/ROXICODONE  Take 1-3 tablets (5-15 mg total) by mouth every 4 (four) hours as needed.     senna-docusate 8.6-50 MG per tablet  Commonly known as:  SENOKOT S  Take 1 tablet by mouth at bedtime as  needed.        Diagnostic Studies: Dg Ankle Complete Left  August 15, 2014   CLINICAL DATA:  ORIF left ankle.  EXAM: LEFT ANKLE COMPLETE - 3+ VIEW  COMPARISON:  CT 07/17/2014.  FINDINGS: Patient status post open reduction internal fixation of distal fibular fracture with good anatomic alignment. Medial malleolus and posterior malleolus intact.  IMPRESSION: Patient status post open reduction internal fixation distal fibular fracture with good anatomic alignment.   Electronically Signed   By: Marcello Moores  Register   On: 08-15-2014 17:11   Ct Ankle Left Wo Contrast  07/17/2014   CLINICAL DATA:  Complex ankle fractures.  EXAM: CT OF THE LEFT ANKLE WITHOUT CONTRAST  TECHNIQUE: Multidetector CT imaging was performed according to the standard protocol. Multiplanar CT image reconstructions were also generated.   COMPARISON:  Radiographs 07/17/2014.  FINDINGS: Interval reduction of the fracture dislocation. No fracture of the tibia is identified. There is a comminuted fracture of the distal fibular shaft at and above the level of the ankle mortise. Medial ankle mortise widening likely due to deltoid ligament disruption. The distal fibula of maintains a normal relationship with the talus. Moderate cortical thickening and syndesmotic calcification along the lateral aspect of the tibia likely due to previous syndesmotic injury.  The talus is intact. The subtalar joints are maintained. Cystic type degenerative changes are noted in the calcaneus and cuboid.  There is air in the ankle joint and in the subcutaneous tissues most consistent with an open ankle fracture.  IMPRESSION: Comminuted distal fibular shaft fracture at and above the level of the ankle mortise with a small butterfly fragment.  Reduction of the tibiotalar dislocation. No tibial or talar fracture is identified.  Air in the joint and subcutaneous tissues consistent with an open fracture.  Suspect remote syndesmotic injury.   Electronically Signed   By: Kalman Jewels M.D.   On: 07/17/2014 11:35   Dg Knee Left Port  07/17/2014   CLINICAL DATA:  Fracture  EXAM: PORTABLE LEFT KNEE - 1-2 VIEW  COMPARISON:  None.  FINDINGS: There is no evidence of fracture, dislocation, or joint effusion. Mild degenerative changes present within the medial femoral tibial and patellofemoral joint space compartments. No soft tissue abnormality.  IMPRESSION: Negative.   Electronically Signed   By: Jeannine Boga M.D.   On: 07/17/2014 04:01   Dg Tibia/fibula Left Port  07/17/2014   CLINICAL DATA:  Fracture  EXAM: PORTABLE LEFT TIBIA AND FIBULA - 2 VIEW  COMPARISON:  Prior radiograph of the left ankle.  FINDINGS: Oblique fracture of the distal fibula is partially visualized, better seen on prior ankle radiograph. No other acute fracture seen about the tibia and fibula. Soft  tissue swelling present about the ankle.  IMPRESSION: 1. Acute oblique fracture of the distal fibula, better evaluated on Prior radiograph of the left ankle. No other acute traumatic injury about the left tibia and fibula.   Electronically Signed   By: Jeannine Boga M.D.   On: 07/17/2014 03:56   Dg Ankle Left Port  07/17/2014   CLINICAL DATA:  s/p reduction  EXAM: PORTABLE LEFT ANKLE - 2 VIEW  COMPARISON:  Prior radiograph performed earlier on the same day  FINDINGS: Splinting material overlies the ankle, limiting evaluation for fine osseous detail. Previous identified acute fracture of the distal left fibula again seen, now in gross anatomic alignment. The ankle joint is also in gross anatomic alignment status post reduction. Soft tissue swelling again seen. No new fracture.  IMPRESSION: 1.  Left ankle in gross anatomic alignment status post splinting and reduction. 2. Distal fibular fracture in near anatomic alignment.   Electronically Signed   By: Jeannine Boga M.D.   On: 07/17/2014 05:26   Dg Ankle Left Port  07/17/2014   CLINICAL DATA:  Ankle injury climbing stairs.  Deformity.  EXAM: PORTABLE LEFT ANKLE - 2 VIEW  COMPARISON:  No comparison studies available.  FINDINGS: Portable two view exam of the left ankle shows fracture dislocation. The talus is dislocated laterally relative to the tibial plafond. There is an associated fracture of the distal fibula.  IMPRESSION: Fracture dislocation of the ankle.   Electronically Signed   By: Misty Stanley M.D.   On: 07/17/2014 02:28   Dg C-arm 1-60 Min  07/19/2014   CLINICAL DATA:  ORIF left fibular fracture  EXAM: DG C-ARM 61-120 MIN  FLUOROSCOPY TIME:  1 min 10 seconds  COMPARISON:  CT ankle dated 07/17/2014  FINDINGS: Intraoperative radiographs provided during ORIF.  Lateral compression plate and screw fixation of the fibular shaft and lateral malleolus. Single syndesmotic screw.  Minimally displaced posterior fragments on the lateral view.   Ankle mortise is preserved.  Soft tissue defect/laceration overlying the medial malleolus.  IMPRESSION: Intraoperative radiographs, as above.   Electronically Signed   By: Julian Hy M.D.   On: 07/19/2014 17:58    Disposition: 01-Home or Self Care  Discharge Instructions   Call MD / Call 911    Complete by:  As directed   If you experience chest pain or shortness of breath, CALL 911 and be transported to the hospital emergency room.  If you develope a fever above 101.5 F, pus (white drainage) or increased drainage or redness at the wound, or calf pain, call your surgeon's office.     Call MD / Call 911    Complete by:  As directed   If you experience chest pain or shortness of breath, CALL 911 and be transported to the hospital emergency room.  If you develope a fever above 101.5 F, pus (white drainage) or increased drainage or redness at the wound, or calf pain, call your surgeon's office.     Call MD / Call 911    Complete by:  As directed   If you experience chest pain or shortness of breath, CALL 911 and be transported to the hospital emergency room.  If you develope a fever above 101.5 F, pus (white drainage) or increased drainage or redness at the wound, or calf pain, call your surgeon's office.     Constipation Prevention    Complete by:  As directed   Drink plenty of fluids.  Prune juice may be helpful.  You may use a stool softener, such as Colace (over the counter) 100 mg twice a day.  Use MiraLax (over the counter) for constipation as needed.     Constipation Prevention    Complete by:  As directed   Drink plenty of fluids.  Prune juice may be helpful.  You may use a stool softener, such as Colace (over the counter) 100 mg twice a day.  Use MiraLax (over the counter) for constipation as needed.     Constipation Prevention    Complete by:  As directed   Drink plenty of fluids.  Prune juice may be helpful.  You may use a stool softener, such as Colace (over the counter) 100 mg  twice a day.  Use MiraLax (over the counter) for constipation as needed.  Diet - low sodium heart healthy    Complete by:  As directed      Diet - low sodium heart healthy    Complete by:  As directed      Diet - low sodium heart healthy    Complete by:  As directed      Diet general    Complete by:  As directed      Diet general    Complete by:  As directed      Diet general    Complete by:  As directed      Driving restrictions    Complete by:  As directed   No driving while taking narcotic pain meds.     Driving restrictions    Complete by:  As directed   No driving while taking narcotic pain meds.     Driving restrictions    Complete by:  As directed   No driving while taking narcotic pain meds.     Increase activity slowly as tolerated    Complete by:  As directed      Increase activity slowly as tolerated    Complete by:  As directed      Increase activity slowly as tolerated    Complete by:  As directed      Non weight bearing    Complete by:  As directed            Follow-up Information   Follow up with Marianna Payment, MD In 1 week. (For suture removal, For wound re-check)    Specialty:  Orthopedic Surgery   Contact information:   [address] [place] [address] [phone]        Signed: Marianna Payment 07/26/2014, 9:54 AM

## 2014-07-26 NOTE — Progress Notes (Signed)
CSW received call from [facility] reporting that authorization was given by [employer]. Pt informed. Pt to discharge today.   Charlene Brooke, MSW [phone]

## 2014-07-26 NOTE — Progress Notes (Signed)
Pt upset about delays with insurance authorization. It was explained to pt that once authorization has been received he will be discharged to facility of choice, [facility] Authorization received today. Pt informed of authorization and of discharge. [facility] was arranged for 11:00 am transport. Pt upset about discharge time SW pushed time back until 12:00 pm.   Jared Rhodes [phone]

## 2014-07-26 NOTE — Progress Notes (Signed)
   Subjective:  Patient reports pain as mild.  No events.  Objective:   VITALS:   Filed Vitals:   07/25/14 0503 07/25/14 1353 07/25/14 2104 07/26/14 0639  BP: 144/91 132/98 167/89 147/94  Pulse: 80 89 104 97  Temp: 98.2 F (36.8 C) 98.4 F (36.9 C) 97.8 F (36.6 C) 99.4 F (37.4 C)  TempSrc: Oral Oral Oral Oral  Resp: 16 18 18 18   Height:      Weight:      SpO2: 100% 98% 100% 100%    Incision c/d/i Medial traumatic wound is c/d/i Foot wwp   Lab Results  Component Value Date   WBC 6.2 07/20/2014   HGB 11.2* 07/20/2014   HCT 34.1* 07/20/2014   MCV 87.4 07/20/2014   PLT 169 07/20/2014     Assessment/Plan:  7 Days Post-Op   - SNF pending insurance clearance, hopefully today - splint changed, incisions c/d/i - back into splint today - NWB LLE  Marianna Payment 07/26/2014, 7:54 AM [phone]

## 2015-05-09 ENCOUNTER — Emergency Department (HOSPITAL_COMMUNITY)
Admission: EM | Admit: 2015-05-09 | Discharge: 2015-05-09 | Disposition: A | Discharge status: Home / Self Care | Payer: Self-pay | Attending: Emergency Medicine | Admitting: Emergency Medicine

## 2015-05-09 ENCOUNTER — Encounter (HOSPITAL_COMMUNITY): Payer: Self-pay | Admitting: Emergency Medicine

## 2015-05-09 ENCOUNTER — Emergency Department (HOSPITAL_COMMUNITY): Payer: Self-pay

## 2015-05-09 DIAGNOSIS — I10 Essential (primary) hypertension: Secondary | ICD-10-CM | POA: Insufficient documentation

## 2015-05-09 DIAGNOSIS — Z79899 Other long term (current) drug therapy: Secondary | ICD-10-CM | POA: Insufficient documentation

## 2015-05-09 DIAGNOSIS — M1611 Unilateral primary osteoarthritis, right hip: Secondary | ICD-10-CM | POA: Insufficient documentation

## 2015-05-09 DIAGNOSIS — H539 Unspecified visual disturbance: Secondary | ICD-10-CM

## 2015-05-09 DIAGNOSIS — H538 Other visual disturbances: Secondary | ICD-10-CM | POA: Insufficient documentation

## 2015-05-09 DIAGNOSIS — M545 Low back pain: Secondary | ICD-10-CM | POA: Insufficient documentation

## 2015-05-09 DIAGNOSIS — Z720 Tobacco use: Secondary | ICD-10-CM | POA: Insufficient documentation

## 2015-05-09 DIAGNOSIS — M25551 Pain in right hip: Secondary | ICD-10-CM

## 2015-05-09 DIAGNOSIS — Z880 Allergy status to penicillin: Secondary | ICD-10-CM | POA: Insufficient documentation

## 2015-05-09 DIAGNOSIS — R109 Unspecified abdominal pain: Secondary | ICD-10-CM | POA: Insufficient documentation

## 2015-05-09 LAB — CBG MONITORING, ED: GLUCOSE-CAPILLARY: 106 mg/dL — AB (ref 65–99)

## 2015-05-09 MED ORDER — IBUPROFEN 600 MG PO TABS: 600.0000 mg | ORAL_TABLET | Freq: Three times a day (TID) | ORAL | Status: DC | PRN

## 2015-05-09 MED ORDER — IBUPROFEN 400 MG PO TABS
600.0000 mg | ORAL_TABLET | Freq: Once | ORAL | Status: AC
  Administered 2015-05-09: 600 mg via ORAL
  Filled 2015-05-09 (×2): qty 1

## 2015-05-09 NOTE — Discharge Instructions (Signed)
Your pain is due to hip arthritis. You'll need to see the orthopedist to have this further evaluated. Use tylenol and motrin as directed as needed for pain. Use heat as needed for further pain control. Additionally, you need to follow up with a primary care doctor, using the list below, for your blood pressure management. You also need to see your eye doctor for updated glasses. Return to the ER for changes or worsening symptoms.   Osteoarthritis Osteoarthritis is a disease that causes soreness and inflammation of a joint. It occurs when the cartilage at the affected joint wears down. Cartilage acts as a cushion, covering the ends of bones where they meet to form a joint. Osteoarthritis is the most common form of arthritis. It often occurs in older people. The joints affected most often by this condition include those in the:  Ends of the fingers.  Thumbs.  Neck.  Lower back.  Knees.  Hips. CAUSES  Over time, the cartilage that covers the ends of bones begins to wear away. This causes bone to rub on bone, producing pain and stiffness in the affected joints.  RISK FACTORS Certain factors can increase your chances of having osteoarthritis, including:  Older age.  Excessive body weight.  Overuse of joints.  Previous joint injury. SIGNS AND SYMPTOMS   Pain, swelling, and stiffness in the joint.  Over time, the joint may lose its normal shape.  Small deposits of bone (osteophytes) may grow on the edges of the joint.  Bits of bone or cartilage can break off and float inside the joint space. This may cause more pain and damage. DIAGNOSIS  Your health care provider will do a physical exam and ask about your symptoms. Various tests may be ordered, such as:  X-rays of the affected joint.  An MRI scan.  Blood tests to rule out other types of arthritis.  Joint fluid tests. This involves using a needle to draw fluid from the joint and examining the fluid under a  microscope. TREATMENT  Goals of treatment are to control pain and improve joint function. Treatment plans may include:  A prescribed exercise program that allows for rest and joint relief.  A weight control plan.  Pain relief techniques, such as:  Properly applied heat and cold.  Electric pulses delivered to nerve endings under the skin (transcutaneous electrical nerve stimulation [TENS]).  Massage.  Certain nutritional supplements.  Medicines to control pain, such as:  Acetaminophen.  Nonsteroidal anti-inflammatory drugs (NSAIDs), such as naproxen.  Narcotic or central-acting agents, such as tramadol.  Corticosteroids. These can be given orally or as an injection.  Surgery to reposition the bones and relieve pain (osteotomy) or to remove loose pieces of bone and cartilage. Joint replacement may be needed in advanced states of osteoarthritis. HOME CARE INSTRUCTIONS   Take medicines only as directed by your health care provider.  Maintain a healthy weight. Follow your health care provider's instructions for weight control. This may include dietary instructions.  Exercise as directed. Your health care provider can recommend specific types of exercise. These may include:  Strengthening exercises. These are done to strengthen the muscles that support joints affected by arthritis. They can be performed with weights or with exercise bands to add resistance.  Aerobic activities. These are exercises, such as brisk walking or low-impact aerobics, that get your heart pumping.  Range-of-motion activities. These keep your joints limber.  Balance and agility exercises. These help you maintain daily living skills.  Rest your affected joints as  directed by your health care provider.  Keep all follow-up visits as directed by your health care provider. SEEK MEDICAL CARE IF:   Your skin turns red.  You develop a rash in addition to your joint pain.  You have worsening joint  pain.  You have a fever along with joint or muscle aches. SEEK IMMEDIATE MEDICAL CARE IF:  You have a significant loss of weight or appetite.  You have night sweats. [place] of Arthritis and Musculoskeletal and Skin Diseases: www.niams.SouthExposed.es  [employer] on Aging: http://kim-miller.com/  American College of Rheumatology: www.rheumatology.org Document Released: 12/10/2005 Document Revised: 04/26/2014 Document Reviewed: 08/17/2013 [facility] Patient Information 2015 [place], [place]. This information is not intended to replace advice given to you by your health care provider. Make sure you discuss any questions you have with your health care provider.  Hypertension Hypertension, commonly called high blood pressure, is when the force of blood pumping through your arteries is too strong. Your arteries are the blood vessels that carry blood from your heart throughout your body. A blood pressure reading consists of a higher number over a lower number, such as 110/72. The higher number (systolic) is the pressure inside your arteries when your heart pumps. The lower number (diastolic) is the pressure inside your arteries when your heart relaxes. Ideally you want your blood pressure below 120/80. Hypertension forces your heart to work harder to pump blood. Your arteries may become narrow or stiff. Having hypertension puts you at risk for heart disease, stroke, and other problems.  RISK FACTORS Some risk factors for high blood pressure are controllable. Others are not.  Risk factors you cannot control include:   Race. You may be at higher risk if you are African American.  Age. Risk increases with age.  Gender. Men are at higher risk than women before age 12 years. After age 63, women are at higher risk than men. Risk factors you can control include:  Not getting enough exercise or physical activity.  Being overweight.  Getting too much fat, sugar, calories,  or salt in your diet.  Drinking too much alcohol. SIGNS AND SYMPTOMS Hypertension does not usually cause signs or symptoms. Extremely high blood pressure (hypertensive crisis) may cause headache, anxiety, shortness of breath, and nosebleed. DIAGNOSIS  To check if you have hypertension, your health care provider will measure your blood pressure while you are seated, with your arm held at the level of your heart. It should be measured at least twice using the same arm. Certain conditions can cause a difference in blood pressure between your right and left arms. A blood pressure reading that is higher than normal on one occasion does not mean that you need treatment. If one blood pressure reading is high, ask your health care provider about having it checked again. TREATMENT  Treating high blood pressure includes making lifestyle changes and possibly taking medicine. Living a healthy lifestyle can help lower high blood pressure. You may need to change some of your habits. Lifestyle changes may include:  Following the DASH diet. This diet is high in fruits, vegetables, and whole grains. It is low in salt, red meat, and added sugars.  Getting at least 2 hours of brisk physical activity every week.  Losing weight if necessary.  Not smoking.  Limiting alcoholic beverages.  Learning ways to reduce stress. If lifestyle changes are not enough to get your blood pressure under control, your health care provider may prescribe medicine. You may need  to take more than one. Work closely with your health care provider to understand the risks and benefits. HOME CARE INSTRUCTIONS  Have your blood pressure rechecked as directed by your health care provider.   Take medicines only as directed by your health care provider. Follow the directions carefully. Blood pressure medicines must be taken as prescribed. The medicine does not work as well when you skip doses. Skipping doses also puts you at risk for  problems.   Do not smoke.   Monitor your blood pressure at home as directed by your health care provider. SEEK MEDICAL CARE IF:   You think you are having a reaction to medicines taken.  You have recurrent headaches or feel dizzy.  You have swelling in your ankles.  You have trouble with your vision. SEEK IMMEDIATE MEDICAL CARE IF:  You develop a severe headache or confusion.  You have unusual weakness, numbness, or feel faint.  You have severe chest or abdominal pain.  You vomit repeatedly.  You have trouble breathing. MAKE SURE YOU:   Understand these instructions.  Will watch your condition.  Will get help right away if you are not doing well or get worse. Document Released: 12/10/2005 Document Revised: 04/26/2014 Document Reviewed: 10/02/2013 [facility] Patient Information 2015 [place], [place]. This information is not intended to replace advice given to you by your health care provider. Make sure you discuss any questions you have with your health care provider.  Heat Therapy Heat therapy can help make painful, stiff muscles and joints feel better. Do not use heat on new injuries. Wait at least 48 hours after an injury to use heat. Do not use heat when you have aches or pains right after an activity. If you still have pain 3 hours after stopping the activity, then you may use heat. HOME CARE Wet heat pack  Soak a clean towel in warm water. Squeeze out the extra water.  Put the warm, wet towel in a plastic bag.  Place a thin, dry towel between your skin and the bag.  Put the heat pack on the area for 5 minutes, and check your skin. Your skin may be pink, but it should not be red.  Leave the heat pack on the area for 15 to 30 minutes.  Repeat this every 2 to 4 hours while awake. Do not use heat while you are sleeping. Warm water bath  Fill a tub with warm water.  Place the affected body part in the tub.  Soak the area for 20 to 40 minutes.  Repeat as  needed. Hot water bottle  Fill the water bottle half full with hot water.  Press out the extra air. Close the cap tightly.  Place a dry towel between your skin and the bottle.  Put the bottle on the area for 5 minutes, and check your skin. Your skin may be pink, but it should not be red.  Leave the bottle on the area for 15 to 30 minutes.  Repeat this every 2 to 4 hours while awake. Electric heating pad  Place a dry towel between your skin and the heating pad.  Set the heating pad on low heat.  Put the heating pad on the area for 10 minutes, and check your skin. Your skin may be pink, but it should not be red.  Leave the heating pad on the area for 20 to 40 minutes.  Repeat this every 2 to 4 hours while awake.  Do not lie on the  heating pad.  Do not fall asleep while using the heating pad.  Do not use the heating pad near water. GET HELP RIGHT AWAY IF:  You get blisters or red skin.  Your skin is puffy (swollen), or you lose feeling (numbness) in the affected area.  You have any new problems.  Your problems are getting worse.  You have any questions or concerns. If you have any problems, stop using heat therapy until you see your doctor. MAKE SURE YOU:  Understand these instructions.  Will watch your condition.  Will get help right away if you are not doing well or get worse. Document Released: 03/03/2012 Document Reviewed: 02/02/2014 [facility] Patient Information 2015 [place]. This information is not intended to replace advice given to you by your health care provider. Make sure you discuss any questions you have with your health care provider.   Emergency Department Resource Guide 1) Find a Doctor and Pay Out of Pocket Although you won't have to find out who is covered by your insurance plan, it is a good idea to ask around and get recommendations. You will then need to call the office and see if the doctor you have chosen will accept you as a new  patient and what types of options they offer for patients who are self-pay. Some doctors offer discounts or will set up payment plans for their patients who do not have insurance, but you will need to ask so you aren't surprised when you get to your appointment.  2) Contact Your Local Health Department Not all health departments have doctors that can see patients for sick visits, but many do, so it is worth a call to see if yours does. If you don't know where your local health department is, you can check in your phone book. The CDC also has a tool to help you locate your state's health department, and many state websites also have listings of all of their local health departments.  3) Find a [facility] If your illness is not likely to be very severe or complicated, you may want to try a walk in clinic. These are popping up all over the country in pharmacies, drugstores, and shopping centers. They're usually staffed by nurse practitioners or physician assistants that have been trained to treat common illnesses and complaints. They're usually fairly quick and inexpensive. However, if you have serious medical issues or chronic medical problems, these are probably not your best option.  No Primary Care Doctor: - Call Health Connect at  [phone] - they can help you locate a primary care doctor that  accepts your insurance, provides certain services, etc. - Physician Referral Service- [phone]  Chronic Pain Problems: Organization         Address  Phone   Notes  [facility]  [phone] Patients need to be referred by their primary care doctor.   Medication Assistance: Organization         Address  Phone   Notes  [facility] Medication [facility] [address] [place], [place] [address] [phone] --Must be a resident of [facility] -- Must have NO insurance coverage whatsoever (no Medicaid/ Medicare, etc.) -- The pt. MUST have a primary  care doctor that directs their care regularly and follows them in the community   [facility]  [phone]   [employer]  [phone]    Agencies that provide inexpensive medical care: Organization  Address  Phone   Notes  [place]  [phone]   [facility]    [phone]   [facility] [place], [place] [address] [phone]   [place] [address] [place], [place] [phone]   [facility]    [phone]   [facility]    [phone]   [place] [address] [place], [place] Phone:  [phone], Fax:  [phone] Hours of Operation:  9 am - 6 pm, M-F.  Also accepts Medicaid/Medicare and self-pay.  [facility] for [place] [address], [address], [place] Phone: [phone], Fax: [phone]. Hours of Operation:  8:30 am - 5:30 pm, M-F.  Also accepts Medicaid and self-pay.  [facility] [place] [address] [place] Phone: [phone]   [place], [place], [place] [phone], Ext. [phone] Mondays & Thursdays: 7-9 AM.  First 15 patients are seen on a first come, first serve basis.    [place] Providers:  Organization         Address  Phone   Notes  [facility] [address], [place] [phone] Also accepts self-pay patients.  [facility] [address], [place]  [phone]   [place], [address], [place] [phone]   [facility] [address] [place], [place] [phone]   Lucianne Lei [address], [place]   [phone] Only accepts [place] [facility] [place] patients after they have their name applied to their card.   Self-Pay (no insurance) in [facility]:  Organization         Address  Phone   Notes  Sickle Cell Patients, [facility] [place] [phone]   [facility] [place] [phone]   [facility] [place]  [place], [place], [place] [phone]   [facility]/Dr. Osei-Bonsu  [address], [place] or [address], [place] [phone] Phone number for both [place] and [place] locations is the same.  [facility] [address], [place] [phone]   [facility] [address] [place] or [address] [phone]   [facility] [address] [place] [phone], phone; [phone], fax Sees patients 1st and 3rd Saturday of every month.  Must not qualify for public or private insurance (i.e. Medicaid, Medicare, [place] Choice, Veterans' Benefits)  Household income should be no more than 200% of the poverty level The clinic cannot treat you if you are pregnant or think you are pregnant  Sexually transmitted diseases are not treated at the clinic.

## 2015-05-09 NOTE — ED Provider Notes (Signed)
CSN: [id number]     Arrival date & time 05/09/15  9983 History   First MD Initiated Contact with Patient 05/09/15 [phone]     Chief Complaint  Patient presents with  . Blurred Vision  . Flank Pain    right down right leg     (Consider location/radiation/quality/duration/timing/severity/associated sxs/prior Treatment) HPI Comments: Jared Rhodes. is a 64 y.o. male with a PMHx of HTN, who presents to the ED with complaints of 6 months of right hip and low back pain. He states that the pain is 6/10 throbbing and aching located primarily in his right hip radiating down his right leg and across his lower back, constant but worse in the morning and gradually improves throughout the day before becoming worse at night, worse with walking, and improved with Advil. He is a driver of a cab and admits that he previously had back pain in the past due to using his wallet in his back pocket. He has since been removed his wallet from his back pocket but continues to have pain. He denies any recent trauma or heavy lifting/twisting. Denies any fevers, chills, chest pain, shortness of breath, abdominal pain, nausea, vomiting, diarrhea, constipation, dysuria, hematuria, flank pain, incontinence of urine or stool, cauda equina symptoms, numbness, tingling, weakness, or headache. Denies any history of cancer or IV drug use. Additionally he states he has intermittent blurred vision which is currently resolved, and admits that he used to work glasses but he doesn't have them anymore and believes that he needs to go back to the ophthalmologist.  Patient is a 64 y.o. male presenting with hip pain. The history is provided by the patient. No language interpreter was used.  Hip Pain This is a chronic problem. The current episode started more than 1 month ago. The problem occurs constantly. The problem has been unchanged. Associated symptoms include arthralgias (R hip) and a visual change (intermittently blurred). Pertinent  negatives include no abdominal pain, chest pain, chills, fever, headaches, joint swelling, myalgias, nausea, numbness, vomiting or weakness. The symptoms are aggravated by walking. He has tried NSAIDs for the symptoms. The treatment provided moderate relief.    Past Medical History  Diagnosis Date  . Hypertension    Past Surgical History  Procedure Laterality Date  . Tonsillectomy    . I&d extremity Left 07/17/2014    Procedure: IRRIGATION AND DEBRIDEMENT EXTREMITY;  Surgeon: Marianna Payment, MD;  Location: [place];  Service: Orthopedics;  Laterality: Left;  . I&d extremity Left 07/19/2014    Procedure: IRRIGATION AND DEBRIDEMENT EXTREMITY;  Surgeon: Marianna Payment, MD;  Location: [place];  Service: Orthopedics;  Laterality: Left;  . Orif ankle fracture Left 07/19/2014    Procedure: OPEN REDUCTION INTERNAL FIXATION (ORIF) ANKLE FRACTURE;  Surgeon: Marianna Payment, MD;  Location: [place];  Service: Orthopedics;  Laterality: Left;   Family History  Problem Relation Age of Onset  . Alcohol abuse Mother   . Hyperlipidemia Brother   . Heart disease Sister    History  Substance Use Topics  . Smoking status: Current Some Day Smoker  . Smokeless tobacco: Never Used  . Alcohol Use: Yes     Comment: every now and then    Review of Systems  Constitutional: Negative for fever and chills.  Eyes: Positive for visual disturbance (intermittently blurred, states he needs glasses). Negative for pain.  Respiratory: Negative for shortness of breath.   Cardiovascular: Negative for chest pain.  Gastrointestinal: Negative for nausea, vomiting, abdominal pain,  diarrhea and constipation.  Genitourinary: Negative for dysuria, hematuria and flank pain.  Musculoskeletal: Positive for back pain (low back) and arthralgias (R hip). Negative for myalgias and joint swelling.  Skin: Negative for color change.  Neurological: Negative for weakness, light-headedness, numbness and headaches.    Psychiatric/Behavioral: Negative for confusion.   10 Systems reviewed and are negative for acute change except as noted in the HPI.    Allergies  Penicillins  Home Medications   Prior to Admission medications   Medication Sig Start Date End Date Taking? Authorizing Provider  enoxaparin (LOVENOX) 40 MG/0.4ML injection Inject 0.4 mLs (40 mg total) into the skin daily. 07/19/14   Leandrew Koyanagi, MD  multivitamin-iron-minerals-folic acid (CENTRUM) chewable tablet Chew 1 tablet by mouth daily.    Historical Provider, MD  oxyCODONE (OXY IR/ROXICODONE) 5 MG immediate release tablet Take 1-3 tablets (5-15 mg total) by mouth every 4 (four) hours as needed. 07/19/14   Naiping Ephriam Jenkins, MD  senna-docusate (SENOKOT S) 8.6-50 MG per tablet Take 1 tablet by mouth at bedtime as needed. 07/19/14   Naiping Ephriam Jenkins, MD   BP 179/102 mmHg  Pulse 82  Temp(Src) 98.2 F (36.8 C) (Oral)  Resp 18  Ht 5\' 9"  (1.753 m)  Wt 236 lb 1 oz (107.077 kg)  BMI 34.84 kg/m2  SpO2 99% Physical Exam  Constitutional: He is oriented to person, place, and time. He appears well-developed and well-nourished.  Non-toxic appearance. No distress.  Afebrile, nontoxic, NAD HTN noted on triage vitals, improved down to 150/79 during exam  HENT:  Head: Normocephalic and atraumatic.  Mouth/Throat: Oropharynx is clear and moist and mucous membranes are normal.  Eyes: Conjunctivae and EOM are normal. Pupils are equal, round, and reactive to light. Right eye exhibits no discharge. Left eye exhibits no discharge.  PERRL, EOMI, no nystagmus, no visual field deficits  Visual Acuity - Bilateral Near: 20/50 ; Bilateral Distance: 20/200 ; R Near: 20/70 ; R Distance: 20/100 ; L Near: 20/50 ; L Distance: 20/100  Neck: Normal range of motion. Neck supple. No spinous process tenderness and no muscular tenderness present. No rigidity. Normal range of motion present.  FROM intact without spinous process or paraspinous muscle TTP, no bony stepoffs or  deformities, no muscle spasms. No rigidity or meningeal signs.   Cardiovascular: Normal rate, regular rhythm, normal heart sounds and intact distal pulses.  Exam reveals no gallop and no friction rub.   No murmur heard. Pulmonary/Chest: Effort normal and breath sounds normal. No respiratory distress. He has no decreased breath sounds. He has no wheezes. He has no rhonchi. He has no rales.  Abdominal: Soft. Normal appearance and bowel sounds are normal. He exhibits no distension. There is no tenderness. There is no rigidity, no rebound, no guarding, no CVA tenderness, no tenderness at McBurney's point and negative Murphy's sign.  Soft, NTND, +BS throughout, no r/g/r, neg murphy's, neg mcburney's, no CVA TTP   Musculoskeletal:       Right hip: He exhibits decreased range of motion (mildly, due to pain) and tenderness. He exhibits normal strength, no swelling and no deformity.       Lumbar back: He exhibits tenderness. He exhibits normal range of motion, no bony tenderness and no spasm.       Back:       Legs: Lumbar spine with FROM intact without spinous process TTP, no bony stepoffs or deformities, mild b/l paraspinous muscle TTP without palpable muscle spasms. Strength 5/5 in all extremities, sensation  grossly intact in all extremities, neg SLR bilaterally, gait steady and nonantalgic. No overlying skin changes.  R hip with limited ROM due to pain especially with internal rotation, and with TTP in jointline, pain with log-roll test, no swelling or deformity, distal pulses intact  Neurological: He is alert and oriented to person, place, and time. He has normal strength. No sensory deficit. Gait normal.  Skin: Skin is warm, dry and intact. No rash noted.  Psychiatric: He has a normal mood and affect.  Nursing note and vitals reviewed.   ED Course  Procedures (including critical care time) Labs Review Labs Reviewed  CBG MONITORING, ED - Abnormal; Notable for the following:    Glucose-Capillary  106 (*)    All other components within normal limits    Imaging Review Dg Hip Unilat With Pelvis 2-3 Views Right  05/09/2015   CLINICAL DATA:  Chronic right hip pain, initial encounter  EXAM: RIGHT HIP (WITH PELVIS) 2-3 VIEWS  COMPARISON:  None.  FINDINGS: Severe degenerative changes of the right hip joint are noted with narrowing of the superior joint space and significant remodeling of the femoral head. Pelvic ring is intact. No acute fracture is seen.  IMPRESSION: Significant degenerative changes of the right hip joint.   Electronically Signed   By: Inez Catalina M.D.   On: 05/09/2015 07:19     EKG Interpretation None      MDM   Final diagnoses:  Right hip pain  Arthritis of right hip  Essential hypertension  Change in vision    64 y.o. male here with low back pain/R hip pain. 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 States his pain is worse in the AM then improves slightly during the day but hurts to ambulate. All extremities neurovascularly intact with soft compartments, no red flag s/s of low back pain. No s/s of central cord compression or cauda equina. Patient is ambulating without difficulty. Tenderness is to paraspinous muscles and R hip jointline. Given the jointline tenderness, will get xray imaging to eval for degenerative changes which could explain his symptoms. Pt requesting non-narcotic meds since he drives for a living, prefers ibuprofen here. Additionally, he had c/o blurred vision intermittently, states now he's fine. CBG in triage was 106, visual acuity reveals that he needs glasses, and he admits that he used to wear them but hasn't gone in quite some time, states he thinks he just needs glasses. Doubt need for further work up for this. Additionally he was noted to be HTN, no symptoms of this at this time, and BP improved to 150/79 during exam, no further work up needed. Will give this and reassess shortly.  7:43 AM  Xray showing severe degeneration of R  hip, will have him see ortho for further management and likely hip replacement. Pain improved with motrin, will give this script for home. Discussed use of heat and tylenol for pain as well. Pt doesn't want narcotics. Discussed with pt that he needs to f/up with a PCP from resource guide regarding his BP, and his eye doctor regarding his need for updated glasses. Advised not to drive until he did this. I explained the diagnosis and have given explicit precautions to return to the ER including for any other new or worsening symptoms. The patient understands and accepts the medical plan as it's been dictated and I have answered their questions. Discharge instructions concerning home care and prescriptions have been given. The patient is STABLE and is  discharged to home in good condition.  BP 150/79 mmHg  Pulse 82  Temp(Src) 98.2 F (36.8 C) (Oral)  Resp 18  Ht 5\' 9"  (1.753 m)  Wt 236 lb 1 oz (107.077 kg)  BMI 34.84 kg/m2  SpO2 99%  Meds ordered this encounter  Medications  . ibuprofen (ADVIL,MOTRIN) tablet 600 mg    Sig:   . ibuprofen (ADVIL,MOTRIN) 600 MG tablet    Sig: Take 1 tablet (600 mg total) by mouth every 8 (eight) hours as needed for moderate pain.    Dispense:  30 tablet    Refill:  0    Order Specific Question:  Supervising Provider    Answer:  Noemi Chapel [[id number]]       Noboru Bidinger Camprubi-Soms, PA-C 05/09/15 [place], MD 05/09/15 [phone]

## 2015-05-09 NOTE — ED Notes (Addendum)
C/o pain in right flank/hip area that goes down the right leg.  States its been going on for 6 months but is worse this morning.  Also c/o blurred vision.  CBG 106 in triage.

## 2015-11-15 ENCOUNTER — Encounter (HOSPITAL_COMMUNITY): Payer: Self-pay | Admitting: Emergency Medicine

## 2015-11-15 ENCOUNTER — Emergency Department (HOSPITAL_COMMUNITY): Payer: Self-pay

## 2015-11-15 ENCOUNTER — Emergency Department (HOSPITAL_COMMUNITY)
Admission: EM | Admit: 2015-11-15 | Discharge: 2015-11-15 | Disposition: A | Discharge status: Home / Self Care | Payer: Self-pay | Attending: Emergency Medicine | Admitting: Emergency Medicine

## 2015-11-15 DIAGNOSIS — F10129 Alcohol abuse with intoxication, unspecified: Secondary | ICD-10-CM | POA: Insufficient documentation

## 2015-11-15 DIAGNOSIS — S99919A Unspecified injury of unspecified ankle, initial encounter: Secondary | ICD-10-CM | POA: Insufficient documentation

## 2015-11-15 DIAGNOSIS — Y9389 Activity, other specified: Secondary | ICD-10-CM | POA: Insufficient documentation

## 2015-11-15 DIAGNOSIS — Z880 Allergy status to penicillin: Secondary | ICD-10-CM | POA: Insufficient documentation

## 2015-11-15 DIAGNOSIS — Z7901 Long term (current) use of anticoagulants: Secondary | ICD-10-CM | POA: Insufficient documentation

## 2015-11-15 DIAGNOSIS — Y9289 Other specified places as the place of occurrence of the external cause: Secondary | ICD-10-CM | POA: Insufficient documentation

## 2015-11-15 DIAGNOSIS — I10 Essential (primary) hypertension: Secondary | ICD-10-CM | POA: Insufficient documentation

## 2015-11-15 DIAGNOSIS — F17200 Nicotine dependence, unspecified, uncomplicated: Secondary | ICD-10-CM | POA: Insufficient documentation

## 2015-11-15 DIAGNOSIS — S8001XA Contusion of right knee, initial encounter: Secondary | ICD-10-CM | POA: Insufficient documentation

## 2015-11-15 DIAGNOSIS — Z79891 Long term (current) use of opiate analgesic: Secondary | ICD-10-CM | POA: Insufficient documentation

## 2015-11-15 DIAGNOSIS — W01198A Fall on same level from slipping, tripping and stumbling with subsequent striking against other object, initial encounter: Secondary | ICD-10-CM | POA: Insufficient documentation

## 2015-11-15 DIAGNOSIS — Z79899 Other long term (current) drug therapy: Secondary | ICD-10-CM | POA: Insufficient documentation

## 2015-11-15 DIAGNOSIS — Y998 Other external cause status: Secondary | ICD-10-CM | POA: Insufficient documentation

## 2015-11-15 MED ORDER — IBUPROFEN 600 MG PO TABS
600.0000 mg | ORAL_TABLET | Freq: Three times a day (TID) | ORAL | Status: DC | PRN
Start: 2015-11-15 — End: 2016-02-27

## 2015-11-15 NOTE — Discharge Instructions (Signed)

## 2015-11-15 NOTE — ED Notes (Signed)
Bed: [facility] Expected date:  Expected time:  Means of arrival:  Comments: Ems- ETOH 

## 2015-11-15 NOTE — ED Provider Notes (Signed)
CSN: [id number]     Arrival date & time 11/15/15  1900 History   First MD Initiated Contact with Patient 11/15/15 1921     Chief Complaint  Patient presents with  . Fall  . Alcohol Intoxication     (Consider location/radiation/quality/duration/timing/severity/associated sxs/prior Treatment) Patient is a 64 y.o. male presenting with knee pain. The history is provided by the patient. No language interpreter was used.  Knee Pain Location:  Knee Injury: no   Knee location:  R knee Pain details:    Quality:  Aching   Radiates to:  Does not radiate   Severity:  Moderate   Onset quality:  Gradual   Timing:  Constant   Progression:  Worsening Chronicity:  New Foreign body present:  No foreign bodies Relieved by:  Nothing Worsened by:  Nothing tried Ineffective treatments:  None tried Associated symptoms: no back pain and no neck pain   Risk factors: concern for non-accidental trauma    Pt admits to alcohol.  Pt reports he was carrying groceries and fell hitting his knee.  Pt reports he did not hit his head. He did not lose conciousness Past Medical History  Diagnosis Date  . Hypertension    Past Surgical History  Procedure Laterality Date  . Tonsillectomy    . I&d extremity Left 07/17/2014    Procedure: IRRIGATION AND DEBRIDEMENT EXTREMITY;  Surgeon: Marianna Payment, MD;  Location: [place];  Service: Orthopedics;  Laterality: Left;  . I&d extremity Left 07/19/2014    Procedure: IRRIGATION AND DEBRIDEMENT EXTREMITY;  Surgeon: Marianna Payment, MD;  Location: [place];  Service: Orthopedics;  Laterality: Left;  . Orif ankle fracture Left 07/19/2014    Procedure: OPEN REDUCTION INTERNAL FIXATION (ORIF) ANKLE FRACTURE;  Surgeon: Marianna Payment, MD;  Location: [place];  Service: Orthopedics;  Laterality: Left;   Family History  Problem Relation Age of Onset  . Alcohol abuse Mother   . Hyperlipidemia Brother   . Heart disease Sister    Social History  Substance Use Topics  .  Smoking status: Current Some Day Smoker  . Smokeless tobacco: Never Used  . Alcohol Use: Yes     Comment: every now and then    Review of Systems  Musculoskeletal: Negative for back pain and neck pain.  All other systems reviewed and are negative.     Allergies  Penicillins  Home Medications   Prior to Admission medications   Medication Sig Start Date End Date Taking? Authorizing Provider  enoxaparin (LOVENOX) 40 MG/0.4ML injection Inject 0.4 mLs (40 mg total) into the skin daily. 07/19/14   Naiping Ephriam Jenkins, MD  ibuprofen (ADVIL,MOTRIN) 600 MG tablet Take 1 tablet (600 mg total) by mouth every 8 (eight) hours as needed for moderate pain. 05/09/15   Mercedes Camprubi-Soms, PA-C  multivitamin-iron-minerals-folic acid (CENTRUM) chewable tablet Chew 1 tablet by mouth daily.    Historical Provider, MD  oxyCODONE (OXY IR/ROXICODONE) 5 MG immediate release tablet Take 1-3 tablets (5-15 mg total) by mouth every 4 (four) hours as needed. 07/19/14   Naiping Ephriam Jenkins, MD  senna-docusate (SENOKOT S) 8.6-50 MG per tablet Take 1 tablet by mouth at bedtime as needed. 07/19/14   Naiping Ephriam Jenkins, MD   BP 145/86 mmHg  Pulse 84  Temp(Src) 98.6 F (37 C) (Oral)  Resp 18  SpO2 97% Physical Exam  Constitutional: He is oriented to person, place, and time. He appears well-developed and well-nourished.  HENT:  Head: Normocephalic.  Right Ear: External  ear normal.  Nose: Nose normal.  Mouth/Throat: Oropharynx is clear and moist.  Cardiovascular: Normal rate.   Pulmonary/Chest: Effort normal.  Abdominal: Soft.  Neurological: He is alert and oriented to person, place, and time. He has normal reflexes.  Skin: Skin is warm.  Psychiatric: He has a normal mood and affect.    ED Course  Procedures (including critical care time) Labs Review Labs Reviewed - No data to display  Imaging Review Dg Knee Complete 4 Views Right  11/15/2015  CLINICAL DATA:  Patient states he fell on his right side today and is having  right knee pain. Best images possible, patient admits to have been drinking. EXAM: RIGHT KNEE - COMPLETE 4+ VIEW COMPARISON:  01/30/2014 FINDINGS: No fracture or dislocation. No effusion. Tiny marginal spurs from the patellar articular surface and medial margin of the tibial plateau. Old healed proximal fibular shaft fracture. Regional soft tissues unremarkable. IMPRESSION: 1. Negative for fracture or other acute bone abnormality. 2. Early degenerative spurring as above. Electronically Signed   By: Lucrezia Europe M.D.   On: 11/15/2015 20:06   I have personally reviewed and evaluated these images and lab results as part of my medical decision-making.   EKG Interpretation None      MDM Pt has pain in knee and ankle.  Pt declined foot xray.  Pt offered crutches.  He is advised to follow up with Orthopaedist in 3-4 days if pain persist Rx for ibuprofen.    Final diagnoses:  Knee contusion, right, initial encounter   An After Visit Summary was printed and given to the patient.    Fransico Meadow, PA-C 11/15/15 [place], PA-C 11/15/15 [place], MD 11/15/15 [phone]

## 2015-11-15 NOTE — ED Notes (Signed)
Patient presents from home via EMS for fall. Per EMS patient was carrying groceries and had mechanical fall, fell onto 12 pack of beer. C/o, chest wall pain, neck pain, and left leg pain (pins in same). Patient endorses ETOH use.

## 2016-02-20 ENCOUNTER — Other Ambulatory Visit: Payer: Self-pay | Admitting: Orthopaedic Surgery

## 2016-02-24 ENCOUNTER — Inpatient Hospital Stay (HOSPITAL_COMMUNITY): Admission: RE | Admit: 2016-02-24 | Payer: Self-pay | Source: Ambulatory Visit

## 2016-02-27 ENCOUNTER — Encounter (HOSPITAL_COMMUNITY)
Admission: RE | Admit: 2016-02-27 | Discharge: 2016-02-27 | Disposition: A | Discharge status: Home / Self Care | Payer: BLUE CROSS/BLUE SHIELD | Source: Ambulatory Visit | Attending: Orthopaedic Surgery | Admitting: Orthopaedic Surgery

## 2016-02-27 ENCOUNTER — Encounter (HOSPITAL_COMMUNITY): Payer: Self-pay

## 2016-02-27 DIAGNOSIS — Z01812 Encounter for preprocedural laboratory examination: Secondary | ICD-10-CM | POA: Insufficient documentation

## 2016-02-27 DIAGNOSIS — M1611 Unilateral primary osteoarthritis, right hip: Secondary | ICD-10-CM | POA: Diagnosis not present

## 2016-02-27 DIAGNOSIS — I10 Essential (primary) hypertension: Secondary | ICD-10-CM | POA: Insufficient documentation

## 2016-02-27 DIAGNOSIS — Z0183 Encounter for blood typing: Secondary | ICD-10-CM | POA: Diagnosis not present

## 2016-02-27 DIAGNOSIS — Z01818 Encounter for other preprocedural examination: Secondary | ICD-10-CM | POA: Diagnosis not present

## 2016-02-27 LAB — COMPREHENSIVE METABOLIC PANEL
ALBUMIN: 3.5 g/dL (ref 3.5–5.0)
ALT: 20 U/L (ref 17–63)
ANION GAP: 7 (ref 5–15)
AST: 25 U/L (ref 15–41)
Alkaline Phosphatase: 37 U/L — ABNORMAL LOW (ref 38–126)
BILIRUBIN TOTAL: 0.6 mg/dL (ref 0.3–1.2)
BUN: 9 mg/dL (ref 6–20)
CHLORIDE: 105 mmol/L (ref 101–111)
CO2: 27 mmol/L (ref 22–32)
Calcium: 8.8 mg/dL — ABNORMAL LOW (ref 8.9–10.3)
Creatinine, Ser: 0.86 mg/dL (ref 0.61–1.24)
GFR calc Af Amer: >60 mL/min (ref >60)
GFR calc non Af Amer: >60 mL/min (ref >60)
GLUCOSE: 103 mg/dL — AB (ref 65–99)
POTASSIUM: 3.8 mmol/L (ref 3.5–5.1)
Sodium: 139 mmol/L (ref 135–145)
TOTAL PROTEIN: 6.9 g/dL (ref 6.5–8.1)

## 2016-02-27 LAB — CBC WITH DIFFERENTIAL/PLATELET
BASOS ABS: 0 10*3/uL (ref 0.0–0.1)
BASOS PCT: 0 %
EOS ABS: 0.1 10*3/uL (ref 0.0–0.7)
EOS PCT: 2 %
HEMATOCRIT: 39.4 % (ref 39.0–52.0)
Hemoglobin: 13 g/dL (ref 13.0–17.0)
Lymphocytes Relative: 41 %
Lymphs Abs: 1.8 10*3/uL (ref 0.7–4.0)
MCH: 29.5 pg (ref 26.0–34.0)
MCHC: 33 g/dL (ref 30.0–36.0)
MCV: 89.5 fL (ref 78.0–100.0)
MONO ABS: 0.4 10*3/uL (ref 0.1–1.0)
MONOS PCT: 9 %
Neutro Abs: 2.1 10*3/uL (ref 1.7–7.7)
Neutrophils Relative %: 48 %
PLATELETS: 194 10*3/uL (ref 150–400)
RBC: 4.4 MIL/uL (ref 4.22–5.81)
RDW: 13.8 % (ref 11.5–15.5)
WBC: 4.3 10*3/uL (ref 4.0–10.5)

## 2016-02-27 LAB — TYPE AND SCREEN
ABO/RH(D): A POS
ANTIBODY SCREEN: NEGATIVE

## 2016-02-27 LAB — URINALYSIS, ROUTINE W REFLEX MICROSCOPIC
Bilirubin Urine: NEGATIVE
GLUCOSE, UA: NEGATIVE mg/dL
HGB URINE DIPSTICK: NEGATIVE
KETONES UR: NEGATIVE mg/dL
Leukocytes, UA: NEGATIVE
NITRITE: NEGATIVE
PH: 5.5 (ref 5.0–8.0)
PROTEIN: NEGATIVE mg/dL
SPECIFIC GRAVITY, URINE: 1.028 (ref 1.005–1.030)

## 2016-02-27 LAB — ABO/RH: ABO/RH(D): A POS

## 2016-02-27 LAB — APTT: APTT: 28 s (ref 24–37)

## 2016-02-27 LAB — SEDIMENTATION RATE: SED RATE: 25 mm/h — AB (ref 0–16)

## 2016-02-27 LAB — SURGICAL PCR SCREEN
MRSA, PCR: NEGATIVE
Staphylococcus aureus: NEGATIVE

## 2016-02-27 LAB — PROTIME-INR
INR: 1.19 (ref 0.00–1.49)
PROTHROMBIN TIME: 15.3 s — AB (ref 11.6–15.2)

## 2016-02-27 LAB — C-REACTIVE PROTEIN: CRP: <0.5 mg/dL (ref <1.0)

## 2016-02-27 NOTE — Pre-Procedure Instructions (Signed)
Jared Rhodes.  02/27/2016     Your procedure is scheduled on : Friday March 02, 2016 at 12:30 PM.  Report to [facility] Admitting at 10:30 AM.   Call this number if you have problems the morning of surgery: [phone]    Remember:  Do not eat food or drink liquids after midnight.  Take these medicines the morning of surgery with A SIP OF WATER : NONE   Stop taking any vitamins, herbal medications/supplements, Ibuprofen, Advil, Motrin, Aleve, etc today   Do not wear jewelry.  Do not wear lotions, powders, or cologne.    Men may shave face and neck.  Do not bring valuables to the hospital.  [facility] is not responsible for any belongings or valuables.  Contacts, dentures or bridgework may not be worn into surgery.  Leave your suitcase in the car.  After surgery it may be brought to your room.  For patients admitted to the hospital, discharge time will be determined by your treatment team.  Patients discharged the day of surgery will not be allowed to drive home.   Name and phone number of your driver:    Special instructions:  Shower using CHG soap the night before and the morning of your surgery  Please read over the following fact sheets that you were given. Pain Booklet, Coughing and Deep Breathing, Blood Transfusion Information, Total Joint Packet, MRSA Information and Surgical Site Infection Prevention

## 2016-02-27 NOTE — Progress Notes (Signed)
Patient denied having any acute cardiac, pulmonary issues, or having a PCP.

## 2016-03-01 MED ORDER — TRANEXAMIC ACID 1000 MG/10ML IV SOLN
1000.0000 mg | INTRAVENOUS | Status: AC
  Administered 2016-03-02: 1000 mg via INTRAVENOUS
  Filled 2016-03-01: qty 10

## 2016-03-01 MED ORDER — CLINDAMYCIN PHOSPHATE 900 MG/50ML IV SOLN
900.0000 mg | INTRAVENOUS | Status: AC
  Administered 2016-03-02: 900 mg via INTRAVENOUS
  Filled 2016-03-01: qty 50

## 2016-03-02 ENCOUNTER — Inpatient Hospital Stay (HOSPITAL_COMMUNITY): Payer: BLUE CROSS/BLUE SHIELD

## 2016-03-02 ENCOUNTER — Inpatient Hospital Stay (HOSPITAL_COMMUNITY): Payer: BLUE CROSS/BLUE SHIELD | Admitting: Anesthesiology

## 2016-03-02 ENCOUNTER — Encounter (HOSPITAL_COMMUNITY): Payer: Self-pay | Admitting: Anesthesiology

## 2016-03-02 ENCOUNTER — Inpatient Hospital Stay (HOSPITAL_COMMUNITY)
Admission: RE | Admit: 2016-03-02 | Discharge: 2016-03-07 | DRG: 470 | Disposition: A | Discharge status: Skilled Nursing Facility | Payer: BLUE CROSS/BLUE SHIELD | Source: Ambulatory Visit | Attending: Orthopaedic Surgery | Admitting: Orthopaedic Surgery

## 2016-03-02 ENCOUNTER — Encounter (HOSPITAL_COMMUNITY)
Admission: RE | Disposition: A | Discharge status: Skilled Nursing Facility | Payer: Self-pay | Source: Ambulatory Visit | Attending: Orthopaedic Surgery

## 2016-03-02 DIAGNOSIS — F17210 Nicotine dependence, cigarettes, uncomplicated: Secondary | ICD-10-CM | POA: Diagnosis present

## 2016-03-02 DIAGNOSIS — D62 Acute posthemorrhagic anemia: Secondary | ICD-10-CM | POA: Diagnosis not present

## 2016-03-02 DIAGNOSIS — Z79899 Other long term (current) drug therapy: Secondary | ICD-10-CM | POA: Diagnosis not present

## 2016-03-02 DIAGNOSIS — Z7982 Long term (current) use of aspirin: Secondary | ICD-10-CM | POA: Diagnosis not present

## 2016-03-02 DIAGNOSIS — M1611 Unilateral primary osteoarthritis, right hip: Secondary | ICD-10-CM | POA: Diagnosis present

## 2016-03-02 DIAGNOSIS — Z419 Encounter for procedure for purposes other than remedying health state, unspecified: Secondary | ICD-10-CM

## 2016-03-02 DIAGNOSIS — I10 Essential (primary) hypertension: Secondary | ICD-10-CM | POA: Diagnosis present

## 2016-03-02 DIAGNOSIS — Z96649 Presence of unspecified artificial hip joint: Secondary | ICD-10-CM

## 2016-03-02 HISTORY — PX: TOTAL HIP ARTHROPLASTY: SHX124

## 2016-03-02 SURGERY — ARTHROPLASTY, HIP, TOTAL, ANTERIOR APPROACH
Anesthesia: General | Site: Hip | Laterality: Right

## 2016-03-02 MED ORDER — HYDRALAZINE HCL 20 MG/ML IJ SOLN
INTRAMUSCULAR | Status: AC
  Administered 2016-03-02: 5 mg via INTRAVENOUS
  Filled 2016-03-02: qty 1

## 2016-03-02 MED ORDER — LIDOCAINE HCL (CARDIAC) 20 MG/ML IV SOLN
INTRAVENOUS | Status: DC | PRN
Start: 2016-03-02 — End: 2016-03-02
  Administered 2016-03-02: 100 mg via INTRAVENOUS

## 2016-03-02 MED ORDER — POLYETHYLENE GLYCOL 3350 17 G PO PACK: 17.0000 g | PACK | Freq: Every day | ORAL | Status: DC | PRN

## 2016-03-02 MED ORDER — METOCLOPRAMIDE HCL 5 MG PO TABS: 5.0000 mg | ORAL_TABLET | Freq: Three times a day (TID) | ORAL | Status: DC | PRN

## 2016-03-02 MED ORDER — TRANEXAMIC ACID 1000 MG/10ML IV SOLN
2000.0000 mg | INTRAVENOUS | Status: AC
  Administered 2016-03-02: 2000 mg via TOPICAL
  Filled 2016-03-02: qty 20

## 2016-03-02 MED ORDER — ROCURONIUM BROMIDE 100 MG/10ML IV SOLN
INTRAVENOUS | Status: DC | PRN
  Administered 2016-03-02: 50 mg via INTRAVENOUS

## 2016-03-02 MED ORDER — MAGNESIUM CITRATE PO SOLN: 1.0000 | Freq: Once | ORAL | Status: DC | PRN

## 2016-03-02 MED ORDER — ONDANSETRON HCL 4 MG PO TABS: 4.0000 mg | ORAL_TABLET | Freq: Three times a day (TID) | ORAL | Status: DC | PRN

## 2016-03-02 MED ORDER — ACETAMINOPHEN 650 MG RE SUPP: 650.0000 mg | Freq: Four times a day (QID) | RECTAL | Status: DC | PRN

## 2016-03-02 MED ORDER — ASPIRIN EC 325 MG PO TBEC: 325.0000 mg | DELAYED_RELEASE_TABLET | Freq: Two times a day (BID) | ORAL | Status: DC

## 2016-03-02 MED ORDER — METHOCARBAMOL 500 MG PO TABS
500.0000 mg | ORAL_TABLET | Freq: Four times a day (QID) | ORAL | Status: DC | PRN
  Administered 2016-03-05: 500 mg via ORAL
  Filled 2016-03-02: qty 1

## 2016-03-02 MED ORDER — OXYCODONE HCL ER 10 MG PO T12A
10.0000 mg | EXTENDED_RELEASE_TABLET | Freq: Two times a day (BID) | ORAL | Status: DC
  Administered 2016-03-02 – 2016-03-07 (×10): 10 mg via ORAL
  Filled 2016-03-02 (×10): qty 1

## 2016-03-02 MED ORDER — OXYCODONE HCL ER 10 MG PO T12A: 10.0000 mg | EXTENDED_RELEASE_TABLET | Freq: Two times a day (BID) | ORAL | Status: DC

## 2016-03-02 MED ORDER — ACETAMINOPHEN 500 MG PO TABS
1000.0000 mg | ORAL_TABLET | Freq: Four times a day (QID) | ORAL | Status: AC
  Administered 2016-03-02 – 2016-03-03 (×4): 1000 mg via ORAL
  Filled 2016-03-02 (×4): qty 2

## 2016-03-02 MED ORDER — ARTIFICIAL TEARS OP OINT
TOPICAL_OINTMENT | OPHTHALMIC | Status: DC | PRN
  Administered 2016-03-02: 1 via OPHTHALMIC

## 2016-03-02 MED ORDER — DIPHENHYDRAMINE HCL 12.5 MG/5ML PO ELIX: 25.0000 mg | ORAL_SOLUTION | ORAL | Status: DC | PRN

## 2016-03-02 MED ORDER — OXYCODONE HCL 5 MG PO TABS: 5.0000 mg | ORAL_TABLET | ORAL | Status: DC | PRN

## 2016-03-02 MED ORDER — SENNOSIDES-DOCUSATE SODIUM 8.6-50 MG PO TABS: 1.0000 | ORAL_TABLET | Freq: Every evening | ORAL | Status: DC | PRN

## 2016-03-02 MED ORDER — HYDROMORPHONE HCL 1 MG/ML IJ SOLN
INTRAMUSCULAR | Status: AC
  Administered 2016-03-02: 0.5 mg via INTRAVENOUS
  Filled 2016-03-02: qty 1

## 2016-03-02 MED ORDER — SORBITOL 70 % SOLN
30.0000 mL | Freq: Every day | Status: DC | PRN
Start: 2016-03-02 — End: 2016-03-07

## 2016-03-02 MED ORDER — METHOCARBAMOL 750 MG PO TABS: 750.0000 mg | ORAL_TABLET | Freq: Two times a day (BID) | ORAL | Status: DC | PRN

## 2016-03-02 MED ORDER — CHLORHEXIDINE GLUCONATE 4 % EX LIQD: 60.0000 mL | Freq: Once | CUTANEOUS | Status: DC

## 2016-03-02 MED ORDER — SODIUM CHLORIDE 0.9 % IR SOLN
Status: DC | PRN
  Administered 2016-03-02: 1000 mL

## 2016-03-02 MED ORDER — HYDROMORPHONE HCL 1 MG/ML IJ SOLN
0.2500 mg | INTRAMUSCULAR | Status: DC | PRN
  Administered 2016-03-02 (×2): 0.5 mg via INTRAVENOUS

## 2016-03-02 MED ORDER — MIDAZOLAM HCL 2 MG/2ML IJ SOLN
INTRAMUSCULAR | Status: AC
Start: 2016-03-02 — End: 2016-03-02
  Filled 2016-03-02: qty 2

## 2016-03-02 MED ORDER — OXYCODONE HCL 5 MG PO TABS
5.0000 mg | ORAL_TABLET | ORAL | Status: DC | PRN
  Administered 2016-03-05 – 2016-03-07 (×5): 10 mg via ORAL
  Filled 2016-03-02 (×5): qty 2

## 2016-03-02 MED ORDER — ONDANSETRON HCL 4 MG/2ML IJ SOLN
INTRAMUSCULAR | Status: DC | PRN
  Administered 2016-03-02: 4 mg via INTRAVENOUS

## 2016-03-02 MED ORDER — MIDAZOLAM HCL 5 MG/5ML IJ SOLN
INTRAMUSCULAR | Status: DC | PRN
  Administered 2016-03-02 (×2): 1 mg via INTRAVENOUS

## 2016-03-02 MED ORDER — LACTATED RINGERS IV SOLN: INTRAVENOUS | Status: DC

## 2016-03-02 MED ORDER — POVIDONE-IODINE 10 % EX SOLN
CUTANEOUS | Status: DC | PRN
  Administered 2016-03-02: 1 via TOPICAL

## 2016-03-02 MED ORDER — NEOSTIGMINE METHYLSULFATE 10 MG/10ML IV SOLN
INTRAVENOUS | Status: DC | PRN
  Administered 2016-03-02: 5 mg via INTRAVENOUS

## 2016-03-02 MED ORDER — METOCLOPRAMIDE HCL 5 MG/ML IJ SOLN: 5.0000 mg | Freq: Three times a day (TID) | INTRAMUSCULAR | Status: DC | PRN

## 2016-03-02 MED ORDER — CLINDAMYCIN PHOSPHATE 600 MG/50ML IV SOLN
600.0000 mg | Freq: Four times a day (QID) | INTRAVENOUS | Status: AC
  Administered 2016-03-02 – 2016-03-03 (×2): 600 mg via INTRAVENOUS
  Filled 2016-03-02 (×2): qty 50

## 2016-03-02 MED ORDER — METHOCARBAMOL 1000 MG/10ML IJ SOLN
500.0000 mg | Freq: Four times a day (QID) | INTRAVENOUS | Status: DC | PRN
  Filled 2016-03-02: qty 5

## 2016-03-02 MED ORDER — ONDANSETRON HCL 4 MG/2ML IJ SOLN: 4.0000 mg | Freq: Four times a day (QID) | INTRAMUSCULAR | Status: DC | PRN

## 2016-03-02 MED ORDER — SODIUM CHLORIDE 0.9 % IV SOLN
1000.0000 mg | Freq: Once | INTRAVENOUS | Status: AC
  Administered 2016-03-02: 1000 mg via INTRAVENOUS
  Filled 2016-03-02: qty 10

## 2016-03-02 MED ORDER — ACETAMINOPHEN 325 MG PO TABS: 650.0000 mg | ORAL_TABLET | Freq: Four times a day (QID) | ORAL | Status: DC | PRN

## 2016-03-02 MED ORDER — PROPOFOL 10 MG/ML IV BOLUS
INTRAVENOUS | Status: DC | PRN
  Administered 2016-03-02: 200 mg via INTRAVENOUS

## 2016-03-02 MED ORDER — LACTATED RINGERS IV SOLN
INTRAVENOUS | Status: DC
  Administered 2016-03-02 (×3): via INTRAVENOUS

## 2016-03-02 MED ORDER — MENTHOL 3 MG MT LOZG: 1.0000 | LOZENGE | OROMUCOSAL | Status: DC | PRN

## 2016-03-02 MED ORDER — GLYCOPYRROLATE 0.2 MG/ML IJ SOLN
INTRAMUSCULAR | Status: DC | PRN
  Administered 2016-03-02: .8 mg via INTRAVENOUS

## 2016-03-02 MED ORDER — KETOROLAC TROMETHAMINE 30 MG/ML IJ SOLN: 30.0000 mg | Freq: Four times a day (QID) | INTRAMUSCULAR | Status: AC | PRN

## 2016-03-02 MED ORDER — MORPHINE SULFATE (PF) 2 MG/ML IV SOLN: 2.0000 mg | INTRAVENOUS | Status: DC | PRN

## 2016-03-02 MED ORDER — FENTANYL CITRATE (PF) 100 MCG/2ML IJ SOLN
INTRAMUSCULAR | Status: DC | PRN
  Administered 2016-03-02: 50 ug via INTRAVENOUS
  Administered 2016-03-02: 100 ug via INTRAVENOUS
  Administered 2016-03-02 (×2): 50 ug via INTRAVENOUS

## 2016-03-02 MED ORDER — DEXAMETHASONE SODIUM PHOSPHATE 10 MG/ML IJ SOLN
10.0000 mg | Freq: Once | INTRAMUSCULAR | Status: AC
  Administered 2016-03-03: 10 mg via INTRAVENOUS
  Filled 2016-03-02: qty 1

## 2016-03-02 MED ORDER — ONDANSETRON HCL 4 MG PO TABS: 4.0000 mg | ORAL_TABLET | Freq: Four times a day (QID) | ORAL | Status: DC | PRN

## 2016-03-02 MED ORDER — FENTANYL CITRATE (PF) 250 MCG/5ML IJ SOLN
INTRAMUSCULAR | Status: AC
  Filled 2016-03-02: qty 5

## 2016-03-02 MED ORDER — ALUM & MAG HYDROXIDE-SIMETH 200-200-20 MG/5ML PO SUSP: 30.0000 mL | ORAL | Status: DC | PRN

## 2016-03-02 MED ORDER — PHENYLEPHRINE HCL 10 MG/ML IJ SOLN
INTRAMUSCULAR | Status: DC | PRN
  Administered 2016-03-02 (×5): 40 ug via INTRAVENOUS

## 2016-03-02 MED ORDER — PROPOFOL 10 MG/ML IV BOLUS
INTRAVENOUS | Status: AC
  Filled 2016-03-02: qty 20

## 2016-03-02 MED ORDER — SODIUM CHLORIDE 0.9 % IV SOLN: INTRAVENOUS | Status: DC

## 2016-03-02 MED ORDER — ASPIRIN EC 325 MG PO TBEC
325.0000 mg | DELAYED_RELEASE_TABLET | Freq: Two times a day (BID) | ORAL | Status: DC
  Administered 2016-03-02 – 2016-03-07 (×10): 325 mg via ORAL
  Filled 2016-03-02 (×9): qty 1

## 2016-03-02 MED ORDER — 0.9 % SODIUM CHLORIDE (POUR BTL) OPTIME
TOPICAL | Status: DC | PRN
  Administered 2016-03-02: 1000 mL

## 2016-03-02 MED ORDER — PHENOL 1.4 % MT LIQD
1.0000 | OROMUCOSAL | Status: DC | PRN
Start: 2016-03-02 — End: 2016-03-07

## 2016-03-02 MED ORDER — HYDRALAZINE HCL 20 MG/ML IJ SOLN
5.0000 mg | INTRAMUSCULAR | Status: AC
  Administered 2016-03-02 (×3): 5 mg via INTRAVENOUS

## 2016-03-02 SURGICAL SUPPLY — 42 items
CAPT HIP TOTAL 2 ×2 IMPLANT
CELLS DAT CNTRL 66122 CELL SVR (MISCELLANEOUS) ×1 IMPLANT
COVER SURGICAL LIGHT HANDLE (MISCELLANEOUS) ×3 IMPLANT
DRAPE C-ARM 42X72 X-RAY (DRAPES) ×3 IMPLANT
DRAPE IMP U-DRAPE 54X76 (DRAPES) ×3 IMPLANT
DRAPE STERI IOBAN 125X83 (DRAPES) ×3 IMPLANT
DRAPE U-SHAPE 47X51 STRL (DRAPES) ×9 IMPLANT
DRSG AQUACEL AG ADV 3.5X10 (GAUZE/BANDAGES/DRESSINGS) ×3 IMPLANT
ELECT BLADE 4.0 EZ CLEAN MEGAD (MISCELLANEOUS) ×3
ELECT REM PT RETURN 9FT ADLT (ELECTROSURGICAL) ×3
ELECTRODE BLDE 4.0 EZ CLN MEGD (MISCELLANEOUS) ×1 IMPLANT
ELECTRODE REM PT RTRN 9FT ADLT (ELECTROSURGICAL) ×1 IMPLANT
GLOVE SURG SYN 7.5  E (GLOVE) ×2
GLOVE SURG SYN 7.5 E (GLOVE) ×1 IMPLANT
GLOVE SURG SYN 7.5 PF PI (GLOVE) ×1 IMPLANT
GOWN SRG XL XLNG 56XLVL 4 (GOWN DISPOSABLE) ×1 IMPLANT
GOWN STRL NON-REIN XL XLG LVL4 (GOWN DISPOSABLE) ×3
GOWN STRL REUS W/ TWL LRG LVL3 (GOWN DISPOSABLE) IMPLANT
GOWN STRL REUS W/TWL LRG LVL3 (GOWN DISPOSABLE)
HANDPIECE INTERPULSE COAX TIP (DISPOSABLE) ×3
IV NS 1000ML (IV SOLUTION) ×3
IV NS 1000ML BAXH (IV SOLUTION) ×1 IMPLANT
IV NS IRRIG 3000ML ARTHROMATIC (IV SOLUTION) ×3 IMPLANT
KIT BASIN OR (CUSTOM PROCEDURE TRAY) ×3 IMPLANT
MARKER SKIN DUAL TIP RULER LAB (MISCELLANEOUS) ×3 IMPLANT
PACK TOTAL JOINT (CUSTOM PROCEDURE TRAY) ×3 IMPLANT
RETRACTOR WND ALEXIS 18 MED (MISCELLANEOUS) ×1 IMPLANT
RTRCTR WOUND ALEXIS 18CM MED (MISCELLANEOUS) ×3
SAW OSC TIP CART 19.5X105X1.3 (SAW) ×3 IMPLANT
SEALER BIPOLAR AQUA 6.0 (INSTRUMENTS) ×2 IMPLANT
SET HNDPC FAN SPRY TIP SCT (DISPOSABLE) ×1 IMPLANT
SUT ETHIBOND 2 V 37 (SUTURE) ×3 IMPLANT
SUT ETHIBOND NAB CT1 #1 30IN (SUTURE) ×9 IMPLANT
SUT MNCRL AB 4-0 PS2 18 (SUTURE) ×2 IMPLANT
SUT VIC AB 1 CT1 27 (SUTURE) ×3
SUT VIC AB 1 CT1 27XBRD ANBCTR (SUTURE) ×1 IMPLANT
SUT VIC AB 1 CTX 36 (SUTURE) ×3
SUT VIC AB 1 CTX36XBRD ANBCTR (SUTURE) IMPLANT
SUT VIC AB 2-0 CT1 27 (SUTURE) ×3
SUT VIC AB 2-0 CT1 TAPERPNT 27 (SUTURE) ×1 IMPLANT
TOWEL OR 17X26 10 PK STRL BLUE (TOWEL DISPOSABLE) ×3 IMPLANT
YANKAUER SUCT BULB TIP NO VENT (SUCTIONS) ×2 IMPLANT

## 2016-03-02 NOTE — H&P (Signed)
PREOPERATIVE H&P  Chief Complaint: right hip osteoarthritis  HPI: Jared Rhodes. is a 65 y.o. male who presents for surgical treatment of right hip osteoarthritis.  He denies any changes in medical history.  Past Medical History  Diagnosis Date  . Hypertension    Past Surgical History  Procedure Laterality Date  . Tonsillectomy    . I&d extremity Left 07/17/2014    Procedure: IRRIGATION AND DEBRIDEMENT EXTREMITY;  Surgeon: Marianna Payment, MD;  Location: [place];  Service: Orthopedics;  Laterality: Left;  . I&d extremity Left 07/19/2014    Procedure: IRRIGATION AND DEBRIDEMENT EXTREMITY;  Surgeon: Marianna Payment, MD;  Location: [place];  Service: Orthopedics;  Laterality: Left;  . Orif ankle fracture Left 07/19/2014    Procedure: OPEN REDUCTION INTERNAL FIXATION (ORIF) ANKLE FRACTURE;  Surgeon: Marianna Payment, MD;  Location: [place];  Service: Orthopedics;  Laterality: Left;   Social History   Social History  . Marital Status: Legally Separated    Spouse Name: n/a  . Number of Children: 0  . 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 Smoking status: Current Some Day Smoker -- 0.25 packs/day for 30 years    Types: Cigarettes  . Smokeless tobacco: Never Used  . Alcohol Use: Yes     Comment: every now and then  . Drug Use: No  . Sexual Activity:    Partners: Female   Other Topics Concern  . None   Social History Narrative   Lives alone. 2 brothers and 1 sister live in [place].  1 half-sister lives in [place].   Family History  Problem Relation Age of Onset  . Alcohol abuse Mother   . Hyperlipidemia Brother   . Heart disease Sister    Allergies  Allergen Reactions  . Penicillins Rash   Prior to Admission medications   Medication Sig Start Date End Date Taking? Authorizing Provider  aspirin EC 325 MG tablet Take 1 tablet (325 mg total) by mouth 2 (two) times daily. 03/02/16    Leandrew Koyanagi, MD  ibuprofen (ADVIL,MOTRIN) 800 MG tablet Take 800 mg by mouth every 8 (eight) hours as needed for mild pain or moderate pain.    Historical Provider, MD  methocarbamol (ROBAXIN) 750 MG tablet Take 1 tablet (750 mg total) by mouth 2 (two) times daily as needed for muscle spasms. 03/02/16   Leandrew Koyanagi, MD  multivitamin-iron-minerals-folic acid (CENTRUM) chewable tablet Chew 1 tablet by mouth daily.    Historical Provider, MD  ondansetron (ZOFRAN) 4 MG tablet Take 1-2 tablets (4-8 mg total) by mouth every 8 (eight) hours as needed for nausea or vomiting. 03/02/16   Leandrew Koyanagi, MD  oxyCODONE (OXY IR/ROXICODONE) 5 MG immediate release tablet Take 1-3 tablets (5-15 mg total) by mouth every 4 (four) hours as needed. 03/02/16   Leandrew Koyanagi, MD  oxyCODONE (OXYCONTIN) 10 mg 12 hr tablet Take 1 tablet (10 mg total) by mouth every 12 (twelve) hours. 03/02/16   [name], MD  senna-docusate (SENOKOT S) 8.6-50 MG tablet Take 1 tablet by mouth at bedtime as needed. 03/02/16   [name], MD     Positive ROS: All other systems have been reviewed and were otherwise negative with the exception of those mentioned in the HPI and as above.  Physical Exam: General: Alert, no acute distress Cardiovascular: No pedal  edema Respiratory: No cyanosis, no use of accessory musculature GI: abdomen soft Skin: No lesions in the area of chief complaint Neurologic: Sensation intact distally Psychiatric: Patient is competent for consent with normal mood and affect Lymphatic: no lymphedema  MUSCULOSKELETAL: exam stable  Assessment: right hip osteoarthritis  Plan: Plan for Procedure(s): RIGHT TOTAL HIP ARTHROPLASTY ANTERIOR APPROACH  The risks benefits and alternatives were discussed with the patient including but not limited to the risks of nonoperative treatment, versus surgical intervention including infection, bleeding, nerve injury,  blood clots, cardiopulmonary complications, morbidity,  mortality, among others, and they were willing to proceed.   Marianna Payment, MD   03/02/2016 5:45 PM

## 2016-03-02 NOTE — Anesthesia Preprocedure Evaluation (Addendum)
Anesthesia Evaluation  Patient identified by MRN, date of birth, ID band Patient awake    Reviewed: Allergy & Precautions, H&P , NPO status , Patient's Chart, lab work & pertinent test results  Airway Mallampati: III  TM Distance: >3 FB Neck ROM: full    Dental  (+) Dental Advisory Given, Loose, Missing, Poor Dentition Multiple missing front teeth.  Right upper front very loose. 2 or 3 very loose lower front teeth.  Patient told loose teeth likely to come out during procedure and risk of aspiration.:   Pulmonary neg pulmonary ROS, Current Smoker,    Pulmonary exam normal breath sounds clear to auscultation       Cardiovascular hypertension, On Medications negative cardio ROS Normal cardiovascular exam Rhythm:regular     Neuro/Psych negative neurological ROS  negative psych ROS   GI/Hepatic negative GI ROS, Neg liver ROS,   Endo/Other  negative endocrine ROS  Renal/GU negative Renal ROS  negative genitourinary   Musculoskeletal negative musculoskeletal ROS (+)   Abdominal   Peds negative pediatric ROS (+)  Hematology negative hematology ROS (+)   Anesthesia Other Findings See surgeon's H&P   Reproductive/Obstetrics negative OB ROS                            Anesthesia Physical Anesthesia Plan  ASA: II  Anesthesia Plan: General   Post-op Pain Management:    Induction: Intravenous  Airway Management Planned: Oral ETT  Additional Equipment:   Intra-op Plan:   Post-operative Plan: Extubation in OR  Informed Consent:   Plan Discussed with: Surgeon  Anesthesia Plan Comments:         Anesthesia Quick Evaluation

## 2016-03-02 NOTE — Anesthesia Procedure Notes (Signed)
Procedure Name: Intubation Date/Time: 03/02/2016 12:44 PM Performed by: Williemae Area B Pre-anesthesia Checklist: Patient identified, Suction available, Emergency Drugs available and Patient being monitored Patient Re-evaluated:Patient Re-evaluated prior to inductionOxygen Delivery Method: Circle system utilized Preoxygenation: Pre-oxygenation with 100% oxygen Intubation Type: IV induction Ventilation: Mask ventilation without difficulty Laryngoscope Size: Mac and 4 Grade View: Grade II Tube type: Oral Tube size: 7.5 mm Number of attempts: 1 Airway Equipment and Method: Stylet (silk ties to loose lower right front tooth and to loose upper right incisor) Placement Confirmation: ETT inserted through vocal cords under direct vision,  positive ETCO2 and breath sounds checked- equal and bilateral Secured at: 22 (cm at upper gum) cm Tube secured with: Tape Dental Injury: Teeth and Oropharynx as per pre-operative assessment

## 2016-03-02 NOTE — Discharge Instructions (Signed)

## 2016-03-02 NOTE — Transfer of Care (Signed)
Immediate Anesthesia Transfer of Care Note  Patient: Jared Rhodes.  Procedure(s) Performed: Procedure(s): RIGHT TOTAL HIP ARTHROPLASTY ANTERIOR APPROACH (Right)  Patient Location: PACU  Anesthesia Type:General  Level of Consciousness: awake, alert , oriented and patient cooperative  Airway & Oxygen Therapy: Patient Spontanous Breathing and Patient connected to nasal cannula oxygen  Post-op Assessment: Report given to RN, Post -op Vital signs reviewed and stable and Patient moving all extremities  Post vital signs: Reviewed and stable  Last Vitals:  Filed Vitals:   03/02/16 1518 03/02/16 1519  BP:  152/102  Pulse:  80  Temp: 36.4 C   Resp:  23    Complications: No apparent anesthesia complications

## 2016-03-02 NOTE — Op Note (Signed)
RIGHT TOTAL HIP ARTHROPLASTY ANTERIOR APPROACH  Procedure Note Roni Starr.   Hodgkins:[id number]  Pre-op Diagnosis: right hip osteoarthritis     Post-op Diagnosis: same   Operative Procedures  1. Total hip replacement; Right hip; uncemented [id number]   Personnel  Surgeon(s): Leandrew Koyanagi, MD   Anesthesia: general  Prosthesis: Depuy Acetabulum: Pinnacle 54 mm Femur: Corail KA 13 Head: 36 size: +1.5 Liner: +4 Bearing Type: Ceramic on poly  Date of Service: 03/02/2016  Total Hip Arthroplasty (Anterior Approach) Op Note:  After informed consent was obtained and the operative extremity marked in the holding area, the patient was brought back to the operating room and placed supine on the HANA table. Next, the operative extremity was prepped and draped in normal sterile fashion. Surgical timeout occurred verifying patient identification, surgical site, surgical procedure and administration of antibiotics.  A modified anterior Smith-Peterson approach to the hip was performed, using the interval between tensor fascia lata and sartorius.  Dissection was carried bluntly down onto the anterior hip capsule. The lateral femoral circumflex vessels were identified and coagulated. A capsulotomy was performed and the capsular flaps tagged for later repair.  Fluoroscopy was utilized to prepare for the femoral neck cut. The neck osteotomy was performed. The femoral head was removed, the acetabular rim was cleared of soft tissue and attention was turned to reaming the acetabulum.  Sequential reaming was performed under fluoroscopic guidance. We reamed to a size 53 mm, and then impacted the acetabular shell. The liner was then placed after irrigation and attention turned to the femur.  After placing the femoral hook, the leg was taken to externally rotated, extended and adducted position taking care to perform soft tissue releases to allow for adequate mobilization of the femur. Soft tissue was cleared  from the shoulder of the greater trochanter and the hook elevator used to improve exposure of the proximal femur. Sequential broaching performed up to a size 13. Trial neck and head were placed. The leg was brought back up to neutral and the construct reduced. The position and sizing of components, offset and leg lengths were checked using fluoroscopy. Stability of the  construct was checked in extension and external rotation without any subluxation or impingement of prosthesis. We dislocated the prosthesis, dropped the leg back into position, removed trial components, and irrigated copiously. The final stem and head was then placed, the leg brought back up, the system reduced and fluoroscopy used to verify positioning.  We irrigated, obtained hemostasis and closed the capsule using #2 ethibond suture.  Dilute betadyne solution was used. The fascia was closed with #1 vicryl plus, the deep fat layer was closed with 0 vicryl, the subcutaneous layers closed with 2.0 Vicryl Plus and the skin closed with 4.0 monocryl and steri strips. A sterile dressing was applied. The patient was awakened in the operating room and taken to recovery in stable condition.  All sponge, needle, and instrument counts were correct at the end of the case.   Position: supine  Complications: none.  Time Out: performed   Drains/Packing: none  Estimated blood loss: 300 cc  Returned to Recovery Room: in good condition.   Antibiotics: yes   Mechanical VTE (DVT) Prophylaxis: sequential compression devices, TED thigh-high  Chemical VTE (DVT) Prophylaxis: aspirin   Fluid Replacement: see anesthesia record  Specimens Removed: 1 to pathology   Sponge and Instrument Count Correct? yes   PACU: portable radiograph - low AP   Admission: inpatient status, start PT & OT  POD#1  Plan/RTC: Return in 2 weeks for staple removal. Return in 6 weeks to see MD.  Weight Bearing/Load Lower Extremity: full  Hip precautions: none Suture  Removal: 10-14 days  Betadine to incision twice daily once dressing is removed on POD#7  N. Eduard Roux, MD [facility] [phone] 2:41 PM      Implant Name Type Inv. Item Serial No. Manufacturer Lot No. LRB No. Used  ACETABULAR CUP Daisy Blossom 54MM - [id number] Plate ACETABULAR CUP W GRIPTION 54MM  DEPUY [id number] Right 1  APEX HOLE ELIM DEPUY - [id number] Hips APEX HOLE ELIM DEPUY  DEPUY  Right 1  LINER NEUTRAL 54X36MM PLUS 4 - [id number] Hips LINER NEUTRAL 54X36MM PLUS 4  DEPUY [id number] Right 1  STEM Ronalee Belts - [id number] Stem STEM CORAIL KA13  DEPUY [id number] Right 1  HEAD CERAMIC DELTA 36 PLUS 1.5 - [id number] Hips HEAD CERAMIC DELTA 36 PLUS 1.5   DEPUY [id number] Right 1

## 2016-03-03 LAB — BASIC METABOLIC PANEL
ANION GAP: 8 (ref 5–15)
BUN: 8 mg/dL (ref 6–20)
CHLORIDE: 102 mmol/L (ref 101–111)
CO2: 28 mmol/L (ref 22–32)
CREATININE: 0.85 mg/dL (ref 0.61–1.24)
Calcium: 8.6 mg/dL — ABNORMAL LOW (ref 8.9–10.3)
GFR calc non Af Amer: >60 mL/min (ref >60)
Glucose, Bld: 102 mg/dL — ABNORMAL HIGH (ref 65–99)
POTASSIUM: 3.6 mmol/L (ref 3.5–5.1)
SODIUM: 138 mmol/L (ref 135–145)

## 2016-03-03 LAB — CBC
HEMATOCRIT: 32.2 % — AB (ref 39.0–52.0)
HEMOGLOBIN: 10.5 g/dL — AB (ref 13.0–17.0)
MCH: 28.9 pg (ref 26.0–34.0)
MCHC: 32.6 g/dL (ref 30.0–36.0)
MCV: 88.7 fL (ref 78.0–100.0)
Platelets: 192 10*3/uL (ref 150–400)
RBC: 3.63 MIL/uL — AB (ref 4.22–5.81)
RDW: 13.8 % (ref 11.5–15.5)
WBC: 7.3 10*3/uL (ref 4.0–10.5)

## 2016-03-03 NOTE — Evaluation (Signed)
Physical Therapy Evaluation Patient Details Name: Jared Rhodes. MRN: [id number] DOB: 03-09-1951 Today's Date: 03/03/2016   History of Present Illness  Pt is a 65 yo male s/p anterior approach THR on 03/02/16  Clinical Impression  Pt with good initial mobility.  Patient may benefit from further skilled PT to improve mobility, reduce fall risk and incr functional independence.    Follow Up Recommendations Home health PT;Supervision - Intermittent    Equipment Recommendations  Rolling walker with 5" wheels;3in1 (PT)    Recommendations for Other Services OT consult     Precautions / Restrictions Precautions Precautions: Anterior Hip Precaution Booklet Issued: Yes (comment) Precaution Comments: review of precautions with demo to pt, needs reinforcement Restrictions Weight Bearing Restrictions: Yes RLE Weight Bearing: Weight bearing as tolerated      Mobility  Bed Mobility               General bed mobility comments: OOB upon arrival  Transfers Overall transfer level: Needs assistance Equipment used: Rolling walker (2 wheeled) Transfers: Sit to/from [facility] Sit to Stand: Mod assist Stand pivot transfers: Min assist       General transfer comment: mod cues for Rt leg placement and hand positioning  Ambulation/Gait Ambulation/Gait assistance: Min assist Ambulation Distance (Feet): 70 Feet Assistive device: Rolling walker (2 wheeled) Gait Pattern/deviations: Decreased step length - left;Decreased stance time - right;Antalgic        Stairs            Wheelchair Mobility    Modified Rankin (Stroke Patients Only)       Balance Overall balance assessment: Needs assistance Sitting-balance support: No upper extremity supported;Feet supported Sitting balance-Leahy Scale: Good     Standing balance support: Bilateral upper extremity supported Standing balance-Leahy Scale: Poor                                Pertinent Vitals/Pain Pain Assessment: 0-10 Pain Score: 4  Pain Location: Rt hip/thigh Pain Descriptors / Indicators: Sore;Aching Pain Intervention(s): Limited activity within patient's tolerance;Monitored during session    Home Living Family/patient expects to be discharged to:: Private residence Living Arrangements: Other relatives Available Help at Discharge: Family;Available PRN/intermittently (brother drives long-haul trucking) Type of Home: House Home Access: Stairs to enter Entrance Stairs-Rails: Can reach both Technical brewer of Steps: 4 Home Layout: One level Home Equipment: None      Prior Function Level of Independence: Independent               Hand Dominance        Extremity/Trunk Assessment   Upper Extremity Assessment: Overall WFL for tasks assessed           Lower Extremity Assessment: Generalized weakness      Cervical / Trunk Assessment: Normal  Communication   Communication: No difficulties  Cognition Arousal/Alertness: Awake/alert Behavior During Therapy: WFL for tasks assessed/performed Overall Cognitive Status: Within Functional Limits for tasks assessed                      General Comments      Exercises Total Joint Exercises Ankle Circles/Pumps: AROM;Both;10 reps;Seated Long Arc Quad: AAROM;Right;10 reps;Seated Knee Flexion: AROM;Right;10 reps;Standing Marching in Standing: AROM;Right;10 reps;Standing      Assessment/Plan    PT Assessment Patient needs continued PT services  PT Diagnosis Difficulty walking;Generalized weakness;Acute pain   PT Problem List Decreased strength;Decreased range of motion;Decreased activity tolerance;Decreased balance;Decreased  mobility;Decreased knowledge of use of DME;Decreased safety awareness;Decreased knowledge of precautions;Pain  PT Treatment Interventions DME instruction;Gait training;Therapeutic activities;Stair training;Functional mobility training;Therapeutic  exercise;Balance training;Neuromuscular re-education;Patient/family education   PT Goals (Current goals can be found in the Care Plan section) Acute Rehab PT Goals Patient Stated Goal: get this hip stronger PT Goal Formulation: With patient Time For Goal Achievement: 03/10/16 Potential to Achieve Goals: Good    Frequency Min 5X/week   Barriers to discharge Decreased caregiver support brother will be away for days at a time, pt anticipates microwaving meals    Co-evaluation               End of Session Equipment Utilized During Treatment: Gait belt Activity Tolerance: Patient tolerated treatment well Patient left: in chair;with call bell/phone within reach;with family/visitor present           Time: [id number] PT Time Calculation (min) (ACUTE ONLY): 28 min   Charges:   PT Evaluation $PT Eval Low Complexity: 1 Procedure PT Treatments $Therapeutic Exercise: 8-22 mins   PT G CodesMalka So, PT [phone]  Jared Rhodes 03/03/2016, 6:12 PM

## 2016-03-03 NOTE — Progress Notes (Signed)
   Subjective:  Patient reports pain as moderate.  No events.  Objective:   VITALS:   Filed Vitals:   03/02/16 1849 03/02/16 2011 03/03/16 0014 03/03/16 0555  BP: 159/82 151/100 143/90 111/76  Pulse:  110 95 82  Temp:  98.8 F (37.1 C) 100.1 F (37.8 C) 98.6 F (37 C)  TempSrc:  Oral Oral Oral  Resp:  16 16 16   Weight:      SpO2:  99% 100% 100%    Neurologically intact Neurovascular intact Sensation intact distally Intact pulses distally Dorsiflexion/Plantar flexion intact Incision: dressing C/D/I and no drainage No cellulitis present Compartment soft   Lab Results  Component Value Date   WBC 7.3 03/03/2016   HGB 10.5* 03/03/2016   HCT 32.2* 03/03/2016   MCV 88.7 03/03/2016   PLT 192 03/03/2016     Assessment/Plan:  1 Day Post-Op   - Expected postop acute blood loss anemia - will monitor for symptoms - Up with PT/OT - consider CIR - DVT ppx - SCDs, ambulation, asa - WBAT operative extremity - Pain control - Discharge planning - patient is interested in CIR  Marianna Payment 03/03/2016, 8:01 AM [phone]

## 2016-03-04 NOTE — Evaluation (Signed)
Occupational Therapy Evaluation Patient Details Name: Jared Rhodes. MRN: [id number] DOB: [birth year] Today's Date: 03/04/2016    History of Present Illness Pt is a 65 yo male s/p direct anterior approach THR on 03/02/16   Clinical Impression   Pt reports he was independent with ADLs and mobility PTA. Currently pt overall min-mod assist for ADLs and functional mobility. Began safety and ADL education with pt. Pt reports he is concerned about returning directly home secondary to decreased caregiver support and would like to consider post acute rehab options. Feel pt would benefit from post acute rehab in order to achieve a mod I level with ADLs and functional mobility prior to d/c home. Pt would benefit from continued skilled OT to address established goals.    Follow Up Recommendations  SNF;Supervision - Intermittent    Equipment Recommendations  3 in 1 bedside comode    Recommendations for Other Services       Precautions / Restrictions Precautions Precautions:  (Direct anterior hip-orders for no precautions) Restrictions Weight Bearing Restrictions: Yes RLE Weight Bearing: Weight bearing as tolerated      Mobility Bed Mobility               General bed mobility comments: Pt OOB in chair upon arrival.  Transfers Overall transfer level: Needs assistance Equipment used: Rolling walker (2 wheeled) Transfers: Sit to/from Stand Sit to Stand: Min assist         General transfer comment: Min assist for balance in standing. VCs for hand placement.    Balance Overall balance assessment: Needs assistance Sitting-balance support: Feet supported;No upper extremity supported Sitting balance-Leahy Scale: Good     Standing balance support: Bilateral upper extremity supported Standing balance-Leahy Scale: Poor Standing balance comment: RW for support                            ADL Overall ADL's : Needs assistance/impaired Eating/Feeding: Set  up;Sitting   Grooming: Minimal assistance;Standing Grooming Details (indicate cue type and reason): Min assist for balance in standing Upper Body Bathing: Supervision/ safety;Set up;Sitting   Lower Body Bathing: Moderate assistance;Sit to/from stand   Upper Body Dressing : Set up;Supervision/safety;Sitting   Lower Body Dressing: Moderate assistance;Sit to/from stand Lower Body Dressing Details (indicate cue type and reason): Pt able to reach LLE to don sock, unable to reach RLE. Educated on compensatory strategies for LB ADLs.  Toilet Transfer: Minimal assistance;Ambulation;BSC;RW (BSC over toilet) Toilet Transfer Details (indicate cue type and reason): Simulated by transfer from chair. Educated on use of 3 in 1 over toilet. Toileting- Clothing Manipulation and Hygiene: Minimal assistance;Sit to/from stand       Functional mobility during ADLs: Minimal assistance;Rolling walker General ADL Comments: No family present for OT eval. Pt concerned about returning directly home, discussed post acute rehab options and pt is agreeable and would like to pursue rehab prior to home.     Vision     Perception     Praxis      Pertinent Vitals/Pain Pain Assessment: 0-10 Pain Score: 2  Pain Location: R hip Pain Descriptors / Indicators: Sore Pain Intervention(s): Limited activity within patient's tolerance;Monitored during session;Ice applied     Hand Dominance     Extremity/Trunk Assessment Upper Extremity Assessment Upper Extremity Assessment: Overall WFL for tasks assessed   Lower Extremity Assessment Lower Extremity Assessment: Defer to PT evaluation   Cervical / Trunk Assessment Cervical / Trunk Assessment: Normal   Communication Communication  Communication: No difficulties   Cognition Arousal/Alertness: Awake/alert Behavior During Therapy: WFL for tasks assessed/performed Overall Cognitive Status: Within Functional Limits for tasks assessed                      General Comments       Exercises       Shoulder Instructions      Home Living Family/patient expects to be discharged to:: Unsure Living Arrangements: Other relatives Available Help at Discharge: Family;Available PRN/intermittently (brother out of town for weeks at a time) Type of Home: House Home Access: Stairs to enter Technical brewer of Steps: 4 Entrance Stairs-Rails: Can reach both Home Layout: One level     Bathroom Shower/Tub: Tub/shower unit Shower/tub characteristics: Architectural technologist: Standard     Home Equipment: None          Prior Functioning/Environment Level of Independence: Independent             OT Diagnosis: Generalized weakness;Acute pain   OT Problem List: Decreased strength;Decreased activity tolerance;Impaired balance (sitting and/or standing);Decreased knowledge of use of DME or AE;Decreased knowledge of precautions;Pain;Increased edema   OT Treatment/Interventions: Self-care/ADL training;Energy conservation;DME and/or AE instruction;Patient/family education;Balance training    OT Goals(Current goals can be found in the care plan section) Acute Rehab OT Goals Patient Stated Goal: rehab then home OT Goal Formulation: With patient Time For Goal Achievement: 03/18/16 Potential to Achieve Goals: Good ADL Goals Pt Will Perform Grooming: with modified independence;standing Pt Will Perform Upper Body Bathing: with modified independence;sitting Pt Will Perform Lower Body Bathing: with modified independence;sit to/from stand Pt Will Transfer to Toilet: with modified independence;bedside commode;ambulating (BSC over toilet) Pt Will Perform Toileting - Clothing Manipulation and hygiene: with modified independence;sit to/from stand Pt Will Perform Tub/Shower Transfer: Tub transfer;with modified independence;3 in 1;rolling walker  OT Frequency: Min 2X/week   Barriers to D/C: Decreased caregiver support  pt will be home alone most of  the time       Co-evaluation              End of Session Equipment Utilized During Treatment: Rolling walker;Gait belt  Activity Tolerance: Patient tolerated treatment well Patient left: in chair;with call bell/phone within reach   Time: [id number] OT Time Calculation (min): 17 min Charges:  OT General Charges $OT Visit: 1 Procedure OT Evaluation $OT Eval Moderate Complexity: 1 Procedure G-Codes:     Binnie Kand M.S., OTR/L Pager: [phone]  03/04/2016, 10:25 AM

## 2016-03-04 NOTE — Progress Notes (Signed)
Physical Therapy Treatment Patient Details Name: Jared Rhodes. MRN: [id number] DOB: [birth year] Today's Date: 03/04/2016    History of Present Illness Pt is a 65 yo male s/p direct anterior approach THR on 03/02/16    PT Comments    Patient seen for follow up and mobility progression. Patient remains limited with overall progress at this time continues to required assist for stability during ambulation and transfers. Patient states that brother would not be able to assist at home as he is not there all the time, given current mobility level, feel patient may benefit from Time SNF upon acute discharge to improve function and independence to ensure safe d/c.  Follow Up Recommendations  SNF     Equipment Recommendations  Rolling walker with 5" wheels;3in1 (PT)    Recommendations for Other Services OT consult     Precautions / Restrictions Precautions Precautions: Anterior Hip Precaution Booklet Issued: Yes (comment) Precaution Comments: review of precautions with demo to pt, needs reinforcement Restrictions Weight Bearing Restrictions: Yes RLE Weight Bearing: Weight bearing as tolerated    Mobility  Bed Mobility               General bed mobility comments: Pt OOB in chair upon arrival.  Transfers Overall transfer level: Needs assistance Equipment used: Rolling walker (2 wheeled) Transfers: Sit to/from Stand Sit to Stand: Min assist         General transfer comment: VCs for hand placement, min assist for stability  Ambulation/Gait Ambulation/Gait assistance: Min assist Ambulation Distance (Feet): 60 Feet Assistive device: Rolling walker (2 wheeled) Gait Pattern/deviations: Decreased step length - left;Decreased stance time - right;Antalgic         Stairs            Wheelchair Mobility    Modified Rankin (Stroke Patients Only)       Balance Overall balance assessment: Needs assistance Sitting-balance support: Feet supported Sitting  balance-Leahy Scale: Good     Standing balance support: Bilateral upper extremity supported Standing balance-Leahy Scale: Poor Standing balance comment: RW for support                    Cognition Arousal/Alertness: Awake/alert Behavior During Therapy: WFL for tasks assessed/performed Overall Cognitive Status: Within Functional Limits for tasks assessed                      Exercises Total Joint Exercises Ankle Circles/Pumps: AROM;Both;10 reps;Seated    General Comments        Pertinent Vitals/Pain Pain Assessment: 0-10 Pain Score: 2  Pain Location: right hip pain Pain Descriptors / Indicators: Sore Pain Intervention(s): Limited activity within patient's tolerance;Monitored during session;Ice applied    Home Living Family/patient expects to be discharged to:: Unsure Living Arrangements: Other relatives Available Help at Discharge: Family;Available PRN/intermittently (brother out of town for weeks at a time) Type of Home: House Home Access: Stairs to enter Entrance Stairs-Rails: Can reach both Home Layout: One level Home Equipment: None      Prior Function Level of Independence: Independent          PT Goals (current goals can now be found in the care plan section) Acute Rehab PT Goals Patient Stated Goal: rehab then home PT Goal Formulation: With patient Time For Goal Achievement: 03/10/16 Potential to Achieve Goals: Good Progress towards PT goals: Progressing toward goals    Frequency  7X/week    PT Plan Discharge plan needs to be updated    Co-evaluation  End of Session Equipment Utilized During Treatment: Gait belt Activity Tolerance: Patient limited by fatigue;Patient limited by pain Patient left: in chair;with call bell/phone within reach;with family/visitor present     Time: [id number] PT Time Calculation (min) (ACUTE ONLY): 14 min  Charges:  $Gait Training: 8-22 mins                    G CodesDuncan Dull 03-12-16, 2:05 PM Alben Deeds, [place] DPT  [phone]

## 2016-03-04 NOTE — Progress Notes (Signed)
Subjective: 2 Days Post-Op Procedure(s) (LRB): RIGHT TOTAL HIP ARTHROPLASTY ANTERIOR APPROACH (Right) Patient reports pain as moderate.  Working well with therapy, but needing short-term SNF placement.  Objective: Vital signs in last 24 hours: Temp:  [97.9 F (36.6 C)-98.7 F (37.1 C)] 98 F (36.7 C) (03/12 1346) Pulse Rate:  [84-112] 84 (03/12 1346) Resp:  [18] 18 (03/12 1346) BP: (103-150)/(64-96) 103/64 mmHg (03/12 1346) SpO2:  [98 %-100 %] 98 % (03/12 1346)  Intake/Output from previous day: 03/11 0701 - 03/12 0700 In: 1440 [P.O.:1440] Out: 700 [Urine:700] Intake/Output this shift: Total I/O In: 960 [P.O.:960] Out: -    Recent Labs  03/03/16 0529  HGB 10.5*    Recent Labs  03/03/16 0529  WBC 7.3  RBC 3.63*  HCT 32.2*  PLT 192    Recent Labs  03/03/16 0529  NA 138  K 3.6  CL 102  CO2 28  BUN 8  CREATININE 0.85  GLUCOSE 102*  CALCIUM 8.6*   No results for input(s): LABPT, INR in the last 72 hours.  Sensation intact distally Intact pulses distally Dorsiflexion/Plantar flexion intact Incision: scant drainage  Assessment/Plan: 2 Days Post-Op Procedure(s) (LRB): RIGHT TOTAL HIP ARTHROPLASTY ANTERIOR APPROACH (Right) Up with therapy Discharge to SNF possibly tomorrow.  Mcarthur Rossetti 03/04/2016, 5:21 PM

## 2016-03-05 ENCOUNTER — Encounter (HOSPITAL_COMMUNITY): Payer: Self-pay | Admitting: Orthopaedic Surgery

## 2016-03-05 NOTE — Progress Notes (Signed)
   Subjective:  Patient reports pain as mild.  Mainly c/o soreness.  Objective:   VITALS:   Filed Vitals:   03/04/16 0409 03/04/16 1346 03/04/16 2100 03/05/16 0515  BP: 150/96 103/64 126/83 104/62  Pulse: 96 84 92 92  Temp: 97.9 F (36.6 C) 98 F (36.7 C) 99.4 F (37.4 C) 98.8 F (37.1 C)  TempSrc: Oral Oral Oral Oral  Resp: 18 18 18 18   Weight:      SpO2: 100% 98% 100% 95%    Neurologically intact Neurovascular intact Sensation intact distally Intact pulses distally Dorsiflexion/Plantar flexion intact Incision: dressing C/D/I and no drainage No cellulitis present Compartment soft   Lab Results  Component Value Date   WBC 7.3 03/03/2016   HGB 10.5* 03/03/2016   HCT 32.2* 03/03/2016   MCV 88.7 03/03/2016   PLT 192 03/03/2016     Assessment/Plan:  3 Days Post-Op   - Expected postop acute blood loss anemia - will monitor for symptoms - Up with PT/OT - needs short term SNF - DVT ppx - SCDs, ambulation, asa - WBAT operative extremity - Pain controlled - Discharge planning - SNF today if bed available  Marianna Payment 03/05/2016, 7:51 AM [phone]

## 2016-03-05 NOTE — Progress Notes (Signed)
Physical Therapy Treatment Patient Details Name: Jared Rhodes. MRN: [id number] DOB: Dec 24, 1951 Today's Date: 03/05/2016    History of Present Illness Pt is a 65 yo male s/p direct anterior approach THR on 03/02/16    PT Comments    Patient is progressing toward mobility goals and tolerated therex well. Current plan remains appropriate.   Follow Up Recommendations  SNF     Equipment Recommendations  Rolling walker with 5" wheels;3in1 (PT)    Recommendations for Other Services OT consult     Precautions / Restrictions Precautions Precautions: Anterior Hip Precaution Booklet Issued: Yes (comment) Precaution Comments: review of precautions with demo to pt, needs reinforcement Restrictions Weight Bearing Restrictions: Yes RLE Weight Bearing: Weight bearing as tolerated    Mobility  Bed Mobility Overal bed mobility: Needs Assistance Bed Mobility: Supine to Sit     Supine to sit: Min guard;HOB elevated     General bed mobility comments: min use of bedrails; cues for technique  Transfers Overall transfer level: Needs assistance Equipment used: Rolling walker (2 wheeled) Transfers: Sit to/from Stand Sit to Stand: Min assist;Min guard         General transfer comment: assist for stability when transitioning hands from EOB to RW; no LOB; increased time to achieve upright posture  Ambulation/Gait Ambulation/Gait assistance: Min guard Ambulation Distance (Feet): 100 Feet Assistive device: Rolling walker (2 wheeled) Gait Pattern/deviations: Step-through pattern;Decreased stance time - right;Decreased step length - left;Decreased dorsiflexion - right;Antalgic;Trunk flexed Gait velocity: decreased   General Gait Details: cues for posture, R heel strike, and sequencing; pt with tendency to 'inche' R foot forward instead of taking complete step initially; able to correct with verbal and visual cues; two standing rest breaks; heavy reliance on UE support   Stairs             Wheelchair Mobility    Modified Rankin (Stroke Patients Only)       Balance Overall balance assessment: Needs assistance Sitting-balance support: Feet supported;No upper extremity supported Sitting balance-Leahy Scale: Good     Standing balance support: Bilateral upper extremity supported Standing balance-Leahy Scale: Poor                      Cognition Arousal/Alertness: Awake/alert Behavior During Therapy: WFL for tasks assessed/performed Overall Cognitive Status: Within Functional Limits for tasks assessed                      Exercises Total Joint Exercises Quad Sets: AROM;Both;15 reps;Supine Heel Slides: AAROM;Right;10 reps;Supine Hip ABduction/ADduction: AAROM;Right;10 reps;Supine    General Comments        Pertinent Vitals/Pain Pain Assessment: 0-10 Pain Score: 3  Pain Location: R hip Pain Descriptors / Indicators: Sore Pain Intervention(s): Limited activity within patient's tolerance;Monitored during session;Premedicated before session;Repositioned;Ice applied;RN gave pain meds during session    Home Living                      Prior Function            PT Goals (current goals can now be found in the care plan section) Acute Rehab PT Goals Patient Stated Goal: rehab then home PT Goal Formulation: With patient Time For Goal Achievement: 03/10/16 Potential to Achieve Goals: Good Progress towards PT goals: Progressing toward goals    Frequency  7X/week    PT Plan Discharge plan needs to be updated    Co-evaluation  End of Session Equipment Utilized During Treatment: Gait belt Activity Tolerance: Patient tolerated treatment well Patient left: in chair;with call bell/phone within reach     Time: 1140-1205 PT Time Calculation (min) (ACUTE ONLY): 25 min  Charges:  $Gait Training: 8-22 mins $Therapeutic Exercise: 8-22 mins                    G Codes:      [place] April, PTA Pager: [phone]   03/05/2016, 12:17 PM

## 2016-03-05 NOTE — Clinical Social Work Note (Addendum)
CSW met with patient and spoke to his sister Tye [place] on the phone and they would like [facility] for short term rehab.  CSW contacted SNF to inform them that patient would like [facility].  [place] began insurance approval process for patient to go to SNF.  CSW updated physician and case Freight forwarder.  CSW informed patient's sister, that if the facility is unable to get insurance approval, there is a chance patient may have to go to a different facility in [place] or out of county, patient's sister expressed that sheCSW to continue to follow patient's progress.  Jones Broom. [place], MSW, [place] 03/05/2016 1:05 PM

## 2016-03-05 NOTE — Clinical Social Work Note (Addendum)
Clinical Social Work Assessment  Patient Details  Name: Jared Rhodes. MRN: [id number] Date of Birth: 29-Jan-1951  Date of referral:  03/05/16               Reason for consult:  Facility Placement                Permission sought to share information with:  Facility Sport and exercise psychologist, Family Supports Permission granted to share information::  Yes, Verbal Permission Granted  Name::     Jared Rhodes patient's sister [phone]  Agency::  SNF admissions  Relationship::     Contact Information:     Housing/Transportation Living arrangements for the past 2 months:  Single Family Home Source of Information:  Patient Patient Interpreter Needed:  None Criminal Activity/Legal Involvement Pertinent to Current Situation/Hospitalization:  No - Comment as needed Significant Relationships:  Siblings Lives with:  Relatives Do you feel safe going back to the place where you live?  No (Patient feels he needs some short term rehab before he can return back home.) Need for family participation in patient care:  No (Coment) (Patient requests to have his sister involved in decision making for SNF.)  Care giving concerns:  Patient feels he needs some short term rehab before he is able to return back home.   Social Worker assessment / plan: Patient is a 65 year old male who lives with his brother.  Patient is alert and oriented x4 and able to express his feelings.  Patient stated he has been to rehab in the past, and does not want to return to the SNF he was at before.  Patient expressed that he is open to going to other facilities.  Patient requests that his sister be involved in the decision making for where patient will go to.  Patient expressed that he is not really worried about going to SNF, but just does not want to go to the one he was at previously.  CSW explained to patient role of CSW and how insurance will pay for his stay at facility and what process is involved in decision making.  Patient  expressed that he would like to go to a facility in the [place] area.  Patient gave permission for CSW to begin bed search process.   Employment status:  Retired Forensic scientist:  Managed Care PT Recommendations:  [place] / Referral to community resources:     Patient/Family's Response to care:  Patient in agreement to going to SNF for short term rehab.  Patient/Family's Understanding of and Emotional Response to Diagnosis, Current Treatment, and Prognosis:  Patient and family aware of current treatment plan and diagnosis.  Emotional Assessment Appearance:  Appears stated age Attitude/Demeanor/Rapport:    Affect (typically observed):  Appropriate, Calm, Stable Orientation:  Oriented to  Time, Oriented to Place, Oriented to Self, Oriented to Situation Alcohol / Substance use:  Not Applicable Psych involvement (Current and /or in the community):  No (Comment)  Discharge Needs  Concerns to be addressed:  No discharge needs identified Readmission within the last 30 days:  No Current discharge risk:  None Barriers to Discharge:  Insurance Authorization   Anell Barr 03/05/2016, 10:25 AM

## 2016-03-05 NOTE — NC FL2 (Signed)
[place] LEVEL OF CARE SCREENING TOOL     IDENTIFICATION  Patient Name: Jared Rhodes. Birthdate: October 09, 1951 Sex: male Admission Date (Current Location): 03/02/2016  [facility] and [place] Number:  Herbalist and Address:  [facility] [place], [place] [address], [place], [place] [address]      Provider Number: [id number]  Attending Physician Name and Address:  Leandrew Koyanagi, MD  Relative Name and Phone Number:  Jared,Cathy Sister [phone]    Current Level of Care: Hospital Recommended Level of Care: [place] Prior Approval Number:    Date Approved/Denied:   PASRR Number: [id number]  Discharge Plan: SNF    Current Diagnoses: Patient Active Problem List   Diagnosis Date Noted  . Osteoarthritis of right hip 03/02/2016  . Hip joint replacement status 03/02/2016  . Alcohol intoxication ([place]) 07/17/2014  . Type III open fracture dislocation of left ankle joint 07/17/2014  . HTN (hypertension) 04/17/2014  . Severe obesity (BMI >= 40) (HCC) 04/17/2014    Orientation RESPIRATION BLADDER Height & Weight     Self, Situation, Place, Time  Normal Continent Weight: 233 lb 14.4 oz (106.096 kg) Height:     BEHAVIORAL SYMPTOMS/MOOD NEUROLOGICAL BOWEL NUTRITION STATUS      Continent Diet (Regular)  AMBULATORY STATUS COMMUNICATION OF NEEDS Skin   Limited Assist Verbally Surgical wounds                       Personal Care Assistance Level of Assistance  Bathing, Dressing Bathing Assistance: Limited assistance   Dressing Assistance: Limited assistance     Functional Limitations Info             SPECIAL CARE FACTORS FREQUENCY  PT (By licensed PT)     PT Frequency: 5x a week              Contractures      Additional Factors Info  Allergies   Allergies Info: PENICILLINS           Current Medications (03/05/2016):  This is the current hospital active medication list Current  Facility-Administered Medications  Medication Dose Route Frequency Provider Last Rate Last Dose  . 0.9 %  sodium chloride infusion   Intravenous Continuous Naiping Ephriam Jenkins, MD      . acetaminophen (TYLENOL) tablet 650 mg  650 mg Oral Q6H PRN Naiping Ephriam Jenkins, MD       Or  . acetaminophen (TYLENOL) suppository 650 mg  650 mg Rectal Q6H PRN Naiping Ephriam Jenkins, MD      . alum & mag hydroxide-simeth (MAALOX/MYLANTA) 200-200-20 MG/5ML suspension 30 mL  30 mL Oral Q4H PRN Naiping Ephriam Jenkins, MD      . aspirin EC tablet 325 mg  325 mg Oral BID Leandrew Koyanagi, MD   325 mg at 03/05/16 [id number]  . diphenhydrAMINE (BENADRYL) 12.5 MG/5ML elixir 25 mg  25 mg Oral Q4H PRN Naiping Ephriam Jenkins, MD      . lactated ringers infusion   Intravenous Continuous Rod Mae, MD 10 mL/hr at 03/02/16 1008    . magnesium citrate solution 1 Bottle  1 Bottle Oral Once PRN Naiping Ephriam Jenkins, MD      . menthol-cetylpyridinium (CEPACOL) lozenge 3 mg  1 lozenge Oral PRN Naiping Ephriam Jenkins, MD       Or  . phenol (CHLORASEPTIC) mouth spray 1 spray  1 spray Mouth/Throat PRN Naiping Ephriam Jenkins, MD      .  methocarbamol (ROBAXIN) tablet 500 mg  500 mg Oral Q6H PRN Leandrew Koyanagi, MD   500 mg at 03/05/16 0040   Or  . methocarbamol (ROBAXIN) 500 mg in dextrose 5 % 50 mL IVPB  500 mg Intravenous Q6H PRN Naiping Ephriam Jenkins, MD      . metoCLOPramide (REGLAN) tablet 5-10 mg  5-10 mg Oral Q8H PRN Naiping Ephriam Jenkins, MD       Or  . metoCLOPramide (REGLAN) injection 5-10 mg  5-10 mg Intravenous Q8H PRN Naiping Ephriam Jenkins, MD      . morphine 2 MG/ML injection 2 mg  2 mg Intravenous Q2H PRN Naiping Ephriam Jenkins, MD      . ondansetron [facility]) tablet 4 mg  4 mg Oral Q6H PRN Naiping Ephriam Jenkins, MD       Or  . ondansetron (ZOFRAN) injection 4 mg  4 mg Intravenous Q6H PRN Naiping Ephriam Jenkins, MD      . oxyCODONE (Oxy IR/ROXICODONE) immediate release tablet 5-15 mg  5-15 mg Oral Q3H PRN Leandrew Koyanagi, MD   10 mg at 03/05/16 [id number]  . oxyCODONE (OXYCONTIN) 12 hr tablet 10 mg  10 mg Oral Q12H Naiping Ephriam Jenkins, MD   10 mg at 03/04/16 2107   . polyethylene glycol (MIRALAX / GLYCOLAX) packet 17 g  17 g Oral Daily PRN Naiping Ephriam Jenkins, MD      . sorbitol 70 % solution 30 mL  30 mL Oral Daily PRN Naiping Ephriam Jenkins, MD         Discharge Medications: Please see discharge summary for a list of discharge medications.  Relevant Imaging Results:  Relevant Lab Results:   Additional Information SSN [id number]  Jared Rhodes, [place]

## 2016-03-05 NOTE — Anesthesia Postprocedure Evaluation (Signed)
Anesthesia Post Note  Patient: Jared Rhodes.  Procedure(s) Performed: Procedure(s) (LRB): RIGHT TOTAL HIP ARTHROPLASTY ANTERIOR APPROACH (Right)  Patient location during evaluation: PACU Anesthesia Type: General Level of consciousness: awake and alert Pain management: pain level controlled Vital Signs Assessment: post-procedure vital signs reviewed and stable Respiratory status: spontaneous breathing, nonlabored ventilation, respiratory function stable and patient connected to nasal cannula oxygen Cardiovascular status: blood pressure returned to baseline and stable Postop Assessment: no signs of nausea or vomiting Anesthetic complications: no                  Zenaida Deed

## 2016-03-05 NOTE — Discharge Summary (Addendum)
Physician Discharge Summary      Patient ID: Jared Rhodes. MRN: [id number] DOB/AGE: 65-22-1952 65 y.o.  Admit date: 03/02/2016 Discharge date: 03/07/2016  Admission Diagnoses:  <principal problem not specified>  Discharge Diagnoses:  Active Problems:   Osteoarthritis of right hip   Hip joint replacement status   Past Medical History  Diagnosis Date  . Hypertension     Surgeries: Procedure(s): RIGHT TOTAL HIP ARTHROPLASTY ANTERIOR APPROACH on 03/02/2016   Consultants (if any):    Discharged Condition: Improved  Hospital Course: Jared Girdner. is an 65 y.o. male who was admitted 03/02/2016 with a diagnosis of <principal problem not specified> and went to the operating room on 03/02/2016 and underwent the above named procedures.    He was given perioperative antibiotics:      Anti-infectives    Start     Dose/Rate Route Frequency Ordered Stop   03/02/16 2000  clindamycin (CLEOCIN) IVPB 600 mg     600 mg 100 mL/hr over 30 Minutes Intravenous Every 6 hours 03/02/16 1833 03/03/16 0246   03/02/16 1130  clindamycin (CLEOCIN) IVPB 900 mg     900 mg 100 mL/hr over 30 Minutes Intravenous To ShortStay Surgical 03/01/16 0937 03/02/16 1304    .  He was given sequential compression devices, early ambulation, and aspirin for DVT prophylaxis.  He benefited maximally from the hospital stay and there were no complications.    Recent vital signs:  Filed Vitals:   03/06/16 2003 03/07/16 0602  BP: 105/59 132/86  Pulse: 102 88  Temp: 100 F (37.8 C) 99.6 F (37.6 C)  Resp: 17 18    Recent laboratory studies:  Lab Results  Component Value Date   HGB 10.5* 03/03/2016   HGB 13.0 02/27/2016   HGB 11.2* 07/20/2014   Lab Results  Component Value Date   WBC 7.3 03/03/2016   PLT 192 03/03/2016   Lab Results  Component Value Date   INR 1.19 02/27/2016   Lab Results  Component Value Date   NA 138 03/03/2016   K 3.6 03/03/2016   CL 102 03/03/2016   CO2 28  03/03/2016   BUN 8 03/03/2016   CREATININE 0.85 03/03/2016   GLUCOSE 102* 03/03/2016    Discharge Medications:     Medication List    TAKE these medications        aspirin EC 325 MG tablet  Take 1 tablet (325 mg total) by mouth 2 (two) times daily.     ibuprofen 800 MG tablet  Commonly known as:  ADVIL,MOTRIN  Take 800 mg by mouth every 8 (eight) hours as needed for mild pain or moderate pain.     methocarbamol 750 MG tablet  Commonly known as:  ROBAXIN  Take 1 tablet (750 mg total) by mouth 2 (two) times daily as needed for muscle spasms.     multivitamin-iron-minerals-folic acid chewable tablet  Chew 1 tablet by mouth daily.     ondansetron 4 MG tablet  Commonly known as:  ZOFRAN  Take 1-2 tablets (4-8 mg total) by mouth every 8 (eight) hours as needed for nausea or vomiting.     oxyCODONE 5 MG immediate release tablet  Commonly known as:  Oxy IR/ROXICODONE  Take 1-3 tablets (5-15 mg total) by mouth every 4 (four) hours as needed.     oxyCODONE 10 mg 12 hr tablet  Commonly known as:  OXYCONTIN  Take 1 tablet (10 mg total) by mouth every 12 (twelve) hours.  senna-docusate 8.6-50 MG tablet  Commonly known as:  SENOKOT S  Take 1 tablet by mouth at bedtime as needed.        Diagnostic Studies: Dg Pelvis Portable  03/02/2016  CLINICAL DATA:  Patient status post right hip arthroplasty. EXAM: PORTABLE PELVIS 1-2 VIEWS COMPARISON:  Earlier same day FINDINGS: Patient status post right hip arthroplasty. No evidence for acute osseous abnormality. Lumbar spine degenerative changes. Pelvic phleboliths. Soft tissue gas overlying the proximal right femur. IMPRESSION: Patient status post right hip arthroplasty. Electronically Signed   By: Lovey Newcomer M.D.   On: 03/02/2016 15:53   Dg Hip Operative Unilat W Or W/o Pelvis Right  03/02/2016  CLINICAL DATA:  fluoro spot images from anterior approach right hip joint replacement; reported fluoro time 26 seconds EXAM: OPERATIVE right  HIP (WITH PELVIS IF PERFORMED) 4 VIEWS TECHNIQUE: Fluoroscopic spot image(s) were submitted for interpretation post-operatively. COMPARISON:  Preoperative studies of May 09, 2015. FINDINGS: The patient has undergone right hip joint prosthesis placement. Radiographic positioning of the prosthetic components is good. The native bone appears intact. IMPRESSION: The patient has undergone successful anterior approach right hip joint replacement without evidence of immediate postprocedure complication. Electronically Signed   By: David  Martinique M.D.   On: 03/02/2016 14:50    Disposition: 01-Home or Self Care  Discharge Instructions    Call MD / Call 911    Complete by:  As directed   If you experience chest pain or shortness of breath, CALL 911 and be transported to the hospital emergency room.  If you develope a fever above 101.5 F, pus (white drainage) or increased drainage or redness at the wound, or calf pain, call your surgeon's office.     Call MD / Call 911    Complete by:  As directed   If you experience chest pain or shortness of breath, CALL 911 and be transported to the hospital emergency room.  If you develope a fever above 101.5 F, pus (white drainage) or increased drainage or redness at the wound, or calf pain, call your surgeon's office.     Constipation Prevention    Complete by:  As directed   Drink plenty of fluids.  Prune juice may be helpful.  You may use a stool softener, such as Colace (over the counter) 100 mg twice a day.  Use MiraLax (over the counter) for constipation as needed.     Constipation Prevention    Complete by:  As directed   Drink plenty of fluids.  Prune juice may be helpful.  You may use a stool softener, such as Colace (over the counter) 100 mg twice a day.  Use MiraLax (over the counter) for constipation as needed.     Diet - low sodium heart healthy    Complete by:  As directed      Diet - low sodium heart healthy    Complete by:  As directed      Diet  general    Complete by:  As directed      Diet general    Complete by:  As directed      Driving restrictions    Complete by:  As directed   No driving while taking narcotic pain meds.     Driving restrictions    Complete by:  As directed   No driving while taking narcotic pain meds.     Increase activity slowly as tolerated    Complete by:  As directed  Increase activity slowly as tolerated    Complete by:  As directed            Follow-up Information    Follow up with Marianna Payment, MD In 2 weeks.   Specialty:  Orthopedic Surgery   Why:  For suture removal, For wound re-check   Contact information:   [address] [place] [address] [phone]        Signed: Marianna Payment 03/07/2016, 7:22 AM

## 2016-03-05 NOTE — Clinical Social Work Placement (Signed)
   CLINICAL SOCIAL WORK PLACEMENT  NOTE  Date:  03/05/2016  Patient Details  Name: Jared Rhodes. MRN: [id number] Date of Birth: 11-30-1951  Clinical Social Work is seeking post-discharge placement for this patient at the [place] level of care (*CSW will initial, date and re-position this form in  chart as items are completed):  Yes   Patient/family provided with [facility] Work Department's list of facilities offering this level of care within the geographic area requested by the patient (or if unable, by the patient's family).  Yes   Patient/family informed of their freedom to choose among providers that offer the needed level of care, that participate in Medicare, Medicaid or managed care program needed by the patient, have an available bed and are willing to accept the patient.  Yes   Patient/family informed of [facility]'s ownership interest in [facility] and [facility], as well as of the fact that they are under no obligation to receive care at these facilities.  PASRR submitted to EDS on 03/05/16     PASRR number received on       Existing PASRR number confirmed on 03/05/16     FL2 transmitted to all facilities in geographic area requested by pt/family on 03/05/16     FL2 transmitted to all facilities within larger geographic area on       Patient informed that his/her managed care company has contracts with or will negotiate with certain facilities, including the following:            Patient/family informed of bed offers received.  Patient chooses bed at       Physician recommends and patient chooses bed at      Patient to be transferred to   on  .  Patient to be transferred to facility by       Patient family notified on   of transfer.  Name of family member notified:        PHYSICIAN Please sign FL2     Additional Comment:    _______________________________________________ Ross Ludwig,  LCSWA 03/05/2016, 10:37 AM

## 2016-03-06 NOTE — Care Management Note (Signed)
Case Management Note  Patient Details  Name: Jared Rhodes. MRN: [id number] Date of Birth: [birth year]  Subjective/Objective:        S/p right total hip arthroplasty            Action/Plan: PT recommended SNF, referral made to [place], [place] working on SNF placement for short term rehab, patient to d/c today.   Expected Discharge Date:                  Expected Discharge Plan:  Skilled Nursing Facility  In-House Referral:  Clinical Social Work  Discharge planning Services  CM Consult  Post Acute Care Choice:  NA Choice offered to:     DME Arranged:    DME Agency:     HH Arranged:    [place] Agency:     Status of Service:  Completed, signed off  Medicare Important Message Given:    Date Medicare IM Given:    Medicare IM give by:    Date Additional Medicare IM Given:    Additional Medicare Important Message give by:     If discussed at [place] of Stay Meetings, dates discussed:    Additional Comments:  Nila Nephew, RN 03/06/2016, 8:08 AM

## 2016-03-06 NOTE — Progress Notes (Signed)
Attempted report to [facility]. Will give handoff to PTAR. Pt VSS and no issues at this time. A&Ox4. Leanne Chang, RN

## 2016-03-06 NOTE — Progress Notes (Signed)
Patient is stable for discharge to [facility] today.  Medically stable.

## 2016-03-06 NOTE — Clinical Social Work Note (Signed)
Clinical Social Worker informed by [facility] representative that patient has to pay $500 deductible for SNF placement. CSW and RNCM met with patient and family at bedside.  Patient has expressed that he cannot afford $500 deductible. Pt's sister at bedside and also reported that she is unable to afford the deductible.   Patient stated he is unable to arrange living arrangements for physical assistance at this time. Pt's sister stated her home is being renovated at this time and patient is unable to come live with her.   CSW contacted [employer] which is able to make payment arrangements and will begin [facility] authorization.  Family supportive and involved in care. Pt and pt's sister very appreciated social work intervention.   CSW remains available as needed.   Glendon Axe, MSW, LCSWA [phone] 03/06/2016 6:29 PM

## 2016-03-06 NOTE — Progress Notes (Signed)
Physical Therapy Treatment Patient Details Name: Jared Rhodes. MRN: [id number] DOB: Jun 11, 1951 Today's Date: 03/06/2016    History of Present Illness Pt is a 65 yo male s/p direct anterior approach THR on 03/02/16    PT Comments    Patient continues to do well with PT but with increased c/o pain today. Continue to progress as tolerated.   Follow Up Recommendations  SNF     Equipment Recommendations  Rolling walker with 5" wheels;3in1 (PT)    Recommendations for Other Services OT consult     Precautions / Restrictions Precautions Precautions: Anterior Hip Precaution Booklet Issued: Yes (comment) Precaution Comments: review of precautions with demo to pt, needs reinforcement Restrictions Weight Bearing Restrictions: Yes RLE Weight Bearing: Weight bearing as tolerated    Mobility  Bed Mobility Overal bed mobility: Needs Assistance Bed Mobility: Supine to Sit     Supine to sit: Min guard;HOB elevated     General bed mobility comments: use of bedrail and increased time; cues for technique  Transfers Overall transfer level: Needs assistance Equipment used: Rolling walker (2 wheeled) Transfers: Sit to/from Stand Sit to Stand: Min guard;From elevated surface         General transfer comment: min guard for safety; cues for hand placement and posture upon standing  Ambulation/Gait Ambulation/Gait assistance: Min guard Ambulation Distance (Feet): 100 Feet Assistive device: Rolling walker (2 wheeled) Gait Pattern/deviations: Step-through pattern;Decreased stance time - right;Decreased step length - left;Decreased stride length;Antalgic;Trunk flexed Gait velocity: decreased   General Gait Details: 2 standing rest breaks; cues for posture, position of RW, and encouragement to increase R LE weight bearing; pt with heavy reliance on RW for support   Stairs            Wheelchair Mobility    Modified Rankin (Stroke Patients Only)       Balance  Overall balance assessment: Needs assistance Sitting-balance support: No upper extremity supported;Feet supported Sitting balance-Leahy Scale: Good     Standing balance support: Bilateral upper extremity supported Standing balance-Leahy Scale: Fair                      Cognition Arousal/Alertness: Awake/alert Behavior During Therapy: WFL for tasks assessed/performed Overall Cognitive Status: Within Functional Limits for tasks assessed                      Exercises Total Joint Exercises Quad Sets: AROM;Both;15 reps;Supine Heel Slides: Right;10 reps;Supine;AROM Hip ABduction/ADduction: AAROM;Right;10 reps;Supine    General Comments        Pertinent Vitals/Pain Pain Assessment: 0-10 Pain Score: 4  Pain Location: R hip Pain Descriptors / Indicators: Sore Pain Intervention(s): Limited activity within patient's tolerance;Monitored during session;Premedicated before session;Repositioned;Ice applied    Home Living                      Prior Function            PT Goals (current goals can now be found in the care plan section) Acute Rehab PT Goals Patient Stated Goal: rehab then home PT Goal Formulation: With patient Time For Goal Achievement: 03/10/16 Potential to Achieve Goals: Good Progress towards PT goals: Progressing toward goals    Frequency  7X/week    PT Plan Current plan remains appropriate    Co-evaluation             End of Session Equipment Utilized During Treatment: Gait belt Activity Tolerance: Patient tolerated treatment well Patient  left: in chair;with call bell/phone within reach     Time: 1034-1053 PT Time Calculation (min) (ACUTE ONLY): 19 min  Charges:  $Gait Training: 8-22 mins                    G Codes:      Salina April, PTA Pager: [phone]   03/06/2016, 11:02 AM

## 2016-03-06 NOTE — Clinical Social Work Note (Signed)
Clinical Social Worker facilitated patient discharge including contacting patient family and facility to confirm patient discharge plans.  Clinical information faxed to facility and family agreeable with plan.  CSW arranged ambulance transport via [facility] to [facility].  RN to call report prior to discharge.  Clinical Social Worker will sign off for now as social work intervention is no longer needed. Please consult [place] again if new need arises.  Glendon Axe, MSW, LCSWA [phone] 03/06/2016 12:12 PM

## 2016-03-06 NOTE — Clinical Social Work Placement (Addendum)
   CLINICAL SOCIAL WORK PLACEMENT  NOTE  Date:  03/06/2016  Patient Details  Name: Jared Rhodes. MRN: [id number] Date of Birth: 02/17/1951  Clinical Social Work is seeking post-discharge placement for this patient at the [place] level of care (*CSW will initial, date and re-position this form in  chart as items are completed):  Yes   Patient/family provided with [place] Work Department's list of facilities offering this level of care within the geographic area requested by the patient (or if unable, by the patient's family).  Yes   Patient/family informed of their freedom to choose among providers that offer the needed level of care, that participate in Medicare, Medicaid or managed care program needed by the patient, have an available bed and are willing to accept the patient.  Yes   Patient/family informed of [facility]'s ownership interest in [facility] and [facility], as well as of the fact that they are under no obligation to receive care at these facilities.  PASRR submitted to EDS on 03/05/16     PASRR number received on       Existing PASRR number confirmed on 03/05/16     FL2 transmitted to all facilities in geographic area requested by pt/family on 03/05/16     FL2 transmitted to all facilities within larger geographic area on       Patient informed that his/her managed care company has contracts with or will negotiate with certain facilities, including the following:        Yes   Patient/family informed of bed offers received.  Patient chooses bed at [facility] (Updated Evette Cristal, MSW, [place], 03-07-16)       Physician recommends and patient chooses bed at      Patient to be transferred to [facility] on 03/07/16. (Updated Evette Cristal, MSW, [place], 03-07-16)    Patient to be transferred to facility by  Corey Harold ) (Updated Evette Cristal, MSW, [id number], 03-07-16)    Patient family notified on 03-07-16 of transfer.  (Updated Evette Cristal, MSW, [place], 03-07-16)  Name of family member notified:   (Pt's sister, Tye [place] ) (Updated Evette Cristal, MSW, LCSWA, 03-07-16)     PHYSICIAN Please sign FL2, Please prepare priority discharge summary, including medications     Additional Comment:    _______________________________________________ Rozell Searing, LCSW 03/06/2016, 12:12 PM   Jones Broom. Norval Morton, MSW, [facility] 03/07/2016 1:01 PM (Updated Evette Cristal, MSW, [place], 03-07-16)

## 2016-03-07 NOTE — Progress Notes (Signed)
To SNF today.

## 2016-03-07 NOTE — Progress Notes (Signed)
Physical Therapy Treatment Patient Details Name: Jared Rhodes. MRN: [id number] DOB: Oct 21, 1951 Today's Date: 03/07/2016    History of Present Illness Pt is a 65 yo male s/p direct anterior approach THR on 03/02/16    PT Comments    Patient continues to do well with PT. Current plan remains appropriate.   Follow Up Recommendations  SNF     Equipment Recommendations  Rolling walker with 5" wheels;3in1 (PT)    Recommendations for Other Services OT consult     Precautions / Restrictions Precautions Precautions: Anterior Hip Precaution Booklet Issued: Yes (comment) Precaution Comments: review of precautions with demo to pt, needs reinforcement Restrictions Weight Bearing Restrictions: Yes RLE Weight Bearing: Weight bearing as tolerated    Mobility  Bed Mobility               General bed mobility comments: pt OOB in chair upon arrival  Transfers Overall transfer level: Needs assistance Equipment used: Rolling walker (2 wheeled) Transfers: Sit to/from Stand Sit to Stand: Min guard         General transfer comment: carry over of safe hand placement and technique  Ambulation/Gait Ambulation/Gait assistance: Min guard Ambulation Distance (Feet): 120 Feet Assistive device: Rolling walker (2 wheeled) Gait Pattern/deviations: Step-through pattern;Decreased step length - left;Decreased stance time - right;Decreased stride length;Antalgic;Trunk flexed Gait velocity: decreased   General Gait Details: cues for posture, position of RW, and increasing WB on R LE; pt continues to rely heavily on bilat UE support; 2 standing rest breaks   Stairs            Wheelchair Mobility    Modified Rankin (Stroke Patients Only)       Balance Overall balance assessment: Needs assistance Sitting-balance support: No upper extremity supported;Feet supported Sitting balance-Leahy Scale: Good     Standing balance support: Bilateral upper extremity  supported Standing balance-Leahy Scale: Fair                      Cognition Arousal/Alertness: Awake/alert Behavior During Therapy: WFL for tasks assessed/performed Overall Cognitive Status: Within Functional Limits for tasks assessed                      Exercises Total Joint Exercises Hip ABduction/ADduction: Right;10 reps;AROM;Standing Knee Flexion: AROM;Right;10 reps;Standing Marching in Standing: AROM;Right;10 reps;Standing    General Comments General comments (skin integrity, edema, etc.): educated pt on positioning and elevation       Pertinent Vitals/Pain Pain Assessment: 0-10 Pain Score: 2  Pain Location: R hip Pain Descriptors / Indicators: Sore;Tightness Pain Intervention(s): Monitored during session;Premedicated before session;Repositioned;Limited activity within patient's tolerance    Home Living                      Prior Function            PT Goals (current goals can now be found in the care plan section) Acute Rehab PT Goals Patient Stated Goal: rehab then home PT Goal Formulation: With patient Time For Goal Achievement: 03/10/16 Potential to Achieve Goals: Good Progress towards PT goals: Progressing toward goals    Frequency  7X/week    PT Plan Current plan remains appropriate    Co-evaluation             End of Session Equipment Utilized During Treatment: Gait belt Activity Tolerance: Patient tolerated treatment well Patient left: in chair;with call bell/phone within reach     Time: 1200-1218 PT Time Calculation (  min) (ACUTE ONLY): 18 min  Charges:  $Gait Training: 8-22 mins                    G Codes:      Salina April, PTA Pager: [phone]   03/07/2016, 2:57 PM

## 2016-03-07 NOTE — Clinical Social Work Note (Signed)
Patient to be d/c'ed today to [facility].  Patient and family agreeable to plans will transport via ems RN to call report [phone].  Evette Cristal, MSW, [place]

## 2016-03-08 ENCOUNTER — Non-Acute Institutional Stay (SKILLED_NURSING_FACILITY): Payer: BLUE CROSS/BLUE SHIELD | Admitting: Adult Health

## 2016-03-08 ENCOUNTER — Encounter: Payer: Self-pay | Admitting: Adult Health

## 2016-03-08 DIAGNOSIS — I10 Essential (primary) hypertension: Secondary | ICD-10-CM | POA: Diagnosis not present

## 2016-03-08 DIAGNOSIS — Z9660 Presence of unspecified orthopedic joint implant: Secondary | ICD-10-CM | POA: Diagnosis not present

## 2016-03-08 DIAGNOSIS — M1611 Unilateral primary osteoarthritis, right hip: Secondary | ICD-10-CM | POA: Diagnosis not present

## 2016-03-08 DIAGNOSIS — Z96649 Presence of unspecified artificial hip joint: Secondary | ICD-10-CM

## 2016-03-08 NOTE — Progress Notes (Signed)
Patient ID: Jared Rhodes., male   DOB: 03-Nov-1951, 65 y.o.   MRN: [id number]   Facility: [facility]       Allergies  Allergen Reactions  . Penicillins Rash    Chief Complaint  Patient presents with  . Hospitalization Follow-up    Hospital Follow-up    HPI:  He has been hospitalized for right hip replacement due to osteoarthritis. He is here for short term rehab with his goal to return back home. He is not voicing any complaints or concerns at this time stating that his pain is well managed. There are no nursing concerns at this time.    Past Medical History  Diagnosis Date  . Hypertension     Past Surgical History  Procedure Laterality Date  . Tonsillectomy    . I&d extremity Left 07/17/2014    Procedure: IRRIGATION AND DEBRIDEMENT EXTREMITY;  Surgeon: Marianna Payment, MD;  Location: [place];  Service: Orthopedics;  Laterality: Left;  . I&d extremity Left 07/19/2014    Procedure: IRRIGATION AND DEBRIDEMENT EXTREMITY;  Surgeon: Marianna Payment, MD;  Location: [place];  Service: Orthopedics;  Laterality: Left;  . Orif ankle fracture Left 07/19/2014    Procedure: OPEN REDUCTION INTERNAL FIXATION (ORIF) ANKLE FRACTURE;  Surgeon: Marianna Payment, MD;  Location: [place];  Service: Orthopedics;  Laterality: Left;  . Total hip arthroplasty Right 03/02/2016    Procedure: RIGHT TOTAL HIP ARTHROPLASTY ANTERIOR APPROACH;  Surgeon: Leandrew Koyanagi, MD;  Location: [place];  Service: Orthopedics;  Laterality: Right;    VITAL SIGNS BP 155/78 mmHg  Pulse 79  Temp(Src) 99.1 F (37.3 C) (Oral)  Resp 17  Ht 5\' 10"  (1.778 m)  Wt 246 lb 8 oz (111.812 kg)  BMI 35.37 kg/m2  SpO2 96%  Patient's Medications  New Prescriptions   No medications on file  Previous Medications   ASPIRIN EC 325 MG TABLET    Take 1 tablet (325 mg total) by mouth 2 (two) times daily.   IBUPROFEN (ADVIL,MOTRIN) 800 MG TABLET    Take 800 mg by mouth every 8 (eight) hours as needed for mild pain or moderate  pain.   METHOCARBAMOL (ROBAXIN) 750 MG TABLET    Take 1 tablet (750 mg total) by mouth 2 (two) times daily as needed for muscle spasms.   MULTIVITAMIN-IRON-MINERALS-FOLIC ACID (CENTRUM) CHEWABLE TABLET    Chew 1 tablet by mouth daily.   ONDANSETRON (ZOFRAN) 4 MG TABLET    Take 1-2 tablets (4-8 mg total) by mouth every 8 (eight) hours as needed for nausea or vomiting.   OXYCODONE (OXY IR/ROXICODONE) 5 MG IMMEDIATE RELEASE TABLET    Take 1-3 tablets (5-15 mg total) by mouth every 4 (four) hours as needed.   OXYCODONE (OXYCONTIN) 10 MG 12 HR TABLET    Take 1 tablet (10 mg total) by mouth every 12 (twelve) hours.   SENNA-DOCUSATE (SENOKOT S) 8.6-50 MG TABLET    Take 1 tablet by mouth at bedtime as needed.  Modified Medications   No medications on file  Discontinued Medications   No medications on file     SIGNIFICANT DIAGNOSTIC EXAMS  03-02-16: pelvic x-ray: Patient status post right hip arthroplasty.   LABS REVIEWED:   02-27-16: wbc 4.3; hgb 13.0; hct 39.4; mcv 89.5; plt 194; glucose 103; bun 9; creat 0.86; k+ 3.8; na++139; liver normal albumin 3.5; sed rate 25; CRP: <0.5 03-03-16: wbc 7.3; hgb 10.5; hct 32.2; mcv 88.7; plt 192; glucose 102; bun 8; creat 0.85;  k+ 3.6; na++138     Review of Systems  Constitutional: Negative for malaise/fatigue.  Respiratory: Negative for cough and shortness of breath.   Cardiovascular: Negative for chest pain, palpitations and leg swelling.  Gastrointestinal: Negative for heartburn, abdominal pain and constipation.  Musculoskeletal: Negative for myalgias, back pain and joint pain.       Is status post right hip replacement   Skin: Negative.   Neurological: Negative for dizziness.  Psychiatric/Behavioral: The patient is not nervous/anxious.      Physical Exam  Constitutional: He is oriented to person, place, and time. He appears well-developed and well-nourished. No distress.  Morbid obesity   Eyes: Conjunctivae are normal.  Neck: Neck supple. No  JVD present. No thyromegaly present.  Cardiovascular: Normal rate, regular rhythm and intact distal pulses.   Respiratory: Effort normal and breath sounds normal. No respiratory distress. He has no wheezes.  GI: Soft. Bowel sounds are normal. He exhibits no distension. There is no tenderness.  Musculoskeletal: He exhibits no edema.  Able to move all extremities  Is status post right hip replacement   Lymphadenopathy:    He has no cervical adenopathy.  Neurological: He is alert and oriented to person, place, and time.  Skin: Skin is warm and dry. He is not diaphoretic.  Right hip without signs of infection present   Psychiatric: He has a normal mood and affect.       ASSESSMENT/ PLAN:  1. Osteoarthritis right hip: is status post right hip replacement: will continue therapy as directed and will follow up with orthopedics as indicated. Will continue  Asa 325 mg twice daily; oxycontin 10 mg every 12 hours and oxycodone 5-15 mg every 4 hours as needed for pain and has robaxin 750 mg twice daily as needed for spasms.   2. Hypertension: is presently not on medications; will not make changes will monitor his status.     Time spent with patient  50  minutes >50% time spent counseling; reviewing medical record; tests; labs; and developing future plan of care   Ok Edwards NP [facility]  Contact [phone] Monday through Friday 8am- 5pm  After hours call [phone]

## 2016-03-13 ENCOUNTER — Encounter: Payer: Self-pay | Admitting: Internal Medicine

## 2016-03-13 ENCOUNTER — Non-Acute Institutional Stay (SKILLED_NURSING_FACILITY): Payer: BLUE CROSS/BLUE SHIELD | Admitting: Internal Medicine

## 2016-03-13 DIAGNOSIS — Z9660 Presence of unspecified orthopedic joint implant: Secondary | ICD-10-CM | POA: Diagnosis not present

## 2016-03-13 DIAGNOSIS — Z96649 Presence of unspecified artificial hip joint: Secondary | ICD-10-CM

## 2016-03-13 DIAGNOSIS — I10 Essential (primary) hypertension: Secondary | ICD-10-CM | POA: Diagnosis not present

## 2016-03-13 DIAGNOSIS — M1611 Unilateral primary osteoarthritis, right hip: Secondary | ICD-10-CM | POA: Diagnosis not present

## 2016-03-13 NOTE — Progress Notes (Signed)
Patient ID: Jared Europe., male   DOB: [birth year], 65 y.o.   MRN: [id number]    HISTORY AND PHYSICAL   DATE: 03/13/16  Location:  [facility]    Place of Service: SNF [phone])   Extended Emergency Contact Information Primary Emergency Contact: [place] Address: [address]          [place],  [address] [place] Phone: [phone] Relation: Brother Secondary Emergency Contact: [place] Mobile Phone: [phone] Relation: Niece  Advanced Directive information Does patient have an advance directive?: Yes, Type of Advance Directive: Living will, Does patient want to make changes to advanced directive?: No - Patient declined  Chief Complaint  Patient presents with  . New Admit To SNF    HPI:  65 yo male seen today as a new admission into SNF following hospital stay for right hip OA, HTN. He underwent right THR on 3/10th. He was given perioperative abx. No post op complications. He is on ASA for DVT prophylaxis along with TED stockings. He presents to SNF for short term rehab.  He has no c/o today. Pain 4-5/10 on scale. Tolerating PT. He would like long term placement after he completes rehab. He lives with his brother who is a long distance Administrator. States he does not get enough food to eat and is supplementing with snacks from home.   Hypertension - diet controlled. He does not take any medications  Past Medical History  Diagnosis Date  . Hypertension     Past Surgical History  Procedure Laterality Date  . Tonsillectomy    . I&d extremity Left 07/17/2014    Procedure: IRRIGATION AND DEBRIDEMENT EXTREMITY;  Surgeon: Marianna Payment, MD;  Location: [place];  Service: Orthopedics;  Laterality: Left;  . I&d extremity Left 07/19/2014    Procedure: IRRIGATION AND DEBRIDEMENT EXTREMITY;  Surgeon: Marianna Payment, MD;  Location: [place];  Service: Orthopedics;  Laterality: Left;  .  Orif ankle fracture Left 07/19/2014    Procedure: OPEN REDUCTION INTERNAL FIXATION (ORIF) ANKLE FRACTURE;  Surgeon: Marianna Payment, MD;  Location: [place];  Service: Orthopedics;  Laterality: Left;  . Total hip arthroplasty Right 03/02/2016    Procedure: RIGHT TOTAL HIP ARTHROPLASTY ANTERIOR APPROACH;  Surgeon: Leandrew Koyanagi, MD;  Location: [place];  Service: Orthopedics;  Laterality: Right;    Patient Care Team: No Pcp Per Patient as PCP - General (General Practice)  Social History   Social History  . Marital Status: Legally Separated    Spouse Name: n/a  . Number of Children: 0  . 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 Smoking status: Current Some Day Smoker -- 0.25 packs/day for 30 years    Types: Cigarettes  . Smokeless tobacco: Never Used  . Alcohol Use: Yes     Comment: every now and then  . Drug Use: No  . Sexual Activity:    Partners: Female   Other Topics Concern  . Not on file   Social History Narrative   Lives alone. 2 brothers and 1 sister live in [place].  1 half-sister lives in [place].     reports that he has been smoking Cigarettes.  He has a 7.5 pack-year smoking history. He has never used smokeless tobacco. He reports that he drinks alcohol. He reports that he does not use illicit  drugs.  Family History  Problem Relation Age of Onset  . Alcohol abuse Mother   . Hyperlipidemia Brother   . Heart disease Sister    Family Status  Relation Status Death Age  . Mother Deceased 49    cirrhosis  . Father Deceased 84    multisystem organ failure  . Sister Alive   . Brother Alive   . Sister Alive   . Brother Marketing executive History  Administered Date(s) Administered  . PPD Test 03/07/2016  . Tdap 07/17/2014    Allergies  Allergen Reactions  . Penicillins Rash    Medications: Patient's Medications  New Prescriptions   No medications on file  Previous  Medications   ASPIRIN EC 325 MG TABLET    Take 1 tablet (325 mg total) by mouth 2 (two) times daily.   IBUPROFEN (ADVIL,MOTRIN) 800 MG TABLET    Take 800 mg by mouth every 8 (eight) hours as needed for mild pain or moderate pain.   METHOCARBAMOL (ROBAXIN) 750 MG TABLET    Take 1 tablet (750 mg total) by mouth 2 (two) times daily as needed for muscle spasms.   MULTIVITAMIN-IRON-MINERALS-FOLIC ACID (CENTRUM) CHEWABLE TABLET    Chew 1 tablet by mouth daily.   ONDANSETRON (ZOFRAN) 4 MG TABLET    Take 1-2 tablets (4-8 mg total) by mouth every 8 (eight) hours as needed for nausea or vomiting.   OXYCODONE (OXY IR/ROXICODONE) 5 MG IMMEDIATE RELEASE TABLET    Take 1-3 tablets (5-15 mg total) by mouth every 4 (four) hours as needed.   OXYCODONE (OXYCONTIN) 10 MG 12 HR TABLET    Take 1 tablet (10 mg total) by mouth every 12 (twelve) hours.   SENNA-DOCUSATE (SENOKOT S) 8.6-50 MG TABLET    Take 1 tablet by mouth at bedtime as needed.  Modified Medications   No medications on file  Discontinued Medications   No medications on file    Review of Systems  Constitutional: Negative for chills, activity change and fatigue.  HENT: Negative for sore throat and trouble swallowing.   Eyes: Negative for visual disturbance.  Respiratory: Negative for cough, chest tightness and shortness of breath.   Cardiovascular: Positive for leg swelling. Negative for chest pain and palpitations.  Gastrointestinal: Negative for nausea, vomiting, abdominal pain and blood in stool.  Genitourinary: Negative for urgency, frequency and difficulty urinating.  Musculoskeletal: Positive for joint swelling and gait problem. Negative for arthralgias.  Skin: Negative for rash.  Neurological: Negative for weakness and headaches.  Psychiatric/Behavioral: Negative for confusion and sleep disturbance. The patient is not nervous/anxious.     Filed Vitals:   03/13/16 1115  BP: 128/76  Pulse: 86  Temp: 98.3 F (36.8 C)  TempSrc: Oral    Resp: 16  Height: 5' 10"  (1.778 m)  Weight: 246 lb 12.8 oz (111.948 kg)  SpO2: 96%   Body mass index is 35.41 kg/(m^2).  Physical Exam  Constitutional: He is oriented to person, place, and time. He appears well-developed and well-nourished.  HENT:  Mouth/Throat: Oropharynx is clear and moist.  Eyes: Pupils are equal, round, and reactive to light. No scleral icterus.  Neck: Neck supple. Carotid bruit is not present. No thyromegaly present.  Cardiovascular: Normal rate, regular rhythm, normal heart sounds and intact distal pulses.  Exam reveals no gallop and no friction rub.   No murmur heard. +1 pitting RLE edema. No LLE edema. No calf TTP. TED stockings intact b/l.  Pulmonary/Chest: Effort normal and breath  sounds normal. He has no wheezes. He has no rales. He exhibits no tenderness.  Abdominal: Soft. Bowel sounds are normal. He exhibits no distension, no abdominal bruit, no pulsatile midline mass and no mass. There is no tenderness. There is no rebound and no guarding.  Musculoskeletal: He exhibits edema and tenderness.  Right lateral thigh incision healing well. No d/c or redness. Min swelling.   Lymphadenopathy:    He has no cervical adenopathy.  Neurological: He is alert and oriented to person, place, and time.  Skin: Skin is warm and dry. No rash noted.  subcut chin cyst without redness, d/c or TTP  Psychiatric: He has a normal mood and affect. His behavior is normal. Judgment and thought content normal.     Labs reviewed: Admission on 03/02/2016, Discharged on 03/07/2016  Component Date Value Ref Range Status  . WBC 03/03/2016 7.3  4.0 - 10.5 K/uL Final  . RBC 03/03/2016 3.63* 4.22 - 5.81 MIL/uL Final  . Hemoglobin 03/03/2016 10.5* 13.0 - 17.0 g/dL Final  . HCT 03/03/2016 32.2* 39.0 - 52.0 % Final  . MCV 03/03/2016 88.7  78.0 - 100.0 fL Final  . MCH 03/03/2016 28.9  26.0 - 34.0 pg Final  . MCHC 03/03/2016 32.6  30.0 - 36.0 g/dL Final  . RDW 03/03/2016 13.8  11.5 - 15.5  % Final  . Platelets 03/03/2016 192  150 - 400 K/uL Final  . Sodium 03/03/2016 138  135 - 145 mmol/L Final  . Potassium 03/03/2016 3.6  3.5 - 5.1 mmol/L Final  . Chloride 03/03/2016 102  101 - 111 mmol/L Final  . CO2 03/03/2016 28  22 - 32 mmol/L Final  . Glucose, Bld 03/03/2016 102* 65 - 99 mg/dL Final  . BUN 03/03/2016 8  6 - 20 mg/dL Final  . Creatinine, Ser 03/03/2016 0.85  0.61 - 1.24 mg/dL Final  . Calcium 03/03/2016 8.6* 8.9 - 10.3 mg/dL Final  . GFR calc non Af Amer 03/03/2016 >60  >60 mL/min Final  . GFR calc Af Amer 03/03/2016 >60  >60 mL/min Final   Comment: (NOTE) The eGFR has been calculated using the CKD EPI equation. This calculation has not been validated in all clinical situations. eGFR's persistently <60 mL/min signify possible Chronic Kidney Disease.   Georgiann Hahn gap 03/03/2016 8  5 - 15 Final  Hospital Outpatient Visit on 02/27/2016  Component Date Value Ref Range Status  . MRSA, PCR 02/27/2016 NEGATIVE  NEGATIVE Final  . Staphylococcus aureus 02/27/2016 NEGATIVE  NEGATIVE Final   Comment:        The Xpert SA Assay (FDA approved for NASAL specimens in patients over 3 years of age), is one component of a comprehensive surveillance program.  Test performance has been validated by [facility] for patients greater than or equal to 53 year old. It is not intended to diagnose infection nor to guide or monitor treatment.   . CRP 02/27/2016 <0.5  <1.0 mg/dL Final  . Sed Rate 02/27/2016 25* 0 - 16 mm/hr Final  . aPTT 02/27/2016 28  24 - 37 seconds Final  . WBC 02/27/2016 4.3  4.0 - 10.5 K/uL Final  . RBC 02/27/2016 4.40  4.22 - 5.81 MIL/uL Final  . Hemoglobin 02/27/2016 13.0  13.0 - 17.0 g/dL Final  . HCT 02/27/2016 39.4  39.0 - 52.0 % Final  . MCV 02/27/2016 89.5  78.0 - 100.0 fL Final  . MCH 02/27/2016 29.5  26.0 - 34.0 pg Final  . MCHC 02/27/2016 33.0  30.0 - 36.0 g/dL Final  . RDW 02/27/2016 13.8  11.5 - 15.5 % Final  . Platelets 02/27/2016 194  150 - 400  K/uL Final  . Neutrophils Relative % 02/27/2016 48   Final  . Neutro Abs 02/27/2016 2.1  1.7 - 7.7 K/uL Final  . Lymphocytes Relative 02/27/2016 41   Final  . Lymphs Abs 02/27/2016 1.8  0.7 - 4.0 K/uL Final  . Monocytes Relative 02/27/2016 9   Final  . Monocytes Absolute 02/27/2016 0.4  0.1 - 1.0 K/uL Final  . Eosinophils Relative 02/27/2016 2   Final  . Eosinophils Absolute 02/27/2016 0.1  0.0 - 0.7 K/uL Final  . Basophils Relative 02/27/2016 0   Final  . Basophils Absolute 02/27/2016 0.0  0.0 - 0.1 K/uL Final  . Sodium 02/27/2016 139  135 - 145 mmol/L Final  . Potassium 02/27/2016 3.8  3.5 - 5.1 mmol/L Final  . Chloride 02/27/2016 105  101 - 111 mmol/L Final  . CO2 02/27/2016 27  22 - 32 mmol/L Final  . Glucose, Bld 02/27/2016 103* 65 - 99 mg/dL Final  . BUN 02/27/2016 9  6 - 20 mg/dL Final  . Creatinine, Ser 02/27/2016 0.86  0.61 - 1.24 mg/dL Final  . Calcium 02/27/2016 8.8* 8.9 - 10.3 mg/dL Final  . Total Protein 02/27/2016 6.9  6.5 - 8.1 g/dL Final  . Albumin 02/27/2016 3.5  3.5 - 5.0 g/dL Final  . AST 02/27/2016 25  15 - 41 U/L Final  . ALT 02/27/2016 20  17 - 63 U/L Final  . Alkaline Phosphatase 02/27/2016 37* 38 - 126 U/L Final  . Total Bilirubin 02/27/2016 0.6  0.3 - 1.2 mg/dL Final  . GFR calc non Af Amer 02/27/2016 >60  >60 mL/min Final  . GFR calc Af Amer 02/27/2016 >60  >60 mL/min Final   Comment: (NOTE) The eGFR has been calculated using the CKD EPI equation. This calculation has not been validated in all clinical situations. eGFR's persistently <60 mL/min signify possible Chronic Kidney Disease.   . Anion gap 02/27/2016 7  5 - 15 Final  . Prothrombin Time 02/27/2016 15.3* 11.6 - 15.2 seconds Final  . INR 02/27/2016 1.19  0.00 - 1.49 Final  . ABO/RH(D) 02/27/2016 A POS   Final  . Antibody Screen 02/27/2016 NEG   Final  . Sample Expiration 02/27/2016 03/12/2016   Final  . Extend sample reason 02/27/2016 NO TRANSFUSIONS OR PREGNANCY IN THE PAST 3 MONTHS   Final  .  Color, Urine 02/27/2016 AMBER* YELLOW Final   BIOCHEMICALS MAY BE AFFECTED BY COLOR  . APPearance 02/27/2016 CLEAR  CLEAR Final  . Specific Gravity, Urine 02/27/2016 1.028  1.005 - 1.030 Final  . pH 02/27/2016 5.5  5.0 - 8.0 Final  . Glucose, UA 02/27/2016 NEGATIVE  NEGATIVE mg/dL Final  . Hgb urine dipstick 02/27/2016 NEGATIVE  NEGATIVE Final  . Bilirubin Urine 02/27/2016 NEGATIVE  NEGATIVE Final  . Ketones, ur 02/27/2016 NEGATIVE  NEGATIVE mg/dL Final  . Protein, ur 02/27/2016 NEGATIVE  NEGATIVE mg/dL Final  . Nitrite 02/27/2016 NEGATIVE  NEGATIVE Final  . Leukocytes, UA 02/27/2016 NEGATIVE  NEGATIVE Final   MICROSCOPIC NOT DONE ON URINES WITH NEGATIVE PROTEIN, BLOOD, LEUKOCYTES, NITRITE, OR GLUCOSE <1000 mg/dL.  . ABO/RH(D) 02/27/2016 A POS   Final    Dg Pelvis Portable  03/02/2016  CLINICAL DATA:  Patient status post right hip arthroplasty. EXAM: PORTABLE PELVIS 1-2 VIEWS COMPARISON:  Earlier same day FINDINGS: Patient status post right hip arthroplasty.  No evidence for acute osseous abnormality. Lumbar spine degenerative changes. Pelvic phleboliths. Soft tissue gas overlying the proximal right femur. IMPRESSION: Patient status post right hip arthroplasty. Electronically Signed   By: Lovey Newcomer M.D.   On: 03/02/2016 15:53   Dg Hip Operative Unilat W Or W/o Pelvis Right  03/02/2016  CLINICAL DATA:  fluoro spot images from anterior approach right hip joint replacement; reported fluoro time 26 seconds EXAM: OPERATIVE right HIP (WITH PELVIS IF PERFORMED) 4 VIEWS TECHNIQUE: Fluoroscopic spot image(s) were submitted for interpretation post-operatively. COMPARISON:  Preoperative studies of May 09, 2015. FINDINGS: The patient has undergone right hip joint prosthesis placement. Radiographic positioning of the prosthetic components is good. The native bone appears intact. IMPRESSION: The patient has undergone successful anterior approach right hip joint replacement without evidence of immediate  postprocedure complication. Electronically Signed   By: David  Martinique M.D.   On: 03/02/2016 14:50     Assessment/Plan   ICD-9-CM ICD-10-CM   1. Hip joint replacement status V43.64 Z96.60    right on 03/02/16  2. Essential hypertension 401.9 I10   3. Primary osteoarthritis of right hip 715.15 M16.11     Ok to have double portions with meals  Will have dietary meet with pt to develop meal plan  Cont current meds as ordered  F/u with Ortho as scheduled  PT/OT as ordered  GOAL: short term rehab and d/c home vs ALF when medically appropriate. Communicated with pt and nursing.  Will follow  Jared Siwik S. Perlie Gold  [facility] [address] [place], [place] [address] [phone] Cell (Monday-Friday 8 AM - 5 PM) [phone] After 5 PM and follow prompts

## 2016-03-15 ENCOUNTER — Encounter: Payer: Self-pay | Admitting: Adult Health

## 2016-03-15 ENCOUNTER — Non-Acute Institutional Stay (SKILLED_NURSING_FACILITY): Payer: BLUE CROSS/BLUE SHIELD | Admitting: Adult Health

## 2016-03-15 DIAGNOSIS — Z9660 Presence of unspecified orthopedic joint implant: Secondary | ICD-10-CM

## 2016-03-15 DIAGNOSIS — M1611 Unilateral primary osteoarthritis, right hip: Secondary | ICD-10-CM

## 2016-03-15 DIAGNOSIS — Z96649 Presence of unspecified artificial hip joint: Secondary | ICD-10-CM

## 2016-03-15 NOTE — Progress Notes (Signed)
Patient ID: Jared Rhodes., male   DOB: [birth year], 65 y.o.   MRN: [place]:[id number]   Facility: [facility]       Allergies  Allergen Reactions  . Penicillins Rash    Chief Complaint  Patient presents with  . Acute Visit    Right leg edema    HPI:  He is having some right leg swelling. He tells me that he was not expecting to have such swelling present. I did tell him swelling was a normal response to surgery. There is no drainage; redness or warmth present. There are no reports of fever present. He does not complain of increased pain.    Past Medical History  Diagnosis Date  . Hypertension     Past Surgical History  Procedure Laterality Date  . Tonsillectomy    . I&d extremity Left 07/17/2014    Procedure: IRRIGATION AND DEBRIDEMENT EXTREMITY;  Surgeon: Marianna Payment, MD;  Location: [place];  Service: Orthopedics;  Laterality: Left;  . I&d extremity Left 07/19/2014    Procedure: IRRIGATION AND DEBRIDEMENT EXTREMITY;  Surgeon: Marianna Payment, MD;  Location: [place];  Service: Orthopedics;  Laterality: Left;  . Orif ankle fracture Left 07/19/2014    Procedure: OPEN REDUCTION INTERNAL FIXATION (ORIF) ANKLE FRACTURE;  Surgeon: Marianna Payment, MD;  Location: [place];  Service: Orthopedics;  Laterality: Left;  . Total hip arthroplasty Right 03/02/2016    Procedure: RIGHT TOTAL HIP ARTHROPLASTY ANTERIOR APPROACH;  Surgeon: Leandrew Koyanagi, MD;  Location: [place];  Service: Orthopedics;  Laterality: Right;    VITAL SIGNS BP 132/76 mmHg  Pulse 92  Temp(Src) 98.6 F (37 C) (Oral)  Resp 18  Ht 5\' 10"  (1.778 m)  Wt 246 lb (111.585 kg)  BMI 35.30 kg/m2  SpO2 96%  Patient's Medications  New Prescriptions   No medications on file  Previous Medications   ASPIRIN EC 325 MG TABLET    Take 1 tablet (325 mg total) by mouth 2 (two) times daily.   IBUPROFEN (ADVIL,MOTRIN) 800 MG TABLET    Take 800 mg by mouth every 8 (eight) hours as needed for mild pain or moderate pain.   METHOCARBAMOL (ROBAXIN) 750 MG TABLET    Take 1 tablet (750 mg total) by mouth 2 (two) times daily as needed for muscle spasms.   MULTIVITAMIN-IRON-MINERALS-FOLIC ACID (CENTRUM) CHEWABLE TABLET    Chew 1 tablet by mouth daily.   ONDANSETRON (ZOFRAN) 4 MG TABLET    Take 1-2 tablets (4-8 mg total) by mouth every 8 (eight) hours as needed for nausea or vomiting.   OXYCODONE (OXY IR/ROXICODONE) 5 MG IMMEDIATE RELEASE TABLET    Take 1-3 tablets (5-15 mg total) by mouth every 4 (four) hours as needed.   OXYCODONE (OXYCONTIN) 10 MG 12 HR TABLET    Take 1 tablet (10 mg total) by mouth every 12 (twelve) hours.   SENNA-DOCUSATE (SENOKOT S) 8.6-50 MG TABLET    Take 1 tablet by mouth at bedtime as needed.  Modified Medications   No medications on file  Discontinued Medications   No medications on file     SIGNIFICANT DIAGNOSTIC EXAMS  03-02-16: pelvic x-ray: Patient status post right hip arthroplasty.   LABS REVIEWED:   02-27-16: wbc 4.3; hgb 13.0; hct 39.4; mcv 89.5; plt 194; glucose 103; bun 9; creat 0.86; k+ 3.8; na++139; liver normal albumin 3.5; sed rate 25; CRP: <0.5 03-03-16: wbc 7.3; hgb 10.5; hct 32.2; mcv 88.7; plt 192; glucose 102; bun 8; creat 0.85;  k+ 3.6; na++138     Review of Systems  Constitutional: Negative for malaise/fatigue.  Respiratory: Negative for cough and shortness of breath.   Cardiovascular: Negative for chest pain, palpitations and leg swelling.  Gastrointestinal: Negative for heartburn, abdominal pain and constipation.  Musculoskeletal: Negative for myalgias, back pain and joint pain.       Is status post right hip replacement   Skin: Negative.   Neurological: Negative for dizziness.  Psychiatric/Behavioral: The patient is not nervous/anxious.      Physical Exam  Constitutional: He is oriented to person, place, and time. He appears well-developed and well-nourished. No distress.  Morbid obesity   Eyes: Conjunctivae are normal.  Neck: Neck supple. No JVD  present. No thyromegaly present.  Cardiovascular: Normal rate, regular rhythm and intact distal pulses.   Respiratory: Effort normal and breath sounds normal. No respiratory distress. He has no wheezes.  GI: Soft. Bowel sounds are normal. He exhibits no distension. There is no tenderness.  Musculoskeletal: has 3+ right lower extremity edema present   Able to move all extremities  Is status post right hip replacement   Lymphadenopathy:    He has no cervical adenopathy.  Neurological: He is alert and oriented to person, place, and time.  Skin: Skin is warm and dry. He is not diaphoretic.  Right hip without signs of infection present   Psychiatric: He has a normal mood and affect.       ASSESSMENT/ PLAN:  1. Osteoarthritis right hip: is status post right hip replacement: will continue therapy as directed and will follow up with orthopedics as indicated. Will continue  Asa 325 mg twice daily; oxycontin 10 mg every 12 hours and oxycodone 5-15 mg every 4 hours as needed for pain and has robaxin 750 mg twice daily as needed for spasms.  Due to his swelling will get venous doppler to ensure that he does not a dvt. Will monitor      Ok Edwards NP [facility]  Contact [phone] Monday through Friday 8am- 5pm  After hours call [phone]

## 2016-03-16 ENCOUNTER — Other Ambulatory Visit (HOSPITAL_COMMUNITY): Payer: Self-pay | Admitting: Orthopaedic Surgery

## 2016-03-16 ENCOUNTER — Ambulatory Visit (HOSPITAL_COMMUNITY)
Admission: RE | Admit: 2016-03-16 | Discharge: 2016-03-16 | Disposition: A | Discharge status: Home / Self Care | Payer: BLUE CROSS/BLUE SHIELD | Source: Ambulatory Visit | Attending: Orthopaedic Surgery | Admitting: Orthopaedic Surgery

## 2016-03-16 DIAGNOSIS — R52 Pain, unspecified: Secondary | ICD-10-CM | POA: Diagnosis not present

## 2016-03-16 DIAGNOSIS — M7989 Other specified soft tissue disorders: Secondary | ICD-10-CM | POA: Diagnosis not present

## 2016-03-16 DIAGNOSIS — Z9889 Other specified postprocedural states: Secondary | ICD-10-CM | POA: Diagnosis not present

## 2016-03-16 NOTE — Progress Notes (Signed)
VASCULAR LAB PRELIMINARY  PRELIMINARY  PRELIMINARY  PRELIMINARY  Right lower extremity venous duplex completed.    Preliminary report:  Right:  No evidence of DVT, superficial thrombosis, or Baker's cyst.  Dearis Danis, RVS 03/16/2016, 4:49 PM

## 2016-03-23 ENCOUNTER — Non-Acute Institutional Stay: Payer: BLUE CROSS/BLUE SHIELD | Admitting: Adult Health

## 2016-03-23 ENCOUNTER — Encounter: Payer: Self-pay | Admitting: Adult Health

## 2016-03-23 DIAGNOSIS — I10 Essential (primary) hypertension: Secondary | ICD-10-CM

## 2016-03-23 DIAGNOSIS — Z9660 Presence of unspecified orthopedic joint implant: Secondary | ICD-10-CM | POA: Diagnosis not present

## 2016-03-23 DIAGNOSIS — M1611 Unilateral primary osteoarthritis, right hip: Secondary | ICD-10-CM

## 2016-03-23 DIAGNOSIS — Z96649 Presence of unspecified artificial hip joint: Secondary | ICD-10-CM

## 2016-03-23 MED ORDER — OXYCODONE HCL ER 10 MG PO T12A: 10.0000 mg | EXTENDED_RELEASE_TABLET | Freq: Two times a day (BID) | ORAL | Status: DC

## 2016-03-23 MED ORDER — OXYCODONE HCL 5 MG PO TABS: 5.0000 mg | ORAL_TABLET | ORAL | Status: DC | PRN

## 2016-03-23 NOTE — Progress Notes (Signed)
Patient ID: Jared Rhodes., male   DOB: [birth year], 65 y.o.   MRN: [id number]   Facility: [facility]       Allergies  Allergen Reactions  . Penicillins Rash    Chief Complaint  Patient presents with  . Discharge Note    HPI:  He is being discharged to home with home health for pt. He will need a front wheel rolling walker. He will need his prescriptions written and will need to follow up with his orthopedics.  He had been hospitalized for a right hip replacement and was admitted to this facility for short term rehab and is ready to be discharged to home. He is ambulating independently with a walker.    Past Medical History  Diagnosis Date  . Hypertension     Past Surgical History  Procedure Laterality Date  . Tonsillectomy    . I&d extremity Left 07/17/2014    Procedure: IRRIGATION AND DEBRIDEMENT EXTREMITY;  Surgeon: Marianna Payment, MD;  Location: [place];  Service: Orthopedics;  Laterality: Left;  . I&d extremity Left 07/19/2014    Procedure: IRRIGATION AND DEBRIDEMENT EXTREMITY;  Surgeon: Marianna Payment, MD;  Location: [place];  Service: Orthopedics;  Laterality: Left;  . Orif ankle fracture Left 07/19/2014    Procedure: OPEN REDUCTION INTERNAL FIXATION (ORIF) ANKLE FRACTURE;  Surgeon: Marianna Payment, MD;  Location: [place];  Service: Orthopedics;  Laterality: Left;  . Total hip arthroplasty Right 03/02/2016    Procedure: RIGHT TOTAL HIP ARTHROPLASTY ANTERIOR APPROACH;  Surgeon: Leandrew Koyanagi, MD;  Location: [place];  Service: Orthopedics;  Laterality: Right;    VITAL SIGNS BP 132/72 mmHg  Pulse 88  Temp(Src) 98.5 F (36.9 C)  Ht 5\' 10"  (1.778 m)  Wt 246 lb 12.8 oz (111.948 kg)  BMI 35.41 kg/m2  Patient's Medications  New Prescriptions   No medications on file  Previous Medications   ASPIRIN EC 325 MG TABLET    Take 1 tablet (325 mg total) by mouth 2 (two) times daily.   IBUPROFEN (ADVIL,MOTRIN) 800 MG TABLET    Take 800 mg by mouth every 8 (eight)  hours as needed for mild pain or moderate pain.   METHOCARBAMOL (ROBAXIN) 750 MG TABLET    Take 1 tablet (750 mg total) by mouth 2 (two) times daily as needed for muscle spasms.   MULTIVITAMIN-IRON-MINERALS-FOLIC ACID (CENTRUM) CHEWABLE TABLET    Chew 1 tablet by mouth daily.   ONDANSETRON (ZOFRAN) 4 MG TABLET    Take 1-2 tablets (4-8 mg total) by mouth every 8 (eight) hours as needed for nausea or vomiting.   OXYCODONE (OXY IR/ROXICODONE) 5 MG IMMEDIATE RELEASE TABLET    Take 1-3 tablets (5-15 mg total) by mouth every 4 (four) hours as needed.   OXYCODONE (OXYCONTIN) 10 MG 12 HR TABLET    Take 1 tablet (10 mg total) by mouth every 12 (twelve) hours.   SENNA-DOCUSATE (SENOKOT S) 8.6-50 MG TABLET    Take 1 tablet by mouth at bedtime as needed.  Modified Medications   No medications on file  Discontinued Medications   No medications on file     SIGNIFICANT DIAGNOSTIC EXAMS   03-02-16: pelvic x-ray: Patient status post right hip arthroplasty.   LABS REVIEWED:   02-27-16: wbc 4.3; hgb 13.0; hct 39.4; mcv 89.5; plt 194; glucose 103; bun 9; creat 0.86; k+ 3.8; na++139; liver normal albumin 3.5; sed rate 25; CRP: <0.5 03-03-16: wbc 7.3; hgb 10.5; hct 32.2; mcv 88.7; plt  192; glucose 102; bun 8; creat 0.85; k+ 3.6; na++138     Review of Systems  Constitutional: Negative for malaise/fatigue.  Respiratory: Negative for cough and shortness of breath.   Cardiovascular: Negative for chest pain, palpitations and leg swelling.  Gastrointestinal: Negative for heartburn, abdominal pain and constipation.  Musculoskeletal: Negative for myalgias, back pain and joint pain.       Is status post right hip replacement   Skin: Negative.   Neurological: Negative for dizziness.  Psychiatric/Behavioral: The patient is not nervous/anxious.      Physical Exam  Constitutional: He is oriented to person, place, and time. He appears well-developed and well-nourished. No distress.  Morbid obesity   Eyes:  Conjunctivae are normal.  Neck: Neck supple. No JVD present. No thyromegaly present.  Cardiovascular: Normal rate, regular rhythm and intact distal pulses.   Respiratory: Effort normal and breath sounds normal. No respiratory distress. He has no wheezes.  GI: Soft. Bowel sounds are normal. He exhibits no distension. There is no tenderness.  Musculoskeletal: He exhibits no edema.  Able to move all extremities  Is status post right hip replacement   Lymphadenopathy:    He has no cervical adenopathy.  Neurological: He is alert and oriented to person, place, and time.  Skin: Skin is warm and dry. He is not diaphoretic.  Right hip without signs of infection present   Psychiatric: He has a normal mood and affect.    ASSESSMENT/ PLAN:  Will discharge him to home with home health for pt to evaluate and treat as indicated for strength and gait. His prescriptions have been written for a 30 day supply of his medications with #30 oxycontin 10 mg tabs; #30 oxycodone 5 mg tabs.  He has a follow up with orthopedics setup. The facility will contact [facility] in order to establish with a pcp.   Time spent with patient  45  minutes >50% time spent counseling; reviewing medical record; tests; labs; and developing future plan of care   Ok Edwards NP [facility]  Contact [phone] Monday through Friday 8am- 5pm  After hours call [phone]

## 2016-03-29 DIAGNOSIS — Z96641 Presence of right artificial hip joint: Secondary | ICD-10-CM | POA: Diagnosis not present

## 2016-03-29 DIAGNOSIS — Z471 Aftercare following joint replacement surgery: Secondary | ICD-10-CM | POA: Diagnosis not present

## 2016-03-29 DIAGNOSIS — I10 Essential (primary) hypertension: Secondary | ICD-10-CM | POA: Diagnosis not present

## 2016-12-14 ENCOUNTER — Other Ambulatory Visit (HOSPITAL_COMMUNITY): Payer: Self-pay | Admitting: Urology

## 2016-12-14 DIAGNOSIS — C61 Malignant neoplasm of prostate: Secondary | ICD-10-CM

## 2016-12-24 DIAGNOSIS — C61 Malignant neoplasm of prostate: Secondary | ICD-10-CM

## 2016-12-24 HISTORY — DX: Malignant neoplasm of prostate: C61

## 2016-12-31 ENCOUNTER — Encounter (HOSPITAL_COMMUNITY): Payer: Medicare Other

## 2017-01-14 ENCOUNTER — Encounter (HOSPITAL_COMMUNITY)
Admission: RE | Admit: 2017-01-14 | Discharge: 2017-01-14 | Disposition: A | Discharge status: Home / Self Care | Payer: Medicare Other | Source: Ambulatory Visit | Attending: Urology | Admitting: Urology

## 2017-01-14 ENCOUNTER — Ambulatory Visit (HOSPITAL_COMMUNITY)
Admission: RE | Admit: 2017-01-14 | Discharge: 2017-01-14 | Disposition: A | Discharge status: Home / Self Care | Payer: Medicare Other | Source: Ambulatory Visit | Attending: Urology | Admitting: Urology

## 2017-01-14 DIAGNOSIS — C61 Malignant neoplasm of prostate: Secondary | ICD-10-CM | POA: Diagnosis present

## 2017-01-14 MED ORDER — TECHNETIUM TC 99M MEDRONATE IV KIT
20.2000 | PACK | Freq: Once | INTRAVENOUS | Status: AC | PRN
  Administered 2017-01-14: 20.2 via INTRAVENOUS

## 2017-01-23 ENCOUNTER — Encounter: Payer: Self-pay | Admitting: Radiation Oncology

## 2017-01-24 ENCOUNTER — Telehealth: Payer: Self-pay | Admitting: Medical Oncology

## 2017-01-24 NOTE — Progress Notes (Signed)
Left message requesting a return call to discuss his referral to the [facility] [place].

## 2017-01-25 ENCOUNTER — Encounter: Payer: Self-pay | Admitting: *Deleted

## 2017-01-25 ENCOUNTER — Encounter: Payer: Self-pay | Admitting: Medical Oncology

## 2017-01-28 ENCOUNTER — Encounter: Payer: Self-pay | Admitting: Medical Oncology

## 2017-01-28 NOTE — Progress Notes (Signed)
Left a voicemail requesting a return call regarding referral to the [facility].  

## 2017-01-28 NOTE — Progress Notes (Signed)
Requested prostate biopsy slides from [facility]

## 2017-01-30 ENCOUNTER — Encounter: Payer: Self-pay | Admitting: Radiation Oncology

## 2017-01-30 NOTE — Progress Notes (Signed)
GU Location of Tumor / Histology: prostatic adenocarcinoma  If Prostate Cancer, Gleason Score is (4 + 4) and PSA is (238.8)  Jared Rhodes. was referred by Dr. Velna Hatchet to Dr. Alinda Money November 2017 for evaluation of an elevated PSA.  Biopsies of prostate (if applicable) revealed:    Past/Anticipated interventions by urology, if any: prostate biopsy, CT negative, Bone scan negatie discuss about ADT and external beam radiation, referral to [facility]  Past/Anticipated interventions by medical oncology, if any: no  Weight changes, if any: no  Bowel/Bladder complaints, if any:  "minimal LUTS"  Nausea/Vomiting, if any: no  Pain issues, if any: no    SAFETY ISSUES:  Prior radiation? no  Pacemaker/ICD? no  Possible current pregnancy? no  Is the patient on methotrexate? no  Current Complaints / other details:  66 year old male. Married.

## 2017-01-31 ENCOUNTER — Telehealth: Payer: Self-pay | Admitting: Medical Oncology

## 2017-01-31 NOTE — Progress Notes (Signed)
I called pt's brother (emergency contact) to see if there is another number I can reach Mr. Gelvin at other than the one listed. I have attempted to call him for the past week without success. He states that he has tried to call his brother and has not been able to reach him either. He will have his sister go over and ask him to call me.

## 2017-01-31 NOTE — Progress Notes (Signed)
Jared Rhodes called me and stated his phone is broken. He informed Dr. Alinda Money to text him but he is unable to receive calls. I informed him that I did not receive his message so that is why his brother contacted him. I  introduced myself as the Prostate Nurse Navigator and the Coordinator of the [facility]. I confirmed with the patient he is aware of his referral to the clinic 02/01/17 arriving at 7:45am. We discussed the format of the clinic and the physicians he will see. All questions were answered.  He did received the packet of medical forms that I mailed last week  and he will bring them to the clinic in the morning. I asked him to call me with any questions or concerns. He voiced understanding.

## 2017-01-31 NOTE — Progress Notes (Signed)
I have attempted to reach Jared Rhodes without success. I mailed him a packet of information and medical forms for the [facility]. I included the appointment date, time and how to reach me.

## 2017-02-01 ENCOUNTER — Ambulatory Visit
Admission: RE | Admit: 2017-02-01 | Discharge: 2017-02-01 | Disposition: A | Discharge status: Home / Self Care | Payer: Medicare Other | Source: Ambulatory Visit | Attending: Radiation Oncology | Admitting: Radiation Oncology

## 2017-02-01 ENCOUNTER — Encounter: Payer: Self-pay | Admitting: General Practice

## 2017-02-01 ENCOUNTER — Ambulatory Visit (HOSPITAL_BASED_OUTPATIENT_CLINIC_OR_DEPARTMENT_OTHER): Payer: Medicare Other | Admitting: Oncology

## 2017-02-01 ENCOUNTER — Encounter: Payer: Self-pay | Admitting: Medical Oncology

## 2017-02-01 DIAGNOSIS — I10 Essential (primary) hypertension: Secondary | ICD-10-CM | POA: Insufficient documentation

## 2017-02-01 DIAGNOSIS — C61 Malignant neoplasm of prostate: Secondary | ICD-10-CM | POA: Diagnosis present

## 2017-02-01 DIAGNOSIS — Z7982 Long term (current) use of aspirin: Secondary | ICD-10-CM | POA: Diagnosis not present

## 2017-02-01 DIAGNOSIS — F17210 Nicotine dependence, cigarettes, uncomplicated: Secondary | ICD-10-CM | POA: Insufficient documentation

## 2017-02-01 DIAGNOSIS — Z880 Allergy status to penicillin: Secondary | ICD-10-CM | POA: Diagnosis not present

## 2017-02-01 HISTORY — DX: Malignant neoplasm of prostate: C61

## 2017-02-01 NOTE — Progress Notes (Signed)
Reason for Referral: Prostate cancer.  HPI: 66 year old gentleman currently of [place] where he lived the majority of his life. Gentleman without any significant comorbid conditions also he has not been seeking routine medical care. He was found to have an elevated PSA on a routine physical care provider. At that time his PSA was 238. He did have a worrisome digital rectal examination in November 2017. Based on these findings patient was referred to Dr. Alinda Money and a repeat PSA continue to show persistently high 178. He underwent a prostate biopsy on 12/07/2017 which showed high volume disease with a Gleason score 4+4 = 8 and 11 out of 12 cores. Despite the high-volume disease, he is asymptomatic from his cancer. He does not report any frequency or urgency or nocturia. He does not report incontinence or dysuria. 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  He does not report any headaches, blurry vision, syncope or seizures. He does not report any fevers, chills or sweats. He does not report any cough, wheezing or hemoptysis. He does not report any nausea, vomiting or abdominal pain. He does not report any musca skeletal complaints or arthralgias or myalgias. Remaining review of systems unremarkable.   Past Medical History:  Diagnosis Date  . Hypertension   . Prostate cancer [facility])   :  Past Surgical History:  Procedure Laterality Date  . I&D EXTREMITY Left 07/17/2014   Procedure: IRRIGATION AND DEBRIDEMENT EXTREMITY;  Surgeon: Marianna Payment, MD;  Location: [place];  Service: Orthopedics;  Laterality: Left;  . I&D EXTREMITY Left 07/19/2014   Procedure: IRRIGATION AND DEBRIDEMENT EXTREMITY;  Surgeon: Marianna Payment, MD;  Location: [place];  Service: Orthopedics;  Laterality: Left;  . ORIF ANKLE FRACTURE Left 07/19/2014   Procedure: OPEN REDUCTION INTERNAL FIXATION (ORIF) ANKLE FRACTURE;  Surgeon: Marianna Payment, MD;  Location: [place];  Service: Orthopedics;   Laterality: Left;  . PROSTATE BIOPSY    . TONSILLECTOMY    . TOTAL HIP ARTHROPLASTY Right 03/02/2016   Procedure: RIGHT TOTAL HIP ARTHROPLASTY ANTERIOR APPROACH;  Surgeon: Leandrew Koyanagi, MD;  Location: [place];  Service: Orthopedics;  Laterality: Right;  :   Current Outpatient Prescriptions:  .  aspirin EC 325 MG tablet, Take 1 tablet (325 mg total) by mouth 2 (two) times daily., Disp: 84 tablet, Rfl: 0 .  ibuprofen (ADVIL,MOTRIN) 800 MG tablet, Take 800 mg by mouth every 8 (eight) hours as needed for mild pain or moderate pain., Disp: , Rfl:  .  methocarbamol (ROBAXIN) 750 MG tablet, Take 1 tablet (750 mg total) by mouth 2 (two) times daily as needed for muscle spasms., Disp: 60 tablet, Rfl: 0 .  multivitamin-iron-minerals-folic acid (CENTRUM) chewable tablet, Chew 1 tablet by mouth daily., Disp: , Rfl:  .  ondansetron (ZOFRAN) 4 MG tablet, Take 1-2 tablets (4-8 mg total) by mouth every 8 (eight) hours as needed for nausea or vomiting., Disp: 40 tablet, Rfl: 0 .  oxyCODONE (OXY IR/ROXICODONE) 5 MG immediate release tablet, Take 1-3 tablets (5-15 mg total) by mouth every 4 (four) hours as needed., Disp: 30 tablet, Rfl: 0 .  oxyCODONE (OXYCONTIN) 10 mg 12 hr tablet, Take 1 tablet (10 mg total) by mouth every 12 (twelve) hours., Disp: 30 tablet, Rfl: 0 .  senna-docusate (SENOKOT S) 8.6-50 MG tablet, Take 1 tablet by mouth at bedtime as needed., Disp: 30 tablet, Rfl: 1:  Allergies  Allergen Reactions  . Penicillins Rash  :  Family History  Problem  Relation Age of Onset  . Alcohol abuse Mother   . Hyperlipidemia Brother   . Heart disease Sister   :  Social History   Social History  . Marital status: Legally Separated    Spouse name: n/a  . Number of children: 0  . Years of education: 11th grade   Occupational History  . 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 Smoking status: Current Some Day Smoker    Packs/day: 0.25    Years: 30.00     Types: Cigarettes  . Smokeless tobacco: Never Used  . Alcohol use Yes     Comment: every now and then  . Drug use: No  . Sexual activity: Yes    Partners: Female   Other Topics Concern  . Not on file   Social History Narrative   Lives alone. 2 brothers and 1 sister live in [place].  1 half-sister lives in [place].  :  Pertinent items are noted in HPI.  Exam: ECOG 0 General appearance: alert and cooperative appeared without distress. Throat: lips, mucosa, and tongue normal; teeth and gums normal Neck: no adenopathy Back: negative Resp: clear to auscultation bilaterally without rhonchi, wheezes or dullness to percussion. Cardio: regular rate and rhythm, S1, S2 normal, no murmur, click, rub or gallop GI: soft, non-tender; bowel sounds normal; no masses,  no organomegaly Extremities: extremities normal, atraumatic, no cyanosis or edema Pulses: 2+ and symmetric  CBC    Component Value Date/Time   WBC 7.3 03/03/2016 0529   RBC 3.63 (L) 03/03/2016 0529   HGB 10.5 (L) 03/03/2016 0529   HCT 32.2 (L) 03/03/2016 0529   PLT 192 03/03/2016 0529   MCV 88.7 03/03/2016 0529   MCH 28.9 03/03/2016 0529   MCHC 32.6 03/03/2016 0529   RDW 13.8 03/03/2016 0529   LYMPHSABS 1.8 02/27/2016 1021   MONOABS 0.4 02/27/2016 1021   EOSABS 0.1 02/27/2016 1021   BASOSABS 0.0 02/27/2016 1021     Nm Bone Scan Whole Body  Result Date: 01/14/2017 CLINICAL DATA:  Newly diagnosed prostate carcinoma. EXAM: NUCLEAR MEDICINE WHOLE BODY BONE SCAN TECHNIQUE: Whole body anterior and posterior images were obtained approximately 3 hours after intravenous injection of radiopharmaceutical. RADIOPHARMACEUTICALS:  20.2 mCi Technetium-69m MDP IV COMPARISON:  CT on 01/14/2017 FINDINGS: Radiopharmaceutical uptake is seen in the L2 vertebral body which has a linear configuration as consistent with L2 vertebral body compression fracture seen on CT also obtained today. Foci of increased radiopharmaceutical uptake are also  seen in 2 adjacent left anterior upper ribs, most consistent with posttraumatic in etiology. Right hip prosthesis noted with mild peri-prostatic uptake. Degenerative uptake also noted in bilateral AC joints, knees, and ankles. No other abnormal sites of osseous uptake are seen to suggest the presence of metastatic disease. IMPRESSION: No evidence of osseous metastatic disease. Electronically Signed   By: Earle Gell M.D.   On: 01/14/2017 16:23    Assessment and Plan:   66 year old gentleman with prostate cancer diagnosed in December 2018. He presented with a PSA of 238 and a Gleason score 4+4 = 8 and 11 out of 12 cores. His clinical staging is T2b.  His case was discussed today in the prostate cancer multidisciplinary clinic. He is pathology specimen was reviewed and discussed with the reviewing pathologist. Imaging studies including CT scan and bone scan discussed with radiology and showed no evidence of metastatic disease.  The natural course of this disease was discussed with  the patient today. He is at relatively high risk of developing metastatic disease not already done so. Although he has no measurable disease at this time, he is at high risk of developing biochemical relapse and subsequently spread of his disease. Aggressive treatment is warranted at this time which includes combined androgen deprivation therapy for at least 2 years with radiation therapy.  Complications associated with androgen deprivation therapy were reviewed today. These would include hot flashes, without fatigue, weight gain, erectile dysfunction among others. I also explained to him that if he develops biochemical relapse and possible systemic metastasis, additional therapy may be warranted. I will be in the form of hormone agents and systemic chemotherapy.  He is agreeable to proceed in the immediate future with androgen deprivation therapy and radiation. All his questions were answered today for a medical oncology  standpoint.

## 2017-02-01 NOTE — Consult Note (Signed)
[facility]     02/01/2017     --------------------------------------------------------------------------------     Ginnie Smart   MRN: [id number]  PRIMARY CARE:     DOB: July 09, 1951, 66 year old Male  REFERRING:  Velna Hatchet, MD   SSN: -[id number]  PROVIDER:  Raynelle Bring, M.D.     LOCATION:  [facility] [phone]     --------------------------------------------------------------------------------     CC/HPI: CC: Prostate Cancer     Location of consult: [facility]   PCP: Dr. Velna Hatchet     Mr. Schierer is a 66 year old gentleman who presented to me in November 2017 with an elevated PSA of 238.8 and a suspicious prostate exam with nodularity of the left side of the prostate. He underwent a TRUS biopsy of the prostate on 12/07/16 that confirmed Gleason 4+4=8 adenocarcinoma of the prostate with 11 out of 12 biopsy cores positive for malignancy. He presents today for further discussion regarding his management and treatment options in the multidisciplinary prostate cancer clinic.     Family history: None.     Imaging studies:   CT scan abdomen and pelvis (01/14/17): Negative for metastatic disease.   Bone scan (01/14/17): Negative for metastatic disease.     PMH: He has no significant known medical comorbidities.   PSH: No abdominal surgeries.     TNM stage: cT2b N M   PSA: 238.8   Gleason score: 4+4=8   Biopsy (12/07/17): 11/12 cores positive     Urinary function: He has minimal LUTS.   Erectile function: He denies erectile dysfunction.        ALLERGIES: No Allergies      MEDICATIONS: No Medications      GU PSH: Locm 300-399Mg /Ml Iodine,1Ml - 01/14/2017  Prostate Needle Biopsy - 12/07/2016       NON-GU PSH: Hip Arthroscopy/surgery, Right  Surgical Pathology, Gross And Microscopic Examination For Prostate Needle - 12/07/2016       GU PMH: Prostate Cancer        NON-GU PMH: No Non-GU PMH      FAMILY HISTORY: 12 daughters - Runs in Family  2 sons - Runs in Family  Diabetes - Father  liver disease - Mother     SOCIAL HISTORY: Marital Status: Married  Current Smoking Status: Patient smokes.   Does drink.   Does not drink caffeine.       REVIEW OF SYSTEMS:     GU Review Male:   Patient denies frequent urination, hard to postpone urination, burning/ pain with urination, get up at night to urinate, leakage of urine, stream starts and stops, trouble starting your streams, and have to strain to urinate .   Gastrointestinal (Lower):   Patient denies diarrhea and constipation.   Gastrointestinal (Upper):   Patient denies nausea and vomiting.   Constitutional:   Patient denies fever, night sweats, weight loss, and fatigue.   Skin:   Patient denies skin rash/ lesion and itching.   Eyes:   Patient denies blurred vision and double vision.   Ears/ Nose/ Throat:   Patient denies sore throat and sinus problems.   Hematologic/Lymphatic:   Patient denies swollen glands and easy bruising.   Cardiovascular:   Patient denies leg swelling and chest pains.   Respiratory:   Patient denies shortness of breath and cough.   Endocrine:   Patient denies excessive thirst.   Musculoskeletal:   Patient denies back pain and  joint pain.   Neurological:   Patient denies headaches and dizziness.   Psychologic:   Patient denies depression and anxiety.     VITAL SIGNS: None     MULTI-SYSTEM PHYSICAL EXAMINATION:     Constitutional: Well-nourished. No physical deformities. Normally developed. Good grooming.        PAST DATA REVIEWED:   Source Of History:  Patient   Lab Test Review:   PSA   Records Review:   Pathology Reports   X-Ray Review: C.T. Abdomen/Pelvis: Reviewed Films.   Bone Scan: Reviewed Films.       01/22/17   PSA   Total PSA 178.00 ng/dl       PROCEDURES: None     ASSESSMENT:       ICD-10 Details   1 GU:   Prostate Cancer - C61      PLAN:              Document  Letter(s):  Created for Patient: Clinical Summary            Notes:   1. Prostate cancer: He has seen Dr. Tammi Klippel and Dr. Alen Blew today in consultation. Dr. Tammi Klippel and Dr. Alen Blew are in agreement that it would be very reasonable for him to proceed with radiation therapy along with long-term androgen deprivation in light of the fact that his staging studies are negative. He is already scheduled for fiducial marker placement on February 20 and we'll plan to begin androgen deprivation that same day. I did review the side effect profile of androgen deprivation.We discussed the risks and benefits of androgen deprivation therapy. Side effects of prolonged use of androgen deprivation were discussed including hot flashes, fracture and depletion of bone mineral density, weight gain, decreased libido, cardiovascular risks such as increased risk of diabetes, abdominal girth, elevated cholesterol, and possibly cardiovascular events such as heart attack or stroke. We discussed intermittent androgen deprivation as an alternative approach to continuous therapy as well as combined androgen blockade. It was explained that intermittent therapy has not been shown to improve survival but in certain populations of men has demonstrated similar survival rates with improved quality of life outcomes. It was also explained that combined androgen blockade may provide a marginal survival benefit as compare with sequential use of anti-androgens but with the risks of additional mild toxicities and increased cost.     He feels comfortable with this approach and will proceed as planned.     Cc: Dr. Zola Button   Dr. Tyler Pita   Dr. Velna Hatchet       E & M CODE: I spent at least 25 minutes face to face with the patient, more than 50% of that time was spent on counseling and/or coordinating care.        * Signed by Raynelle Bring, M.D. on 02/01/17 at 12:38 PM (EST)*

## 2017-02-01 NOTE — Progress Notes (Signed)
                               Care Plan Summary  Name: Jared Rhodes DOB: [birth year]   Your Medical Team:   Urologist -  Dr. Raynelle Bring, [facility]  Radiation Oncologist - Dr. Tyler Pita, [facility]   Medical Oncologist - Dr. Zola Button, [place]  Recommendations: 1) Androgen Deprivation (hormone injections) for 2 years  2) Radiation treatments  * These recommendations are based on information available as of today's consult.      Recommendations may change depending on the results of further tests or exams.  Next Steps: 1) Appointment with Dr. Alinda Money at [facility] for hormone injection and placement of gold markers on 02/12/17.  2) Dr. Tammi Klippel will schedule radiation planning session and radiation treatments   When appointments need to be scheduled, you will be contacted by [facility] and/or [facility].  Questions?  Please do not hesitate to call Jared Rue, RN, BSN, OCN at [phone] any questions or concerns.  Jared Rhodes is your Oncology Nurse Navigator and is available to assist you while you're receiving your medical care at [facility].   Pt given a folder with all team members business cards and a handout about "[facility]".

## 2017-02-01 NOTE — Progress Notes (Signed)
[place] Psychosocial Distress Screening Spiritual Care  Met with Jared Rhodes in [facility] to introduce [place] team/resources, reviewing distress screen per protocol.  The patient scored a 1 on the Psychosocial Distress Thermometer which indicates mild distress. Also assessed for distress and other psychosocial needs.   ONCBCN DISTRESS SCREENING 02/01/2017  Screening Type Initial Screening  Distress experienced in past week (1-10) 1  Referral to support programs Yes   Jared Rhodes describes himself as a matter-of-fact person who "just doesn't sit and think about" potential stressors; instead, he prefers staying busy and taking action.  Provided pastoral presence, introducing [place] team/programming resources, in case need/desire arises.  Follow up needed: No.  Per pt, no other needs/concerns; he plans to reach out if need/desire arises.     Jared Rhodes, [place], [facility] Pager [phone] Voicemail [phone]

## 2017-02-01 NOTE — Progress Notes (Signed)
Radiation Oncology         [phone]) [phone] ________________________________  Initial outpatient Consultation  Name: Jared Rhodes. MRN: [name]  Date: 02/01/2017  DOB: [birth year]  CC:No PCP Per Patient  Jared Bring, MD   REFERRING PHYSICIAN: Raynelle Bring, MD  DIAGNOSIS: 66 year-old gentleman with Stage cT2b N0 M0 adenocarcinoma of the prostate with Gleason Score of 4+4=8, and PSA of 238.    ICD-9-CM ICD-10-CM   1. Prostate cancer ([place]) 185 C61     HISTORY OF PRESENT ILLNESS: Jared Rhodes. is a 66 y.o. male with a diagnosis of prostate cancer. He was noted to have an elevated PSA of 238 by his primary care physician, Dr. Ardeth Rhodes.  Accordingly, he was referred for evaluation in urology by Dr. Alinda Rhodes on Nov. 20, 2017,  digital rectal examination was performed at that time revealing significant firmness of the entire left base and left mid prostate.  The patient proceeded to transrectal ultrasound with 12 biopsies of the prostate on 12/07/16.  The prostate volume measured 67.8 cc.  Out of 12 core biopsies,11 were positive.  The maximum Gleason score was 4+4=8, and this was seen in the entire mid and base of the prostate.  CT A&P was performed on 01/14/17 and was negative for metastatic disease.  Bone scan 01/14/17 demonstrated no evidence of osseous metastatic disease.  The patient reviewed the biopsy results with his urologist and he has kindly been referred today for discussion of potential radiation treatment options.     PREVIOUS RADIATION THERAPY: No  PAST MEDICAL HISTORY:  Past Medical History:  Diagnosis Date  . Hypertension   . Prostate cancer ([place])       PAST SURGICAL HISTORY: Past Surgical History:  Procedure Laterality Date  . I&D EXTREMITY Left 07/17/2014   Procedure: IRRIGATION AND DEBRIDEMENT EXTREMITY;  Surgeon: Marianna Payment, MD;  Location: [facility];  Service: Orthopedics;  Laterality: Left;  . I&D EXTREMITY Left 07/19/2014   Procedure:  IRRIGATION AND DEBRIDEMENT EXTREMITY;  Surgeon: Marianna Payment, MD;  Location: [facility];  Service: Orthopedics;  Laterality: Left;  . ORIF ANKLE FRACTURE Left 07/19/2014   Procedure: OPEN REDUCTION INTERNAL FIXATION (ORIF) ANKLE FRACTURE;  Surgeon: Marianna Payment, MD;  Location: [facility];  Service: Orthopedics;  Laterality: Left;  . PROSTATE BIOPSY    . TONSILLECTOMY    . TOTAL HIP ARTHROPLASTY Right 03/02/2016   Procedure: RIGHT TOTAL HIP ARTHROPLASTY ANTERIOR APPROACH;  Surgeon: Leandrew Koyanagi, MD;  Location: [facility];  Service: Orthopedics;  Laterality: Right;    FAMILY HISTORY:  Family History  Problem Relation Age of Onset  . Alcohol abuse Mother   . Hyperlipidemia Brother   . Heart disease Sister     SOCIAL HISTORY:  Social History   Social History  . Marital status: Legally Separated    Spouse name: n/a  . Number of children: 0  . Years of education: 11th grade   Occupational History  . 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 Smoking status: Current Some Day Smoker    Packs/day: 0.25    Years: 30.00    Types: Cigarettes  . Smokeless tobacco: Never Used  . Alcohol use Yes     Comment: every now and then  . Drug use: No  . Sexual activity: Yes    Partners: Female   Other Topics Concern  . Not on file   Social History Narrative  Lives alone. 2 brothers and 1 sister live in [place].  1 half-sister lives in [place].    ALLERGIES: Penicillins  MEDICATIONS:  Current Outpatient Prescriptions  Medication Sig Dispense Refill  . aspirin EC 325 MG tablet Take 1 tablet (325 mg total) by mouth 2 (two) times daily. 84 tablet 0  . ibuprofen (ADVIL,MOTRIN) 800 MG tablet Take 800 mg by mouth every 8 (eight) hours as needed for mild pain or moderate pain.    . methocarbamol (ROBAXIN) 750 MG tablet Take 1 tablet (750 mg total) by mouth 2 (two) times daily as needed for muscle spasms. 60 tablet 0  . multivitamin-iron-minerals-folic acid  (CENTRUM) chewable tablet Chew 1 tablet by mouth daily.    . ondansetron (ZOFRAN) 4 MG tablet Take 1-2 tablets (4-8 mg total) by mouth every 8 (eight) hours as needed for nausea or vomiting. 40 tablet 0  . oxyCODONE (OXY IR/ROXICODONE) 5 MG immediate release tablet Take 1-3 tablets (5-15 mg total) by mouth every 4 (four) hours as needed. 30 tablet 0  . oxyCODONE (OXYCONTIN) 10 mg 12 hr tablet Take 1 tablet (10 mg total) by mouth every 12 (twelve) hours. 30 tablet 0  . senna-docusate (SENOKOT S) 8.6-50 MG tablet Take 1 tablet by mouth at bedtime as needed. 30 tablet 1   No current facility-administered medications for this encounter.     REVIEW OF SYSTEMS:  On review of systems, the patient reports that he is doing well overall. He denies any chest pain, shortness of breath, cough, fevers, chills, night sweats, unintended weight changes. He reports intentionally losing about 30 lbs at the advice of his physician. He denies any bowel disturbances, and denies abdominal pain, nausea or vomiting. He denies any new musculoskeletal or joint aches or pains. He denies leg swelling. He reports minimal urinary symptoms. He denies erectile dysfunction. A complete review of systems is obtained and is otherwise negative.     PHYSICAL EXAM:  Wt Readings from Last 3 Encounters:  02/01/17 221 lb 9.6 oz (100.5 kg)  03/23/16 246 lb 12.8 oz (111.9 kg)  03/15/16 246 lb (111.6 kg)   Temp Readings from Last 3 Encounters:  02/01/17 98.2 F (36.8 C) (Oral)  03/23/16 98.5 F (36.9 C)  03/15/16 98.6 F (37 C) (Oral)   BP Readings from Last 3 Encounters:  02/01/17 (!) 166/104  03/23/16 132/72  03/15/16 132/76   Pulse Readings from Last 3 Encounters:  02/01/17 71  03/23/16 88  03/15/16 92    In general this is a well appearing African-American male in no acute distress. He is alert and oriented x4 and appropriate throughout the examination. HEENT reveals that the patient is normocephalic, atraumatic. EOMs  are intact. PERRLA. Skin is intact without any evidence of gross lesions. Cardiovascular exam reveals a regular rate and rhythm, no clicks rubs or murmurs are auscultated. Chest is clear to auscultation bilaterally. Lymphatic assessment is performed and does not reveal any adenopathy in the cervical, supraclavicular, axillary, or inguinal chains. Abdomen has active bowel sounds in all quadrants and is intact. The abdomen is soft, non tender, non distended. Lower extremities are negative for pretibial pitting edema, deep calf tenderness, cyanosis or clubbing.    KPS = 100  100 - Normal; no complaints; no evidence of disease. 90   - Able to carry on normal activity; minor signs or symptoms of disease. 80   - Normal activity with effort; some signs or symptoms of disease. 56   - Cares for self; unable  to carry on normal activity or to do active work. 60   - Requires occasional assistance, but is able to care for most of his personal needs. 50   - Requires considerable assistance and frequent medical care. 2   - Disabled; requires special care and assistance. 54   - Severely disabled; hospital admission is indicated although death not imminent. 48   - Very sick; hospital admission necessary; active supportive treatment necessary. 10   - Moribund; fatal processes progressing rapidly. 0     - Dead  Karnofsky DA, Abelmann [place], Craver LS and Burchenal [facility] [phone]) The use of the nitrogen mustards in the palliative treatment of carcinoma: with particular reference to bronchogenic carcinoma Cancer 1 634-56  LABORATORY DATA:  Lab Results  Component Value Date   WBC 7.3 03/03/2016   HGB 10.5 (L) 03/03/2016   HCT 32.2 (L) 03/03/2016   MCV 88.7 03/03/2016   PLT 192 03/03/2016   Lab Results  Component Value Date   NA 138 03/03/2016   K 3.6 03/03/2016   CL 102 03/03/2016   CO2 28 03/03/2016   Lab Results  Component Value Date   ALT 20 02/27/2016   AST 25 02/27/2016   ALKPHOS 37 (L) 02/27/2016    BILITOT 0.6 02/27/2016     RADIOGRAPHY: Nm Bone Scan Whole Body  Result Date: 01/14/2017 CLINICAL DATA:  Newly diagnosed prostate carcinoma. EXAM: NUCLEAR MEDICINE WHOLE BODY BONE SCAN TECHNIQUE: Whole body anterior and posterior images were obtained approximately 3 hours after intravenous injection of radiopharmaceutical. RADIOPHARMACEUTICALS:  20.2 mCi Technetium-81m MDP IV COMPARISON:  CT on 01/14/2017 FINDINGS: Radiopharmaceutical uptake is seen in the L2 vertebral body which has a linear configuration as consistent with L2 vertebral body compression fracture seen on CT also obtained today. Foci of increased radiopharmaceutical uptake are also seen in 2 adjacent left anterior upper ribs, most consistent with posttraumatic in etiology. Right hip prosthesis noted with mild peri-prostatic uptake. Degenerative uptake also noted in bilateral AC joints, knees, and ankles. No other abnormal sites of osseous uptake are seen to suggest the presence of metastatic disease. IMPRESSION: No evidence of osseous metastatic disease. Electronically Signed   By: Earle Gell M.D.   On: 01/14/2017 16:23      IMPRESSION/PLAN: 1. 66 y.o. gentleman with Stage cT2b N0 M0 adenocarcinoma of the prostate with Gleason Score of 4+4=8, and PSA of 238. His T-Stage, Gleason's Score, and PSA put him into the high risk group.  Accordingly he is eligible for a variety of potential treatment options including 2 years of androgen deprivation therapy (LT-ADT) and external radiation treatment.   Today we reviewed the findings and workup thus far.  We discussed the natural history of prostate cancer.  We reviewed the the implications of T-stage, Gleason's Score, and PSA on decision-making and outcomes in prostate cancer.  We discussed radiation treatment in the management of prostate cancer with regard to the logistics and delivery of external beam radiation treatment.  We discussed the fact that he would not be a good candidate for prostate  brachytherapy due to his high risk and high volume disease.  We compared and contrasted external beam radiotherapy against prostatectomy.  The patient expressed interest in external beam radiotherapy.   The patient would like to proceed with prostate IMRT.  We will share our findings with Dr. Alinda Rhodes.  The patient is tentatively scheduled for placement of three gold fiducial markers into the prostate and start of androgen deprivation on 02/12/2017 with Dr. Alinda Rhodes  in preparation to proceed with IMRT in the near future.     We enjoyed meeting with him today, and will look forward to participating in the care of this very nice gentleman.   Freeman Caldron, PA-C   And    Tyler Pita, MD [place] Director and Director of Stereotactic Radiosurgery Direct Dial: [phone]  Fax: [phone] [email or web address]  Skype  LinkedIn   This document serves as a record of services personally performed by Tyler Pita, MD and Freeman Caldron, PA-C. It was created on their behalf by Arlyce Harman, a trained medical scribe. The creation of this record is based on the scribe's personal observations and the provider's statements to them. This document has been checked and approved by the attending provider.

## 2017-02-05 ENCOUNTER — Encounter: Payer: Self-pay | Admitting: *Deleted

## 2017-02-08 ENCOUNTER — Telehealth: Payer: Self-pay | Admitting: Medical Oncology

## 2017-02-08 NOTE — Progress Notes (Signed)
I attempted to call [name] to follow up post Prostate MDC. I was not able to reach patient. I was able to see that he is scheduled for androgen deprivation and gold markers with Dr. Alinda Money 02/12/17.

## 2017-03-29 ENCOUNTER — Telehealth: Payer: Self-pay | Admitting: *Deleted

## 2017-03-29 NOTE — Telephone Encounter (Signed)
Called patient to inform that sim has been moved to 04-04-17 @ 8 am @ Dr. Johny Shears Office, patient unreachable, mailed appt. Card to patient's home

## 2017-04-03 NOTE — Progress Notes (Signed)
  Radiation Oncology         [phone]) [phone] ________________________________  Name: Jared Rhodes. MRN: [id number]  Date: 04/04/2017  DOB: 1951-07-04  SIMULATION AND TREATMENT PLANNING NOTE    ICD-9-CM ICD-10-CM   1. Prostate cancer ([place]) 185 C61     DIAGNOSIS:  66 year-old gentleman with Stage cT2b N0 M0 adenocarcinoma of the prostate with Gleason Score of 4+4=8, and PSA of 238.  NARRATIVE:  The patient was brought to the [facility].  Identity was confirmed.  All relevant records and images related to the planned course of therapy were reviewed.  The patient freely provided informed written consent to proceed with treatment after reviewing the details related to the planned course of therapy. The consent form was witnessed and verified by the simulation staff.  Then, the patient was set-up in a stable reproducible supine position for radiation therapy.  A vacuum lock pillow device was custom fabricated to position his legs in a reproducible immobilized position.  Then, I performed a urethrogram under sterile conditions to identify the prostatic apex.  CT images were obtained.  Surface markings were placed.  The CT images were loaded into the planning software.  Then the prostate target and avoidance structures including the rectum, bladder, bowel and hips were contoured.  Treatment planning then occurred.  The radiation prescription was entered and confirmed.  A total of 1 complex treatment devices were fabricated. I have requested : Intensity Modulated Radiotherapy (IMRT) is medically necessary for this case for the following reason:  Rectal sparing.Marland Kitchen  PLAN:  The patient will receive 75 Gy in 40 fractions with 45 Gy to the prostate, SVs and pelvic lymph nodes followed by boost.  ________________________________  Sheral Apley. Tammi Klippel, M.D.  This document serves as a record of services personally performed by Tyler Pita, MD. It was created on his behalf by Arlyce Harman, a trained medical scribe. The creation of this record is based on the scribe's personal observations and the provider's statements to them. This document has been checked and approved by the attending provider.

## 2017-04-04 ENCOUNTER — Ambulatory Visit
Admission: RE | Admit: 2017-04-04 | Discharge: 2017-04-04 | Disposition: A | Discharge status: Home / Self Care | Payer: Medicare Other | Source: Ambulatory Visit | Attending: Radiation Oncology | Admitting: Radiation Oncology

## 2017-04-04 DIAGNOSIS — C61 Malignant neoplasm of prostate: Secondary | ICD-10-CM | POA: Insufficient documentation

## 2017-04-04 DIAGNOSIS — Z510 Encounter for antineoplastic radiation therapy: Secondary | ICD-10-CM | POA: Insufficient documentation

## 2017-04-07 NOTE — Addendum Note (Signed)
Encounter addended by: Tyler Pita, MD on: 04/07/2017  4:01 PM<BR>    Actions taken: Problem List reviewed

## 2017-04-11 ENCOUNTER — Ambulatory Visit: Payer: Medicare Other | Admitting: Radiation Oncology

## 2017-04-11 DIAGNOSIS — Z510 Encounter for antineoplastic radiation therapy: Secondary | ICD-10-CM | POA: Diagnosis not present

## 2017-04-15 ENCOUNTER — Ambulatory Visit: Payer: Medicare Other | Admitting: Radiation Oncology

## 2017-04-16 ENCOUNTER — Ambulatory Visit: Payer: Medicare Other

## 2017-04-17 ENCOUNTER — Ambulatory Visit
Admission: RE | Admit: 2017-04-17 | Discharge: 2017-04-17 | Disposition: A | Discharge status: Home / Self Care | Payer: Medicare Other | Source: Ambulatory Visit | Attending: Radiation Oncology | Admitting: Radiation Oncology

## 2017-04-17 DIAGNOSIS — Z510 Encounter for antineoplastic radiation therapy: Secondary | ICD-10-CM | POA: Diagnosis not present

## 2017-04-18 ENCOUNTER — Ambulatory Visit
Admission: RE | Admit: 2017-04-18 | Discharge: 2017-04-18 | Disposition: A | Discharge status: Home / Self Care | Payer: Medicare Other | Source: Ambulatory Visit | Attending: Radiation Oncology | Admitting: Radiation Oncology

## 2017-04-18 DIAGNOSIS — Z510 Encounter for antineoplastic radiation therapy: Secondary | ICD-10-CM | POA: Diagnosis not present

## 2017-04-19 ENCOUNTER — Ambulatory Visit
Admission: RE | Admit: 2017-04-19 | Discharge: 2017-04-19 | Disposition: A | Discharge status: Home / Self Care | Payer: Medicare Other | Source: Ambulatory Visit | Attending: Radiation Oncology | Admitting: Radiation Oncology

## 2017-04-19 ENCOUNTER — Telehealth: Payer: Self-pay | Admitting: *Deleted

## 2017-04-19 DIAGNOSIS — Z510 Encounter for antineoplastic radiation therapy: Secondary | ICD-10-CM | POA: Diagnosis not present

## 2017-04-19 NOTE — Telephone Encounter (Signed)
Spoke with Mrs. Gloriann Loan.  Patient just called her requesting transportation for 11am appointment.  Confirmed that patient does have any appt. For 11am.    Mrs. Bell stated that patient states he does not get his appointments in advance - only one day notice.  They ask for 3 business days in advance in order to secure transportation.  Let her know that patient does have his appointments for his daily radiation into the month of June.  She will ask him about this in order to make sure they are able to provide transportation.

## 2017-04-22 ENCOUNTER — Ambulatory Visit
Admission: RE | Admit: 2017-04-22 | Discharge: 2017-04-22 | Disposition: A | Discharge status: Home / Self Care | Payer: Medicare Other | Source: Ambulatory Visit | Attending: Radiation Oncology | Admitting: Radiation Oncology

## 2017-04-22 DIAGNOSIS — Z510 Encounter for antineoplastic radiation therapy: Secondary | ICD-10-CM | POA: Diagnosis not present

## 2017-04-23 ENCOUNTER — Ambulatory Visit: Payer: Medicare Other

## 2017-04-24 ENCOUNTER — Ambulatory Visit: Payer: Medicare Other

## 2017-04-25 ENCOUNTER — Ambulatory Visit
Admission: RE | Admit: 2017-04-25 | Discharge: 2017-04-25 | Disposition: A | Discharge status: Home / Self Care | Payer: Medicare Other | Source: Ambulatory Visit | Attending: Radiation Oncology | Admitting: Radiation Oncology

## 2017-04-25 DIAGNOSIS — Z510 Encounter for antineoplastic radiation therapy: Secondary | ICD-10-CM | POA: Diagnosis not present

## 2017-04-26 ENCOUNTER — Ambulatory Visit
Admission: RE | Admit: 2017-04-26 | Discharge: 2017-04-26 | Disposition: A | Discharge status: Home / Self Care | Payer: Medicare Other | Source: Ambulatory Visit | Attending: Radiation Oncology | Admitting: Radiation Oncology

## 2017-04-26 DIAGNOSIS — Z510 Encounter for antineoplastic radiation therapy: Secondary | ICD-10-CM | POA: Diagnosis not present

## 2017-04-29 ENCOUNTER — Ambulatory Visit
Admission: RE | Admit: 2017-04-29 | Discharge: 2017-04-29 | Disposition: A | Discharge status: Home / Self Care | Payer: Medicare Other | Source: Ambulatory Visit | Attending: Radiation Oncology | Admitting: Radiation Oncology

## 2017-04-29 DIAGNOSIS — Z510 Encounter for antineoplastic radiation therapy: Secondary | ICD-10-CM | POA: Diagnosis not present

## 2017-04-29 NOTE — Telephone Encounter (Signed)
Jan. Thank you for the update. I called Mrs Gloriann Loan today for further explanation. I have requested the therapist that see the patient daily for treatment remind him to call Ir[facility]wn[facility] [phone] every other week to schedule his upcoming treatment appointments for 1030 through June. Mrs. Bell reports this is the patient's responsibility and won't allow me to do it for him.

## 2017-04-30 ENCOUNTER — Ambulatory Visit
Admission: RE | Admit: 2017-04-30 | Discharge: 2017-04-30 | Disposition: A | Discharge status: Home / Self Care | Payer: Medicare Other | Source: Ambulatory Visit | Attending: Radiation Oncology | Admitting: Radiation Oncology

## 2017-04-30 DIAGNOSIS — Z510 Encounter for antineoplastic radiation therapy: Secondary | ICD-10-CM | POA: Diagnosis not present

## 2017-05-01 ENCOUNTER — Ambulatory Visit
Admission: RE | Admit: 2017-05-01 | Discharge: 2017-05-01 | Disposition: A | Discharge status: Home / Self Care | Payer: Medicare Other | Source: Ambulatory Visit | Attending: Radiation Oncology | Admitting: Radiation Oncology

## 2017-05-01 DIAGNOSIS — Z510 Encounter for antineoplastic radiation therapy: Secondary | ICD-10-CM | POA: Diagnosis not present

## 2017-05-02 ENCOUNTER — Encounter: Payer: Self-pay | Admitting: Medical Oncology

## 2017-05-02 ENCOUNTER — Ambulatory Visit
Admission: RE | Admit: 2017-05-02 | Discharge: 2017-05-02 | Disposition: A | Discharge status: Home / Self Care | Payer: Medicare Other | Source: Ambulatory Visit | Attending: Radiation Oncology | Admitting: Radiation Oncology

## 2017-05-02 DIAGNOSIS — Z510 Encounter for antineoplastic radiation therapy: Secondary | ICD-10-CM | POA: Diagnosis not present

## 2017-05-02 NOTE — Progress Notes (Signed)
Jared Rhodes states he is doing ok with his treatments.There has been some confusion with the patient and his transportation. He has a calendar of his radiation treatments and he is reminded  to call [facility] to schedule these in advance. Will continue to follow.

## 2017-05-03 ENCOUNTER — Ambulatory Visit
Admission: RE | Admit: 2017-05-03 | Discharge: 2017-05-03 | Disposition: A | Discharge status: Home / Self Care | Payer: Medicare Other | Source: Ambulatory Visit | Attending: Radiation Oncology | Admitting: Radiation Oncology

## 2017-05-03 DIAGNOSIS — Z510 Encounter for antineoplastic radiation therapy: Secondary | ICD-10-CM | POA: Diagnosis not present

## 2017-05-06 ENCOUNTER — Ambulatory Visit: Payer: Medicare Other

## 2017-05-07 ENCOUNTER — Encounter: Payer: Self-pay | Admitting: Medical Oncology

## 2017-05-07 ENCOUNTER — Ambulatory Visit
Admission: RE | Admit: 2017-05-07 | Discharge: 2017-05-07 | Disposition: A | Discharge status: Home / Self Care | Payer: Medicare Other | Source: Ambulatory Visit | Attending: Radiation Oncology | Admitting: Radiation Oncology

## 2017-05-07 DIAGNOSIS — Z510 Encounter for antineoplastic radiation therapy: Secondary | ICD-10-CM | POA: Diagnosis not present

## 2017-05-07 NOTE — Progress Notes (Signed)
[name] states he is doing well with radiation. He was not here yesterday due to being out of town. Will continue to follow.

## 2017-05-08 ENCOUNTER — Ambulatory Visit
Admission: RE | Admit: 2017-05-08 | Discharge: 2017-05-08 | Disposition: A | Discharge status: Home / Self Care | Payer: Medicare Other | Source: Ambulatory Visit | Attending: Radiation Oncology | Admitting: Radiation Oncology

## 2017-05-08 DIAGNOSIS — Z510 Encounter for antineoplastic radiation therapy: Secondary | ICD-10-CM | POA: Diagnosis not present

## 2017-05-09 ENCOUNTER — Ambulatory Visit
Admission: RE | Admit: 2017-05-09 | Discharge: 2017-05-09 | Disposition: A | Discharge status: Home / Self Care | Payer: Medicare Other | Source: Ambulatory Visit | Attending: Radiation Oncology | Admitting: Radiation Oncology

## 2017-05-09 DIAGNOSIS — Z510 Encounter for antineoplastic radiation therapy: Secondary | ICD-10-CM | POA: Diagnosis not present

## 2017-05-10 ENCOUNTER — Ambulatory Visit
Admission: RE | Admit: 2017-05-10 | Discharge: 2017-05-10 | Disposition: A | Discharge status: Home / Self Care | Payer: Medicare Other | Source: Ambulatory Visit | Attending: Radiation Oncology | Admitting: Radiation Oncology

## 2017-05-10 DIAGNOSIS — Z510 Encounter for antineoplastic radiation therapy: Secondary | ICD-10-CM | POA: Diagnosis not present

## 2017-05-13 ENCOUNTER — Ambulatory Visit
Admission: RE | Admit: 2017-05-13 | Discharge: 2017-05-13 | Disposition: A | Discharge status: Home / Self Care | Payer: Medicare Other | Source: Ambulatory Visit | Attending: Radiation Oncology | Admitting: Radiation Oncology

## 2017-05-13 DIAGNOSIS — Z510 Encounter for antineoplastic radiation therapy: Secondary | ICD-10-CM | POA: Diagnosis not present

## 2017-05-14 ENCOUNTER — Ambulatory Visit
Admission: RE | Admit: 2017-05-14 | Discharge: 2017-05-14 | Disposition: A | Discharge status: Home / Self Care | Payer: Medicare Other | Source: Ambulatory Visit | Attending: Radiation Oncology | Admitting: Radiation Oncology

## 2017-05-14 DIAGNOSIS — Z510 Encounter for antineoplastic radiation therapy: Secondary | ICD-10-CM | POA: Diagnosis not present

## 2017-05-15 ENCOUNTER — Ambulatory Visit
Admission: RE | Admit: 2017-05-15 | Discharge: 2017-05-15 | Disposition: A | Discharge status: Home / Self Care | Payer: Medicare Other | Source: Ambulatory Visit | Attending: Radiation Oncology | Admitting: Radiation Oncology

## 2017-05-15 DIAGNOSIS — Z510 Encounter for antineoplastic radiation therapy: Secondary | ICD-10-CM | POA: Diagnosis not present

## 2017-05-16 ENCOUNTER — Ambulatory Visit
Admission: RE | Admit: 2017-05-16 | Discharge: 2017-05-16 | Disposition: A | Discharge status: Home / Self Care | Payer: Medicare Other | Source: Ambulatory Visit | Attending: Radiation Oncology | Admitting: Radiation Oncology

## 2017-05-16 DIAGNOSIS — Z510 Encounter for antineoplastic radiation therapy: Secondary | ICD-10-CM | POA: Diagnosis not present

## 2017-05-17 ENCOUNTER — Ambulatory Visit
Admission: RE | Admit: 2017-05-17 | Discharge: 2017-05-17 | Disposition: A | Discharge status: Home / Self Care | Payer: Medicare Other | Source: Ambulatory Visit | Attending: Radiation Oncology | Admitting: Radiation Oncology

## 2017-05-17 ENCOUNTER — Encounter: Payer: Self-pay | Admitting: Medical Oncology

## 2017-05-17 DIAGNOSIS — Z510 Encounter for antineoplastic radiation therapy: Secondary | ICD-10-CM | POA: Diagnosis not present

## 2017-05-20 ENCOUNTER — Ambulatory Visit: Payer: Medicare Other

## 2017-05-21 ENCOUNTER — Ambulatory Visit: Payer: Medicare Other

## 2017-05-22 ENCOUNTER — Ambulatory Visit: Payer: Medicare Other

## 2017-05-22 ENCOUNTER — Ambulatory Visit
Admission: RE | Admit: 2017-05-22 | Discharge: 2017-05-22 | Disposition: A | Discharge status: Home / Self Care | Payer: Medicare Other | Source: Ambulatory Visit | Attending: Radiation Oncology | Admitting: Radiation Oncology

## 2017-05-22 DIAGNOSIS — Z510 Encounter for antineoplastic radiation therapy: Secondary | ICD-10-CM | POA: Diagnosis not present

## 2017-05-23 ENCOUNTER — Ambulatory Visit: Payer: Medicare Other

## 2017-05-23 ENCOUNTER — Ambulatory Visit
Admission: RE | Admit: 2017-05-23 | Discharge: 2017-05-23 | Disposition: A | Discharge status: Home / Self Care | Payer: Medicare Other | Source: Ambulatory Visit | Attending: Radiation Oncology | Admitting: Radiation Oncology

## 2017-05-23 DIAGNOSIS — Z510 Encounter for antineoplastic radiation therapy: Secondary | ICD-10-CM | POA: Diagnosis not present

## 2017-05-24 ENCOUNTER — Ambulatory Visit: Payer: Medicare Other

## 2017-05-24 ENCOUNTER — Ambulatory Visit
Admission: RE | Admit: 2017-05-24 | Discharge: 2017-05-24 | Disposition: A | Discharge status: Home / Self Care | Payer: Medicare Other | Source: Ambulatory Visit | Attending: Radiation Oncology | Admitting: Radiation Oncology

## 2017-05-24 DIAGNOSIS — Z510 Encounter for antineoplastic radiation therapy: Secondary | ICD-10-CM | POA: Diagnosis not present

## 2017-05-26 ENCOUNTER — Ambulatory Visit: Payer: Medicare Other

## 2017-05-27 ENCOUNTER — Ambulatory Visit: Payer: Medicare Other

## 2017-05-28 ENCOUNTER — Ambulatory Visit: Payer: Medicare Other

## 2017-05-29 ENCOUNTER — Ambulatory Visit: Payer: Medicare Other

## 2017-05-30 ENCOUNTER — Ambulatory Visit: Payer: Medicare Other

## 2017-05-31 ENCOUNTER — Ambulatory Visit: Payer: Medicare Other

## 2017-06-03 ENCOUNTER — Ambulatory Visit: Payer: Medicare Other

## 2017-06-04 ENCOUNTER — Encounter (HOSPITAL_COMMUNITY): Payer: Self-pay | Admitting: Emergency Medicine

## 2017-06-04 ENCOUNTER — Ambulatory Visit: Payer: Medicare Other

## 2017-06-04 ENCOUNTER — Emergency Department (HOSPITAL_COMMUNITY)
Admission: EM | Admit: 2017-06-04 | Discharge: 2017-06-04 | Disposition: A | Discharge status: Home / Self Care | Payer: Medicare Other | Attending: Emergency Medicine | Admitting: Emergency Medicine

## 2017-06-04 ENCOUNTER — Ambulatory Visit
Admission: RE | Admit: 2017-06-04 | Discharge: 2017-06-04 | Disposition: A | Discharge status: Home / Self Care | Payer: Medicare Other | Source: Ambulatory Visit | Attending: Radiation Oncology | Admitting: Radiation Oncology

## 2017-06-04 DIAGNOSIS — F17210 Nicotine dependence, cigarettes, uncomplicated: Secondary | ICD-10-CM | POA: Insufficient documentation

## 2017-06-04 DIAGNOSIS — Z510 Encounter for antineoplastic radiation therapy: Secondary | ICD-10-CM | POA: Diagnosis not present

## 2017-06-04 DIAGNOSIS — Z7982 Long term (current) use of aspirin: Secondary | ICD-10-CM | POA: Insufficient documentation

## 2017-06-04 DIAGNOSIS — I10 Essential (primary) hypertension: Secondary | ICD-10-CM | POA: Diagnosis not present

## 2017-06-04 DIAGNOSIS — Z8546 Personal history of malignant neoplasm of prostate: Secondary | ICD-10-CM | POA: Diagnosis not present

## 2017-06-04 DIAGNOSIS — L299 Pruritus, unspecified: Secondary | ICD-10-CM | POA: Diagnosis present

## 2017-06-04 DIAGNOSIS — Z96641 Presence of right artificial hip joint: Secondary | ICD-10-CM | POA: Diagnosis not present

## 2017-06-04 DIAGNOSIS — Z79899 Other long term (current) drug therapy: Secondary | ICD-10-CM | POA: Insufficient documentation

## 2017-06-04 MED ORDER — HYDROXYZINE HCL 25 MG PO TABS: 25.0000 mg | ORAL_TABLET | Freq: Three times a day (TID) | ORAL | 0 refills | Status: DC | PRN

## 2017-06-04 MED ORDER — HYDROXYZINE HCL 25 MG PO TABS
25.0000 mg | ORAL_TABLET | Freq: Once | ORAL | Status: AC
  Administered 2017-06-04: 25 mg via ORAL
  Filled 2017-06-04: qty 1

## 2017-06-04 MED ORDER — PERMETHRIN 5 % EX CREA: TOPICAL_CREAM | CUTANEOUS | 1 refills | Status: DC

## 2017-06-04 NOTE — ED Triage Notes (Signed)
Pt c/o itching on torso and extremities x 4. Pt has not taken any OTC or ointments for itching. Pt denies exposure to poison or insect / spider bites. Pt states he has been receiving radiation therapy 5d/week for prostate cancer.

## 2017-06-04 NOTE — ED Provider Notes (Signed)
[facility] DEPT Provider Note   CSN: [id number] Arrival date & time: 06/04/17  1049     History   Chief Complaint Chief Complaint  Patient presents with  . Pruritis    HPI Jared Rhodes. is a 66 y.o. male.  The history is provided by the patient and medical records. No language interpreter was used.   Jared Rhodes. is a 66 y.o. male  with a PMH of HTN and prostate cancer undergoing radiation treatment who presents to the Emergency Department complaining of generalized itching for the last three days. He states that last night, itching became much worse, prompting him to come to ER. He has tried no medications or treatments prior to arrival for symptoms. No other contacts with similar. No change in detergents, lotions, soaps, etc. No known bites. He has been undergoing radiation therapy since April and never had itching sensation in the past. No fever or chills. He has not seen any rashes or lesions on skin. No oral swelling, cough, congestion, shortness of breath.   Past Medical History:  Diagnosis Date  . Hypertension   . Prostate cancer [facility])     Patient Active Problem List   Diagnosis Date Noted  . Prostate cancer ([place]) 02/01/2017  . Osteoarthritis of right hip 03/02/2016  . Hip joint replacement status 03/02/2016  . Alcohol intoxication ([place]) 07/17/2014  . Type III open fracture dislocation of left ankle joint 07/17/2014  . HTN (hypertension) 04/17/2014  . Severe obesity (BMI >= 40) ([place]) 04/17/2014    Past Surgical History:  Procedure Laterality Date  . I&D EXTREMITY Left 07/17/2014   Procedure: IRRIGATION AND DEBRIDEMENT EXTREMITY;  Surgeon: Marianna Payment, MD;  Location: [facility];  Service: Orthopedics;  Laterality: Left;  . I&D EXTREMITY Left 07/19/2014   Procedure: IRRIGATION AND DEBRIDEMENT EXTREMITY;  Surgeon: Marianna Payment, MD;  Location: [facility];  Service: Orthopedics;  Laterality: Left;  . ORIF ANKLE FRACTURE Left 07/19/2014   Procedure:  OPEN REDUCTION INTERNAL FIXATION (ORIF) ANKLE FRACTURE;  Surgeon: Marianna Payment, MD;  Location: [facility];  Service: Orthopedics;  Laterality: Left;  . PROSTATE BIOPSY    . TONSILLECTOMY    . TOTAL HIP ARTHROPLASTY Right 03/02/2016   Procedure: RIGHT TOTAL HIP ARTHROPLASTY ANTERIOR APPROACH;  Surgeon: Leandrew Koyanagi, MD;  Location: [facility];  Service: Orthopedics;  Laterality: Right;       Home Medications    Prior to Admission medications   Medication Sig Start Date End Date Taking? Authorizing Provider  aspirin EC 325 MG tablet Take 1 tablet (325 mg total) by mouth 2 (two) times daily. 03/02/16   Leandrew Koyanagi, MD  hydrOXYzine (ATARAX/VISTARIL) 25 MG tablet Take 1 tablet (25 mg total) by mouth every 8 (eight) hours as needed for itching. 06/04/17   Jahkari Maclin, Ozella Almond, PA-C  ibuprofen (ADVIL,MOTRIN) 800 MG tablet Take 800 mg by mouth every 8 (eight) hours as needed for mild pain or moderate pain.    [provider]  methocarbamol (ROBAXIN) 750 MG tablet Take 1 tablet (750 mg total) by mouth 2 (two) times daily as needed for muscle spasms. 03/02/16   Leandrew Koyanagi, MD  multivitamin-iron-minerals-folic acid (CENTRUM) chewable tablet Chew 1 tablet by mouth daily.    [provider]  ondansetron (ZOFRAN) 4 MG tablet Take 1-2 tablets (4-8 mg total) by mouth every 8 (eight) hours as needed for nausea or vomiting. 03/02/16   Leandrew Koyanagi, MD  oxyCODONE (OXY IR/ROXICODONE) 5 MG immediate  release tablet Take 1-3 tablets (5-15 mg total) by mouth every 4 (four) hours as needed. 03/23/16   Gerlene Fee, NP  oxyCODONE (OXYCONTIN) 10 mg 12 hr tablet Take 1 tablet (10 mg total) by mouth every 12 (twelve) hours. 03/23/16   Gerlene Fee, NP  permethrin (ELIMITE) 5 % cream Apply to body from neck down once at night. Sleep in medication and rinse off the next morning. Repeat in 1 week if symptoms persist. 06/04/17   Analeia Ismael, Ozella Almond, PA-C  senna-docusate (SENOKOT S) 8.6-50 MG tablet Take 1  tablet by mouth at bedtime as needed. 03/02/16   Leandrew Koyanagi, MD    Family History Family History  Problem Relation Age of Onset  . Alcohol abuse Mother   . Hyperlipidemia Brother   . Heart disease Sister     Social History Social History  Substance Use Topics  . Smoking status: Current Some Day Smoker    Packs/day: 0.25    Years: 30.00    Types: Cigarettes  . Smokeless tobacco: Never Used  . Alcohol use Yes     Comment: every now and then     Allergies   Penicillins   Review of Systems Review of Systems  Skin: Positive for rash.  All other systems reviewed and are negative.    Physical Exam Updated Vital Signs BP 126/76 (BP Location: Right Arm)   Pulse (!) 103   Temp 98.3 F (36.8 C) (Oral)   Resp 17   Ht 5\' 9"  (1.753 m)   Wt 92.5 kg (204 lb)   SpO2 99%   BMI 30.13 kg/m   Physical Exam  Constitutional: He is oriented to person, place, and time. He appears well-developed and well-nourished. No distress.  HENT:  Head: Normocephalic and atraumatic.  Cardiovascular: Normal rate, regular rhythm and normal heart sounds.   No murmur heard. Pulmonary/Chest: Effort normal and breath sounds normal. No respiratory distress.  Abdominal: Soft. He exhibits no distension. There is no tenderness.  Musculoskeletal: He exhibits no edema.  Neurological: He is alert and oriented to person, place, and time.  Skin: Skin is warm and dry.  Erythematous papules to the right forearm and chest. Excoriations markings to bilateral upper and lower extremities.  Nursing note and vitals reviewed.    ED Treatments / Results  Labs (all labs ordered are listed, but only abnormal results are displayed) Labs Reviewed - No data to display  EKG  EKG Interpretation None       Radiology No results found.  Procedures Procedures (including critical care time)  Medications Ordered in ED Medications  hydrOXYzine (ATARAX/VISTARIL) tablet 25 mg (25 mg Oral Given 06/04/17 1159)       Initial Impression / Assessment and Plan / ED Course  I have reviewed the triage vital signs and the nursing notes.  Pertinent labs & imaging results that were available during my care of the patient were reviewed by me and considered in my medical decision making (see chart for details).    Axton Cihlar. is a 66 y.o. male who presents to ED for pruritic rash. No fevers or other systemic symptoms. No signs of superimposed infection. No induration to suggest abscess. Possibly due to scabies. Advised on permethrin use and proper cleaning of household, clothing and bedding for treatment of infestation. Discharged with Rx for permethrin and atarax prn itching. Return precautions discussed for development of overlying cellulitis, impetigo, or other concerning symptoms.  Patient seen by and discussed with Dr.  Steinl who agrees with treatment plan.    Final Clinical Impressions(s) / ED Diagnoses   Final diagnoses:  Pruritus    New Prescriptions New Prescriptions   HYDROXYZINE (ATARAX/VISTARIL) 25 MG TABLET    Take 1 tablet (25 mg total) by mouth every 8 (eight) hours as needed for itching.   PERMETHRIN (ELIMITE) 5 % CREAM    Apply to body from neck down once at night. Sleep in medication and rinse off the next morning. Repeat in 1 week if symptoms persist.       Alvon Nygaard, Ozella Almond, PA-C 06/04/17 1201    Lajean Saver, MD 06/04/17 [phone]

## 2017-06-04 NOTE — Discharge Instructions (Signed)
It was my pleasure taking care of you today!  Atarax as needed for itching. Apply Permethrin as directed. Be sure the cream is on the skin for at least 8 hours.  Most people sleep in it over night & wash it off in the morning. I have given you a refill so that you can repeat treatment in 1 week if there is no improvement. You may also use camomile lotion if needed - try not to scratch!  Wash all clothing & linens in hot water. Please follow-up with your primary doctor in 3-5 days for discussion of your diagnoses and further evaluation after today's visit. If you do not have one, please see the information below for help. Please return to the ER for new or worsening symptoms, signs of infection or any additional concerns.   To find a primary care or specialty doctor please call [phone] or [phone] to access "[facility]."  You may also go on the [facility] website at [email or web address]  There are also multiple [facility] and [facility] practices throughout the [place] that are frequently accepting new patients. You may find a clinic that is close to your home and contact them.  [facility] - [place] [phone]  [facility] in [place] (also locations in [place] and [place]) - [place] [phone]  [place] [phone]

## 2017-06-05 ENCOUNTER — Ambulatory Visit
Admission: RE | Admit: 2017-06-05 | Discharge: 2017-06-05 | Disposition: A | Discharge status: Home / Self Care | Payer: Medicare Other | Source: Ambulatory Visit | Attending: Radiation Oncology | Admitting: Radiation Oncology

## 2017-06-05 ENCOUNTER — Ambulatory Visit: Payer: Medicare Other

## 2017-06-05 DIAGNOSIS — Z510 Encounter for antineoplastic radiation therapy: Secondary | ICD-10-CM | POA: Diagnosis not present

## 2017-06-06 ENCOUNTER — Ambulatory Visit: Payer: Medicare Other

## 2017-06-06 ENCOUNTER — Ambulatory Visit
Admission: RE | Admit: 2017-06-06 | Discharge: 2017-06-06 | Disposition: A | Discharge status: Home / Self Care | Payer: Medicare Other | Source: Ambulatory Visit | Attending: Radiation Oncology | Admitting: Radiation Oncology

## 2017-06-06 DIAGNOSIS — Z510 Encounter for antineoplastic radiation therapy: Secondary | ICD-10-CM | POA: Diagnosis not present

## 2017-06-07 ENCOUNTER — Ambulatory Visit
Admission: RE | Admit: 2017-06-07 | Discharge: 2017-06-07 | Disposition: A | Discharge status: Home / Self Care | Payer: Medicare Other | Source: Ambulatory Visit | Attending: Radiation Oncology | Admitting: Radiation Oncology

## 2017-06-07 ENCOUNTER — Ambulatory Visit: Payer: Medicare Other

## 2017-06-07 DIAGNOSIS — Z510 Encounter for antineoplastic radiation therapy: Secondary | ICD-10-CM | POA: Diagnosis not present

## 2017-06-10 ENCOUNTER — Ambulatory Visit
Admission: RE | Admit: 2017-06-10 | Discharge: 2017-06-10 | Disposition: A | Discharge status: Home / Self Care | Payer: Medicare Other | Source: Ambulatory Visit | Attending: Radiation Oncology | Admitting: Radiation Oncology

## 2017-06-10 ENCOUNTER — Ambulatory Visit: Payer: Medicare Other

## 2017-06-10 ENCOUNTER — Encounter: Payer: Self-pay | Admitting: Medical Oncology

## 2017-06-10 DIAGNOSIS — Z510 Encounter for antineoplastic radiation therapy: Secondary | ICD-10-CM | POA: Diagnosis not present

## 2017-06-11 ENCOUNTER — Ambulatory Visit: Payer: Medicare Other

## 2017-06-12 ENCOUNTER — Ambulatory Visit: Payer: Medicare Other

## 2017-06-12 ENCOUNTER — Ambulatory Visit
Admission: RE | Admit: 2017-06-12 | Discharge: 2017-06-12 | Disposition: A | Discharge status: Home / Self Care | Payer: Medicare Other | Source: Ambulatory Visit | Attending: Radiation Oncology | Admitting: Radiation Oncology

## 2017-06-12 DIAGNOSIS — Z510 Encounter for antineoplastic radiation therapy: Secondary | ICD-10-CM | POA: Diagnosis not present

## 2017-06-13 ENCOUNTER — Ambulatory Visit
Admission: RE | Admit: 2017-06-13 | Discharge: 2017-06-13 | Disposition: A | Discharge status: Home / Self Care | Payer: Medicare Other | Source: Ambulatory Visit | Attending: Radiation Oncology | Admitting: Radiation Oncology

## 2017-06-13 ENCOUNTER — Ambulatory Visit: Payer: Medicare Other

## 2017-06-13 DIAGNOSIS — Z510 Encounter for antineoplastic radiation therapy: Secondary | ICD-10-CM | POA: Diagnosis not present

## 2017-06-14 ENCOUNTER — Ambulatory Visit: Payer: Medicare Other

## 2017-06-14 ENCOUNTER — Ambulatory Visit
Admission: RE | Admit: 2017-06-14 | Discharge: 2017-06-14 | Disposition: A | Discharge status: Home / Self Care | Payer: Medicare Other | Source: Ambulatory Visit | Attending: Radiation Oncology | Admitting: Radiation Oncology

## 2017-06-14 DIAGNOSIS — Z510 Encounter for antineoplastic radiation therapy: Secondary | ICD-10-CM | POA: Diagnosis not present

## 2017-06-17 ENCOUNTER — Ambulatory Visit: Payer: Medicare Other

## 2017-06-17 ENCOUNTER — Ambulatory Visit
Admission: RE | Admit: 2017-06-17 | Discharge: 2017-06-17 | Disposition: A | Discharge status: Home / Self Care | Payer: Medicare Other | Source: Ambulatory Visit | Attending: Radiation Oncology | Admitting: Radiation Oncology

## 2017-06-17 DIAGNOSIS — Z510 Encounter for antineoplastic radiation therapy: Secondary | ICD-10-CM | POA: Diagnosis not present

## 2017-06-18 ENCOUNTER — Ambulatory Visit
Admission: RE | Admit: 2017-06-18 | Discharge: 2017-06-18 | Disposition: A | Discharge status: Home / Self Care | Payer: Medicare Other | Source: Ambulatory Visit | Attending: Radiation Oncology | Admitting: Radiation Oncology

## 2017-06-18 ENCOUNTER — Ambulatory Visit: Payer: Medicare Other

## 2017-06-18 DIAGNOSIS — Z510 Encounter for antineoplastic radiation therapy: Secondary | ICD-10-CM | POA: Diagnosis not present

## 2017-06-19 ENCOUNTER — Ambulatory Visit: Payer: Medicare Other

## 2017-06-19 ENCOUNTER — Ambulatory Visit
Admission: RE | Admit: 2017-06-19 | Discharge: 2017-06-19 | Disposition: A | Discharge status: Home / Self Care | Payer: Medicare Other | Source: Ambulatory Visit | Attending: Radiation Oncology | Admitting: Radiation Oncology

## 2017-06-19 DIAGNOSIS — Z510 Encounter for antineoplastic radiation therapy: Secondary | ICD-10-CM | POA: Diagnosis not present

## 2017-06-20 ENCOUNTER — Ambulatory Visit: Payer: Medicare Other

## 2017-06-20 ENCOUNTER — Ambulatory Visit
Admission: RE | Admit: 2017-06-20 | Discharge: 2017-06-20 | Disposition: A | Discharge status: Home / Self Care | Payer: Medicare Other | Source: Ambulatory Visit | Attending: Radiation Oncology | Admitting: Radiation Oncology

## 2017-06-20 DIAGNOSIS — Z510 Encounter for antineoplastic radiation therapy: Secondary | ICD-10-CM | POA: Diagnosis not present

## 2017-06-21 ENCOUNTER — Ambulatory Visit: Payer: Medicare Other

## 2017-06-21 ENCOUNTER — Ambulatory Visit
Admission: RE | Admit: 2017-06-21 | Discharge: 2017-06-21 | Disposition: A | Discharge status: Home / Self Care | Payer: Medicare Other | Source: Ambulatory Visit | Attending: Radiation Oncology | Admitting: Radiation Oncology

## 2017-06-21 DIAGNOSIS — Z510 Encounter for antineoplastic radiation therapy: Secondary | ICD-10-CM | POA: Diagnosis not present

## 2017-06-24 ENCOUNTER — Ambulatory Visit
Admission: RE | Admit: 2017-06-24 | Discharge: 2017-06-24 | Disposition: A | Discharge status: Home / Self Care | Payer: Medicare Other | Source: Ambulatory Visit | Attending: Radiation Oncology | Admitting: Radiation Oncology

## 2017-06-24 ENCOUNTER — Ambulatory Visit: Payer: Medicare Other

## 2017-06-24 DIAGNOSIS — Z510 Encounter for antineoplastic radiation therapy: Secondary | ICD-10-CM | POA: Diagnosis not present

## 2017-06-25 ENCOUNTER — Ambulatory Visit: Payer: Medicare Other

## 2017-06-25 ENCOUNTER — Ambulatory Visit
Admission: RE | Admit: 2017-06-25 | Discharge: 2017-06-25 | Disposition: A | Discharge status: Home / Self Care | Payer: Medicare Other | Source: Ambulatory Visit | Attending: Radiation Oncology | Admitting: Radiation Oncology

## 2017-06-25 DIAGNOSIS — Z510 Encounter for antineoplastic radiation therapy: Secondary | ICD-10-CM | POA: Diagnosis not present

## 2017-06-26 ENCOUNTER — Ambulatory Visit: Payer: Medicare Other

## 2017-06-27 ENCOUNTER — Ambulatory Visit
Admission: RE | Admit: 2017-06-27 | Discharge: 2017-06-27 | Disposition: A | Discharge status: Home / Self Care | Payer: Medicare Other | Source: Ambulatory Visit | Attending: Radiation Oncology | Admitting: Radiation Oncology

## 2017-06-27 ENCOUNTER — Ambulatory Visit: Payer: Medicare Other

## 2017-06-27 DIAGNOSIS — Z510 Encounter for antineoplastic radiation therapy: Secondary | ICD-10-CM | POA: Diagnosis not present

## 2017-06-28 ENCOUNTER — Encounter: Payer: Self-pay | Admitting: Medical Oncology

## 2017-06-28 ENCOUNTER — Encounter: Payer: Self-pay | Admitting: Radiation Oncology

## 2017-06-28 ENCOUNTER — Ambulatory Visit
Admission: RE | Admit: 2017-06-28 | Discharge: 2017-06-28 | Disposition: A | Discharge status: Home / Self Care | Payer: Medicare Other | Source: Ambulatory Visit | Attending: Radiation Oncology | Admitting: Radiation Oncology

## 2017-06-28 DIAGNOSIS — Z510 Encounter for antineoplastic radiation therapy: Secondary | ICD-10-CM | POA: Diagnosis not present

## 2017-06-28 NOTE — Progress Notes (Signed)
Celebrated with Mr. Hatlestad as he completed radiation treatments today. He states it has been 40 long days but so happy to be finished. He has urinary frequency, some diarrhea and has noted some blood in stool. We discussed how these symptoms will begin to improve now that his has completed treatment. He has follow up with Ashlyn 08/07/17 and is encouraged to call with questions or concerns.

## 2017-06-28 NOTE — Progress Notes (Signed)
  Radiation Oncology         [phone]) [phone] ________________________________  Name: Jared Rhodes. MRN: [id number]  Date: 06/28/2017  DOB: Jared Rhodes, Jared Rhodes  End of Treatment Note  Diagnosis:  66 y.o. gentleman with Stage cT2b N0 M0adenocarcinoma of the prostate with Gleason Score of 4+4=8, and PSA of 238.     Indication for treatment:  Curative, Definitive Radiotherapy       Radiation treatment dates:   04/17/17 - 06/28/17  Site/dose:  1. The prostate, seminal vesicles, and pelvic lymph nodes were initially treated to 45 Gy in 25 fractions of 1.8 Gy  2. The prostate only was boosted to 75 Gy with 15 additional fractions of 2.0 Gy   Beams/energy:  1. The prostate, seminal vesicles, and pelvic lymph nodes were initially treated using helical intensity modulated radiotherapy delivering 6 megavolt photons. Image guidance was performed with megavoltage CT studies prior to each fraction. He was immobilized with a body fix lower extremity mold.  2. the prostate only was boosted using helical intensity modulated radiotherapy delivering 6 megavolt photons. Image guidance was performed with megavoltage CT studies prior to each fraction. He was immobilized with a body fix lower extremity mold.  Narrative: The patient tolerated radiation treatment relatively well.   The patient experienced some minor urinary irritation and modest fatigue.  The patient reported having some blood in stool (06/21/17. Patient also reported having episodes of diarrhea, occasional episodes of dysuria and nocturia x4-5. He described a steady urine stream without difficulty emptying his bladder.  Plan: The patient has completed radiation treatment. He will return to radiation oncology clinic for routine followup in one month. I advised him to call or return sooner if he has any questions or concerns related to his recovery or treatment. ________________________________  Sheral Apley. Tammi Klippel, M.D.  This document serves as a record  of services personally performed by Tyler Pita MD. It was created on his behalf by Delton Coombes, a trained medical scribe. The creation of this record is based on the scribe's personal observations and the provider's statements to them. This document has been checked and approved by the attending provider.

## 2017-08-02 ENCOUNTER — Other Ambulatory Visit (INDEPENDENT_AMBULATORY_CARE_PROVIDER_SITE_OTHER): Payer: Self-pay | Admitting: Orthopaedic Surgery

## 2017-08-02 ENCOUNTER — Ambulatory Visit (INDEPENDENT_AMBULATORY_CARE_PROVIDER_SITE_OTHER): Payer: Medicare Other

## 2017-08-02 ENCOUNTER — Ambulatory Visit (INDEPENDENT_AMBULATORY_CARE_PROVIDER_SITE_OTHER): Payer: Medicare Other | Admitting: Orthopaedic Surgery

## 2017-08-02 ENCOUNTER — Encounter (INDEPENDENT_AMBULATORY_CARE_PROVIDER_SITE_OTHER): Payer: Self-pay | Admitting: Orthopaedic Surgery

## 2017-08-02 DIAGNOSIS — M25572 Pain in left ankle and joints of left foot: Secondary | ICD-10-CM

## 2017-08-02 MED ORDER — NAPROXEN 500 MG PO TABS: 500.0000 mg | ORAL_TABLET | Freq: Two times a day (BID) | ORAL | 3 refills | Status: DC

## 2017-08-02 NOTE — Progress Notes (Signed)
Office Visit Note   Patient: Jared Rhodes.           Date of Birth: 03/02/1951           MRN: [id number] Visit Date: 08/02/2017              Requested by: No referring provider defined for this encounter. PCP: Patient, No Pcp Per   Assessment & Plan: Visit Diagnoses:  1. Pain in left ankle and joints of left foot     Plan: Overall impression is degenerative distal tibiofibular joint.  Recommend Cam Walker for support. Naprosyn was prescribed. Relative rest. If not better can consider injection. I will like to see him back in 6 months for his 2 year visit for his right hip replacement which is doing well. He needs standing AP pelvis and lateral frog leg on return.  Follow-Up Instructions: Return in about 6 months (around 02/02/2018).   Orders:  Orders Placed This Encounter  Procedures  . XR Ankle Complete Left   Meds ordered this encounter  Medications  . naproxen (NAPROSYN) 500 MG tablet    Sig: Take 1 tablet (500 mg total) by mouth 2 (two) times daily with a meal.    Dispense:  30 tablet    Refill:  3      Procedures: No procedures performed   Clinical Data: No additional findings.   Subjective: Chief Complaint  Patient presents with  . Left Ankle - Edema, Pain    Jared Rhodes is coming in with left ankle pain for several weeks. He states that originally was very swollen. He had trouble ambulating. He took over-the-counter Tylenol. He is status post ORIF of an open left ankle fracture dislocation approximately 3 years ago. He has done well recently. He denies any fevers chills or constitutional symptoms. Denies any new injuries.    Review of Systems  Constitutional: Negative.   All other systems reviewed and are negative.    Objective: Vital Signs: There were no vitals taken for this visit.  Physical Exam  Constitutional: He is oriented to person, place, and time. He appears well-developed and well-nourished.  Pulmonary/Chest: Effort normal.    Abdominal: Soft.  Neurological: He is alert and oriented to person, place, and time.  Skin: Skin is warm.  Psychiatric: He has a normal mood and affect. His behavior is normal. Judgment and thought content normal.  Nursing note and vitals reviewed.   Ortho Exam Left ankle exam shows fully heal traumatic and surgical scars. There is no swelling or warmth. He has excellent range of motion of his ankle joint. Mild pain with squeezing of the distal tibiofibular joint. Specialty Comments:  No specialty comments available.  Imaging: Xr Ankle Complete Left  Result Date: 08/02/2017 Stable fixation of the left ankle with degenerative distal tibiofibular joint    PMFS History: Patient Active Problem List   Diagnosis Date Noted  . Prostate cancer ([place]) 02/01/2017  . Osteoarthritis of right hip 03/02/2016  . Hip joint replacement status 03/02/2016  . Alcohol intoxication ([place]) 07/17/2014  . Type III open fracture dislocation of left ankle joint 07/17/2014  . HTN (hypertension) 04/17/2014  . Severe obesity (BMI >= 40) ([place]) 04/17/2014   Past Medical History:  Diagnosis Date  . Hypertension   . Prostate cancer [facility])     Family History  Problem Relation Age of Onset  . Alcohol abuse Mother   . Hyperlipidemia Brother   . Heart disease Sister     Past Surgical  History:  Procedure Laterality Date  . I&D EXTREMITY Left 07/17/2014   Procedure: IRRIGATION AND DEBRIDEMENT EXTREMITY;  Surgeon: Marianna Payment, MD;  Location: [facility];  Service: Orthopedics;  Laterality: Left;  . I&D EXTREMITY Left 07/19/2014   Procedure: IRRIGATION AND DEBRIDEMENT EXTREMITY;  Surgeon: Marianna Payment, MD;  Location: [facility];  Service: Orthopedics;  Laterality: Left;  . ORIF ANKLE FRACTURE Left 07/19/2014   Procedure: OPEN REDUCTION INTERNAL FIXATION (ORIF) ANKLE FRACTURE;  Surgeon: Marianna Payment, MD;  Location: [facility];  Service: Orthopedics;  Laterality: Left;  . PROSTATE BIOPSY    . TONSILLECTOMY     . TOTAL HIP ARTHROPLASTY Right 03/02/2016   Procedure: RIGHT TOTAL HIP ARTHROPLASTY ANTERIOR APPROACH;  Surgeon: Leandrew Koyanagi, MD;  Location: [facility];  Service: Orthopedics;  Laterality: Right;   Social History   Occupational History  . 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 Smoking status: Current Some Day Smoker    Packs/day: 0.25    Years: 30.00    Types: Cigarettes  . Smokeless tobacco: Never Used  . Alcohol use Yes     Comment: every now and then  . Drug use: No  . Sexual activity: Yes    Partners: Female

## 2017-08-07 ENCOUNTER — Ambulatory Visit
Admission: RE | Admit: 2017-08-07 | Discharge: 2017-08-07 | Disposition: A | Discharge status: Home / Self Care | Payer: Medicare Other | Source: Ambulatory Visit | Attending: Urology | Admitting: Urology

## 2017-08-07 ENCOUNTER — Encounter: Payer: Self-pay | Admitting: Urology

## 2017-08-07 VITALS — BP 138/93 | HR 90 | Temp 98.7°F | Resp 20 | Wt 207.5 lb

## 2017-08-07 DIAGNOSIS — C61 Malignant neoplasm of prostate: Secondary | ICD-10-CM

## 2017-08-07 DIAGNOSIS — Z923 Personal history of irradiation: Secondary | ICD-10-CM | POA: Insufficient documentation

## 2017-08-07 DIAGNOSIS — Z79899 Other long term (current) drug therapy: Secondary | ICD-10-CM | POA: Insufficient documentation

## 2017-08-07 NOTE — Progress Notes (Signed)
Radiation Oncology         [phone]) [phone] ________________________________  Name: Jared Rhodes. MRN: [id number]  Date: 08/07/2017  DOB: April 09, 1951  Post Treatment Note  CC: Patient, No Pcp Per  Raynelle Bring, MD  Diagnosis:   66 y.o. gentleman with Stage cT2b N0 M0adenocarcinoma of the prostate with Gleason Score of 4+4=8, and PSA of 238.     Interval Since Last Radiation:  5 weeks  04/17/17 - 06/28/17: 1. The prostate, seminal vesicles, and pelvic lymph nodes were initially treated to 45 Gy in 25 fractions of 1.8 Gy  2. The prostate only was boosted to 75 Gy with 15 additional fractions of 2.0 Gy    Narrative:  The patient returns today for routine follow-up.  He tolerated radiation treatment relatively well with only minor urinary irritation and modest fatigue.  He reported having some blood in stool on a single occasion on 06/21/17 and also reported having episodes of diarrhea, occasional episodes of dysuria and nocturia x4-5. He described a steady urine stream without difficulty emptying his bladder.                             On review of systems, the patient states that he is doing well overall. His lower urinary tract symptoms of a most completely resolved and he is only getting up 1-2 times per night at this point. He continues with mild increased daytime frequency but denies dysuria, gross hematuria, urgency, intermittency, weak stream or straining to void. He feels he is emptying his bladder well on voiding. He denies abdominal pain, nausea, vomiting or diarrhea. He reports a healthy appetite and is maintaining his weight.  He continues on ADT under the care and direction of Dr. Alinda Money. He has continued with hot flashes and significantly decreased libido. He is most concerned with a decrease in his libido and the potential strain on his current relationship.  ALLERGIES:  is allergic to penicillins.  Meds: Current Outpatient Prescriptions  Medication Sig Dispense Refill  .  ibuprofen (ADVIL,MOTRIN) 800 MG tablet Take 800 mg by mouth every 8 (eight) hours as needed for mild pain or moderate pain.    . naproxen (NAPROSYN) 500 MG tablet TAKE 1 TABLET BY MOUTH TWICE DAILY WITH A MEAL 180 tablet 3  . losartan (COZAAR) 50 MG tablet     . permethrin (ELIMITE) 5 % cream Apply to body from neck down once at night. Sleep in medication and rinse off the next morning. Repeat in 1 week if symptoms persist. (Patient not taking: Reported on 08/07/2017) 60 g 1   No current facility-administered medications for this encounter.     Physical Findings:  weight is 207 lb 8 oz (94.1 kg). His oral temperature is 98.7 F (37.1 C). His blood pressure is 138/93 (abnormal) and his pulse is 90. His respiration is 20 and oxygen saturation is 99%.  Pain Assessment Pain Score: 0-No pain/10 In general this is a well appearing African-American male in no acute distress. He's alert and oriented x4 and appropriate throughout the examination. Cardiopulmonary assessment is negative for acute distress and he exhibits normal effort.   Lab Findings: Lab Results  Component Value Date   WBC 7.3 03/03/2016   HGB 10.5 (L) 03/03/2016   HCT 32.2 (L) 03/03/2016   MCV 88.7 03/03/2016   PLT 192 03/03/2016     Radiographic Findings: Xr Ankle Complete Left  Result Date: 08/02/2017 Stable fixation of the  left ankle with degenerative distal tibiofibular joint   Impression/Plan: 1. 66 y.o. gentleman with Stage cT2b N0 M0adenocarcinoma of the prostate with Gleason Score of 4+4=8, and PSA of 238.    He will continue to follow up with urology for ongoing PSA determinations and has an appointment scheduled with Dr. Alinda Money on 09/04/2017 at 9 AM. We discussed the plan to complete a total of 2 years of ADT but he is unsure whether his relationship will be able to tolerate the full 2 year course of treatment. I have encouraged him to discuss this further at the time of follow-up with Dr. Alinda Money.  He understands  what to expect with regards to PSA monitoring going forward. I will look forward to following his response to treatment via correspondence with urology, and would be happy to continue to participate in his care if clinically indicated. I talked to the patient about what to expect in the future, including his risk for erectile dysfunction and rectal bleeding. I encouraged him to call or return to the office if he has any questions regarding his previous radiation or possible radiation side effects. He was comfortable with this plan and will follow up as needed.    Nicholos Johns, PA-C

## 2017-09-29 ENCOUNTER — Emergency Department (HOSPITAL_COMMUNITY)
Admission: EM | Admit: 2017-09-29 | Discharge: 2017-09-29 | Disposition: A | Discharge status: Home / Self Care | Payer: Medicare Other | Attending: Emergency Medicine | Admitting: Emergency Medicine

## 2017-09-29 ENCOUNTER — Encounter (HOSPITAL_COMMUNITY): Payer: Self-pay | Admitting: Emergency Medicine

## 2017-09-29 DIAGNOSIS — R51 Headache: Secondary | ICD-10-CM | POA: Diagnosis not present

## 2017-09-29 DIAGNOSIS — Z8546 Personal history of malignant neoplasm of prostate: Secondary | ICD-10-CM | POA: Insufficient documentation

## 2017-09-29 DIAGNOSIS — Z96641 Presence of right artificial hip joint: Secondary | ICD-10-CM | POA: Insufficient documentation

## 2017-09-29 DIAGNOSIS — H5712 Ocular pain, left eye: Secondary | ICD-10-CM | POA: Diagnosis present

## 2017-09-29 DIAGNOSIS — F17210 Nicotine dependence, cigarettes, uncomplicated: Secondary | ICD-10-CM | POA: Diagnosis not present

## 2017-09-29 DIAGNOSIS — Z79899 Other long term (current) drug therapy: Secondary | ICD-10-CM | POA: Insufficient documentation

## 2017-09-29 DIAGNOSIS — I10 Essential (primary) hypertension: Secondary | ICD-10-CM | POA: Diagnosis not present

## 2017-09-29 MED ORDER — FLUORESCEIN SODIUM 1 MG OP STRP
1.0000 | ORAL_STRIP | Freq: Once | OPHTHALMIC | Status: DC
  Filled 2017-09-29: qty 1

## 2017-09-29 MED ORDER — HYDROCODONE-ACETAMINOPHEN 5-325 MG PO TABS: 1.0000 | ORAL_TABLET | Freq: Once | ORAL | Status: DC

## 2017-09-29 MED ORDER — ERYTHROMYCIN 5 MG/GM OP OINT: TOPICAL_OINTMENT | Freq: Once | OPHTHALMIC | Status: DC

## 2017-09-29 MED ORDER — HYDROCODONE-ACETAMINOPHEN 5-325 MG PO TABS: 1.0000 | ORAL_TABLET | Freq: Four times a day (QID) | ORAL | 0 refills | Status: DC | PRN

## 2017-09-29 MED ORDER — TETRACAINE HCL 0.5 % OP SOLN
2.0000 [drp] | Freq: Once | OPHTHALMIC | Status: DC
  Filled 2017-09-29: qty 4

## 2017-09-29 MED ORDER — ERYTHROMYCIN 5 MG/GM OP OINT: TOPICAL_OINTMENT | OPHTHALMIC | 0 refills | Status: DC

## 2017-09-29 NOTE — ED Provider Notes (Signed)
[facility] DEPT Provider Note   CSN: [id number] Arrival date & time: 09/29/17  1445     History   Chief Complaint Chief Complaint  Patient presents with  . eye redness  . Headache    HPI Jared Venne. is a 66 y.o. male.   Eye Pain  This is a new problem. The current episode started 6 to 12 hours ago. The problem occurs constantly. The problem has not changed since onset.Pertinent negatives include no chest pain, no abdominal pain and no headaches. Nothing aggravates the symptoms. Nothing relieves the symptoms. He has tried nothing for the symptoms.    Past Medical History:  Diagnosis Date  . Hypertension   . Prostate cancer [facility])     Patient Active Problem List   Diagnosis Date Noted  . Prostate cancer ([place]) 02/01/2017  . Osteoarthritis of right hip 03/02/2016  . Hip joint replacement status 03/02/2016  . Alcohol intoxication ([place]) 07/17/2014  . Type III open fracture dislocation of left ankle joint 07/17/2014  . HTN (hypertension) 04/17/2014  . Severe obesity (BMI >= 40) ([place]) 04/17/2014    Past Surgical History:  Procedure Laterality Date  . I&D EXTREMITY Left 07/17/2014   Procedure: IRRIGATION AND DEBRIDEMENT EXTREMITY;  Surgeon: Marianna Payment, MD;  Location: [place];  Service: Orthopedics;  Laterality: Left;  . I&D EXTREMITY Left 07/19/2014   Procedure: IRRIGATION AND DEBRIDEMENT EXTREMITY;  Surgeon: Marianna Payment, MD;  Location: [facility];  Service: Orthopedics;  Laterality: Left;  . ORIF ANKLE FRACTURE Left 07/19/2014   Procedure: OPEN REDUCTION INTERNAL FIXATION (ORIF) ANKLE FRACTURE;  Surgeon: Marianna Payment, MD;  Location: [place];  Service: Orthopedics;  Laterality: Left;  . PROSTATE BIOPSY    . TONSILLECTOMY    . TOTAL HIP ARTHROPLASTY Right 03/02/2016   Procedure: RIGHT TOTAL HIP ARTHROPLASTY ANTERIOR APPROACH;  Surgeon: Leandrew Koyanagi, MD;  Location: [facility];  Service: Orthopedics;  Laterality: Right;       Home Medications    Prior  to Admission medications   Medication Sig Start Date End Date Taking? Authorizing Provider  HYDROcodone-acetaminophen (NORCO/VICODIN) 5-325 MG tablet Take 1 tablet by mouth every 6 (six) hours as needed for severe pain. 09/29/17   Rondall Radigan, Corene Cornea, MD  ibuprofen (ADVIL,MOTRIN) 800 MG tablet Take 800 mg by mouth every 8 (eight) hours as needed for mild pain or moderate pain.    [provider]  losartan (COZAAR) 50 MG tablet  05/02/17   [provider]  naproxen (NAPROSYN) 500 MG tablet TAKE 1 TABLET BY MOUTH TWICE DAILY WITH A MEAL 08/02/17   Leandrew Koyanagi, MD  permethrin (ELIMITE) 5 % cream Apply to body from neck down once at night. Sleep in medication and rinse off the next morning. Repeat in 1 week if symptoms persist. Patient not taking: Reported on 08/07/2017 06/04/17   Ward, Ozella Almond, PA-C    Family History Family History  Problem Relation Age of Onset  . Alcohol abuse Mother   . Hyperlipidemia Brother   . Heart disease Sister     Social History Social History  Substance Use Topics  . Smoking status: Current Some Day Smoker    Packs/day: 0.25    Years: 30.00    Types: Cigarettes  . Smokeless tobacco: Never Used  . Alcohol use Yes     Comment: every now and then     Allergies   Penicillins   Review of Systems Review of Systems  Eyes: Positive for pain.  Cardiovascular: Negative for chest pain.  Gastrointestinal: Negative for abdominal pain.  Neurological: Negative for headaches.  All other systems reviewed and are negative.    Physical Exam Updated Vital Signs BP (!) 127/96   Pulse 76   Temp 98.3 F (36.8 C) (Oral)   Resp 16   SpO2 95%   Physical Exam  Constitutional: He appears well-developed and well-nourished.  HENT:  Head: Normocephalic and atraumatic.  Eyes: Pupils are equal, round, and reactive to light. EOM are normal.  Left lateral subconjunctival hemorrhage. [facility] with evidence of some type of ulcer like lesion just  lateral to corneal IOP in both eyes was 9 Slit lamp revealed an ulcer that was slightly brown in color Visual acuity normal  Neck: Normal range of motion.  Cardiovascular: Normal rate.   Pulmonary/Chest: Effort normal. No respiratory distress.  Abdominal: He exhibits no distension.  Musculoskeletal: Normal range of motion.  Neurological: He is alert.  Nursing note and vitals reviewed.    ED Treatments / Results  Labs (all labs ordered are listed, but only abnormal results are displayed) Labs Reviewed - No data to display  EKG  EKG Interpretation None       Radiology No results found.  Procedures Procedures (including critical care time)  Medications Ordered in ED Medications  fluorescein ophthalmic strip 1 strip (not administered)  tetracaine (PONTOCAINE) 0.5 % ophthalmic solution 2 drop (not administered)  erythromycin ophthalmic ointment (not administered)  HYDROcodone-acetaminophen (NORCO/VICODIN) 5-325 MG per tablet 1 tablet (not administered)     Initial Impression / Assessment and Plan / ED Course  I have reviewed the triage vital signs and the nursing notes.  Pertinent labs & imaging results that were available during my care of the patient were reviewed by me and considered in my medical decision making (see chart for details).    Considered possible temporal arteritis however the pain is having is all in his eye and his orbit. He has no change in her chart her movements. He has no changes vision appears interocular pressures are normal. I think this unlikely be glaucoma. I don't see foreign bodies or any evidence of open globe. He does have this lesion lateral to his cornea and diffuse injection of his conjunctiva. We'll treat with  Antibiotic ointment for concern of possible corneal ulcer. No evidence of dendritic lesions. Will ask to see ophthalmology within a couple days.  Final Clinical Impressions(s) / ED Diagnoses   Final diagnoses:  Pain of left  eye    New Prescriptions New Prescriptions   HYDROCODONE-ACETAMINOPHEN (NORCO/VICODIN) 5-325 MG TABLET    Take 1 tablet by mouth every 6 (six) hours as needed for severe pain.     Merrily Pew, MD 09/29/17 1728

## 2017-09-29 NOTE — ED Triage Notes (Signed)
Pt reports he woke a HA around L temple area. Pt has redness on his L eye. Denies visual disturbances. No n/v

## 2017-12-05 ENCOUNTER — Encounter: Payer: Self-pay | Admitting: Gastroenterology

## 2017-12-18 IMAGING — CR DG PORTABLE PELVIS
2 series · 2 of 2 positions shown · non-contrast
Comparison: Earlier same day

CLINICAL DATA: Patient status post right hip arthroplasty.

EXAM:
PORTABLE PELVIS 1-2 VIEWS

[AP (1 of 2)]
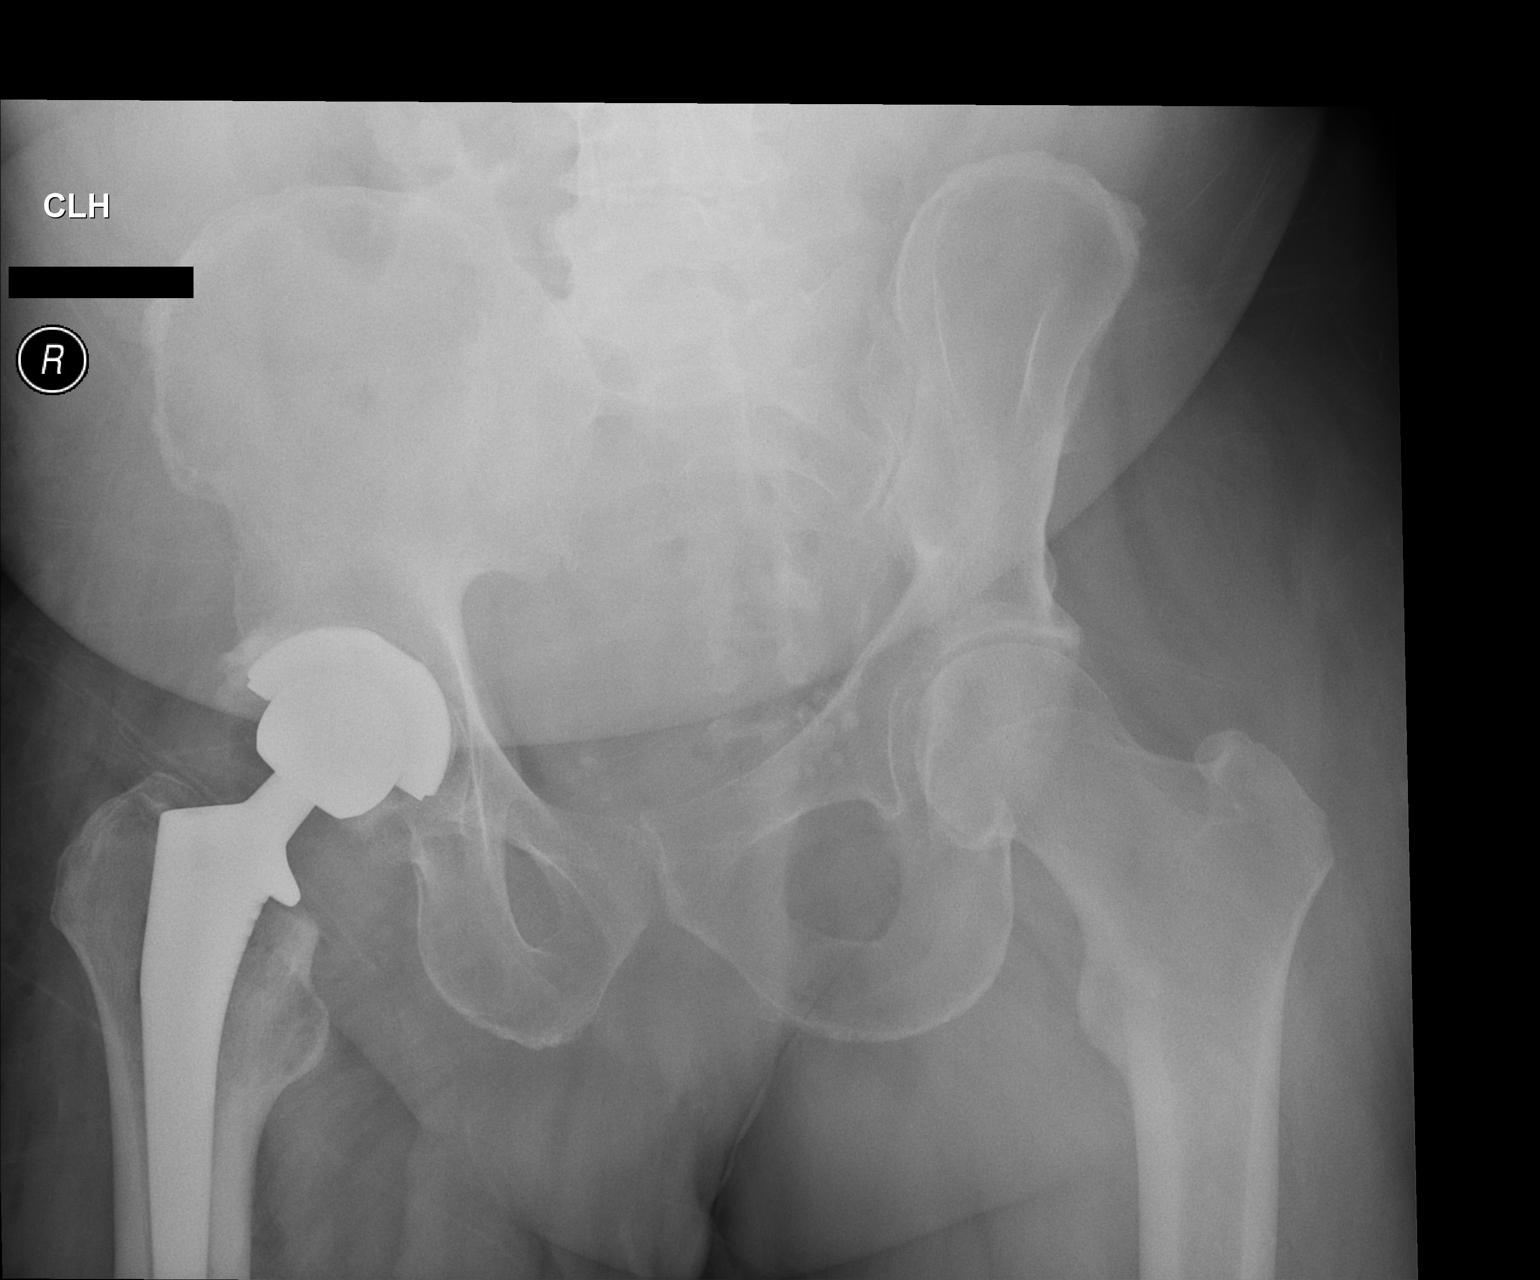

[AP (2 of 2)]
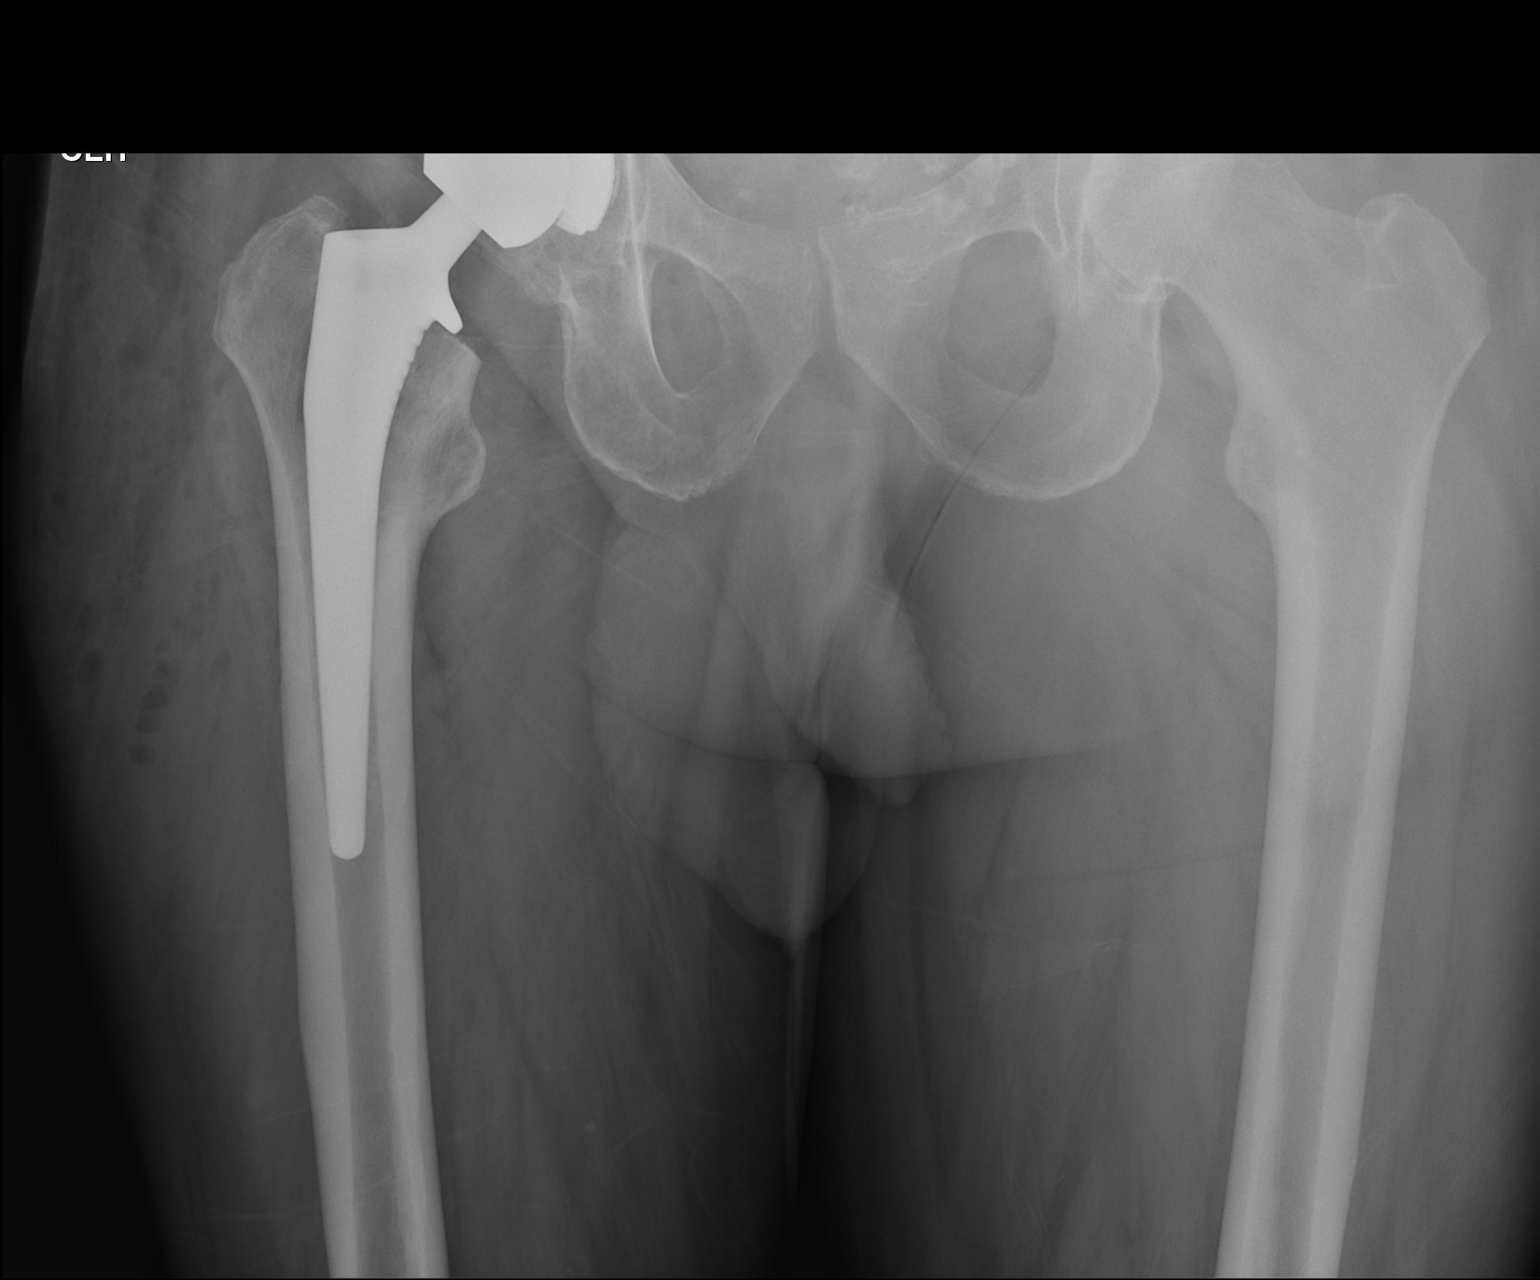

[2 of 2 positions shown; findings below may reference images not displayed]

FINDINGS: Patient status post right hip arthroplasty. No evidence for acute
osseous abnormality. Lumbar spine degenerative changes. Pelvic
phleboliths. Soft tissue gas overlying the proximal right femur.
IMPRESSION: Patient status post right hip arthroplasty.

## 2018-01-14 ENCOUNTER — Other Ambulatory Visit: Payer: Self-pay

## 2018-01-14 ENCOUNTER — Ambulatory Visit (AMBULATORY_SURGERY_CENTER): Payer: Self-pay | Admitting: *Deleted

## 2018-01-14 VITALS — Ht 68.0 in | Wt 211.0 lb

## 2018-01-14 DIAGNOSIS — Z1211 Encounter for screening for malignant neoplasm of colon: Secondary | ICD-10-CM

## 2018-01-14 MED ORDER — NA SULFATE-K SULFATE-MG SULF 17.5-3.13-1.6 GM/177ML PO SOLN
1.0000 | Freq: Once | ORAL | 0 refills | Status: AC
Start: 2018-01-14 — End: 2018-01-14

## 2018-01-14 NOTE — Progress Notes (Signed)
Patient denies any allergies to eggs or soy. Patient denies any problems with anesthesia/sedation. Patient denies any oxygen use at home. Patient denies taking any diet/weight loss medications or blood thinners. EMMI education declined by pt. No email or computer use.

## 2018-01-15 ENCOUNTER — Encounter: Payer: Self-pay | Admitting: Gastroenterology

## 2018-01-27 ENCOUNTER — Telehealth: Payer: Self-pay | Admitting: Gastroenterology

## 2018-01-27 NOTE — Telephone Encounter (Signed)
Returned patients call. Patient took the first part of his prep this morning instead of 6 pm tonight. Patient was instructed to continue on clear liquids all day and do the second part of his prep five hours prior to his procedure which is 5:30 Am. Patient verbalizes understanding.   Riki Sheer, LPN  ( Admitting )

## 2018-01-28 ENCOUNTER — Other Ambulatory Visit: Payer: Self-pay

## 2018-01-28 ENCOUNTER — Ambulatory Visit (AMBULATORY_SURGERY_CENTER): Payer: Medicare Other | Admitting: Gastroenterology

## 2018-01-28 ENCOUNTER — Encounter: Payer: Self-pay | Admitting: Gastroenterology

## 2018-01-28 VITALS — BP 138/78 | HR 80 | Temp 98.4°F | Resp 20 | Ht 68.0 in | Wt 211.0 lb

## 2018-01-28 DIAGNOSIS — Z1212 Encounter for screening for malignant neoplasm of rectum: Secondary | ICD-10-CM

## 2018-01-28 DIAGNOSIS — Z1211 Encounter for screening for malignant neoplasm of colon: Secondary | ICD-10-CM

## 2018-01-28 DIAGNOSIS — D122 Benign neoplasm of ascending colon: Secondary | ICD-10-CM

## 2018-01-28 MED ORDER — SODIUM CHLORIDE 0.9 % IV SOLN: 500.0000 mL | Freq: Once | INTRAVENOUS | Status: DC

## 2018-01-28 NOTE — Progress Notes (Signed)
Spontaneous respirations throughout. VSS. Resting comfortably. To PACU on room air. Report to  RN. 

## 2018-01-28 NOTE — Op Note (Signed)
[facility] Patient Name: Jared Rhodes Procedure Date: 01/28/2018 9:51 AM MRN: [id number] Endoscopist: [place]ow[place]. Loletha Carrow , MD Age: 67 Referring MD:  Date of Birth: [birth year] Gender: Male Account #: [id number] Procedure:                Colonoscopy Indications:              Screening for colorectal malignant neoplasm, This                            is the patient's first colonoscopy Medicines:                Monitored Anesthesia Care Procedure:                Pre-Anesthesia Assessment:                           - Prior to the procedure, a History and Physical                            was performed, and patient medications and                            allergies were reviewed. The patient's tolerance of                            previous anesthesia was also reviewed. The risks                            and benefits of the procedure and the sedation                            options and risks were discussed with the patient.                            All questions were answered, and informed consent                            was obtained. Prior Anticoagulants: The patient has                            taken no previous anticoagulant or antiplatelet                            agents. ASA Grade Assessment: II - A patient with                            mild systemic disease. After reviewing the risks                            and benefits, the patient was deemed in                            satisfactory condition to undergo the procedure.  After obtaining informed consent, the colonoscope                            was passed under direct vision. Throughout the                            procedure, the patient's blood pressure, pulse, and                            oxygen saturations were monitored continuously. The                            Colonoscope was introduced through the anus and                            advanced to the the cecum,  identified by                            appendiceal orifice and ileocecal valve. The                            colonoscopy was performed without difficulty. The                            patient tolerated the procedure well. The quality                            of the bowel preparation was good. The ileocecal                            valve, appendiceal orifice, and rectum were                            photographed. The quality of the bowel preparation                            was evaluated using the BBPS [facility] Bowel                            Preparation Scale) with scores of: Right Colon = 2,                            Transverse Colon = 2 and Left Colon = 2. The total                            BBPS score equals 6. The bowel preparation used was                            SUPREP. Scope In: 9:56:28 AM Scope Out: 10:10:57 AM Scope Withdrawal Time: 0 hours 10 minutes 20 seconds  Total Procedure Duration: 0 hours 14 minutes 29 seconds  Findings:                 The perianal and digital rectal  examinations were                            normal.                           A few small-mouthed diverticula were found in the                            left colon.                           A 2 mm polyp was found in the ascending colon. The                            polyp was sessile. The polyp was removed with a                            cold biopsy forceps. Resection and retrieval were                            complete.                           The mucosa vascular pattern in the rectum was                            diffusely increased, consistent with mild radiation                            proctopathy.                           The exam was otherwise without abnormality on                            direct and retroflexion views. Complications:            No immediate complications. Estimated Blood Loss:     Estimated blood loss: none. Impression:               -  Diverticulosis in the left colon.                           - One 2 mm polyp in the ascending colon, removed                            with a cold biopsy forceps. Resected and retrieved.                           - Increased mucosa vascular pattern in the rectum                            (radiation proctopathy).                           - The examination was otherwise  normal on direct                            and retroflexion views. Recommendation:           - Patient has a contact number available for                            emergencies. The signs and symptoms of potential                            delayed complications were discussed with the                            patient. Return to normal activities tomorrow.                            Written discharge instructions were provided to the                            patient.                           - Resume previous diet.                           - Continue present medications.                           - Await pathology results.                           - Repeat colonoscopy is recommended for                            surveillance. The colonoscopy date will be                            determined after pathology results from today's                            exam become available for review. [name] L. Loletha Carrow, MD 01/28/2018 10:16:59 AM This report has been signed electronically.

## 2018-01-28 NOTE — Progress Notes (Signed)
Called to room to assist during endoscopic procedure.  Patient ID and intended procedure confirmed with present staff. Received instructions for my participation in the procedure from the performing physician.  

## 2018-01-28 NOTE — Progress Notes (Signed)
Pt's states no medical or surgical changes since previsit or office visit. 

## 2018-01-28 NOTE — Patient Instructions (Signed)
YOU HAD AN ENDOSCOPIC PROCEDURE TODAY AT [facility]:   Refer to the procedure report that was given to you for any specific questions about what was found during the examination.  If the procedure report does not answer your questions, please call your gastroenterologist to clarify.  If you requested that your care partner not be given the details of your procedure findings, then the procedure report has been included in a sealed envelope for you to review at your convenience later.  YOU SHOULD EXPECT: Some feelings of bloating in the abdomen. Passage of more gas than usual.  Walking can help get rid of the air that was put into your GI tract during the procedure and reduce the bloating. If you had a lower endoscopy (such as a colonoscopy or flexible sigmoidoscopy) you may notice spotting of blood in your stool or on the toilet paper. If you underwent a bowel prep for your procedure, you may not have a normal bowel movement for a few days.  Please Note:  You might notice some irritation and congestion in your nose or some drainage.  This is from the oxygen used during your procedure.  There is no need for concern and it should clear up in a day or so.  SYMPTOMS TO REPORT IMMEDIATELY:   Following lower endoscopy (colonoscopy or flexible sigmoidoscopy):  Excessive amounts of blood in the stool  Significant tenderness or worsening of abdominal pains  Swelling of the abdomen that is new, acute  Fever of 100F or higher  For urgent or emergent issues, a gastroenterologist can be reached at any hour by calling [phone].   DIET:  We do recommend a small meal at first, but then you may proceed to your regular diet.  Drink plenty of fluids but you should avoid alcoholic beverages for 24 hours.  ACTIVITY:  You should plan to take it easy for the rest of today and you should NOT DRIVE or use heavy machinery until tomorrow (because of the sedation medicines used during the test).     FOLLOW UP: Our staff will call the number listed on your records the next business day following your procedure to check on you and address any questions or concerns that you may have regarding the information given to you following your procedure. If we do not reach you, we will leave a message.  However, if you are feeling well and you are not experiencing any problems, there is no need to return our call.  We will assume that you have returned to your regular daily activities without incident.  If any biopsies were taken you will be contacted by phone or by letter within the next 1-3 weeks.  Please call us at [phone] if you have not heard about the biopsies in 3 weeks.   Await for biopsy results to determine next repeat Colonoscopy Polyps (handout given) Diverticulosis (handout given)   SIGNATURES/CONFIDENTIALITY: You and/or your care partner have signed paperwork which will be entered into your electronic medical record.  These signatures attest to the fact that that the information above on your After Visit Summary has been reviewed and is understood.  Full responsibility of the confidentiality of this discharge information lies with you and/or your care-partner.

## 2018-01-29 ENCOUNTER — Telehealth: Payer: Self-pay | Admitting: *Deleted

## 2018-01-29 NOTE — Telephone Encounter (Signed)
  Follow up Call-  Call back number 01/28/2018  Post procedure Call Back phone  # [phone]  Permission to leave phone message Yes  Some recent data might be hidden     Patient questions:  Do you have a fever, pain , or abdominal swelling? No. Pain Score  0 *  Have you tolerated food without any problems? Yes.    Have you been able to return to your normal activities? Yes.    Do you have any questions about your discharge instructions: Diet   No. Medications  No. Follow up visit  No.  Do you have questions or concerns about your Care? No.  Actions: * If pain score is 4 or above: No action needed, pain <4.

## 2018-01-30 ENCOUNTER — Emergency Department (HOSPITAL_COMMUNITY)
Admission: EM | Admit: 2018-01-30 | Discharge: 2018-01-30 | Disposition: A | Discharge status: Home / Self Care | Payer: Medicare Other | Attending: Emergency Medicine | Admitting: Emergency Medicine

## 2018-01-30 ENCOUNTER — Other Ambulatory Visit: Payer: Self-pay

## 2018-01-30 ENCOUNTER — Telehealth: Payer: Self-pay | Admitting: Gastroenterology

## 2018-01-30 ENCOUNTER — Encounter: Payer: Self-pay | Admitting: Gastroenterology

## 2018-01-30 DIAGNOSIS — F17210 Nicotine dependence, cigarettes, uncomplicated: Secondary | ICD-10-CM | POA: Insufficient documentation

## 2018-01-30 DIAGNOSIS — Z8546 Personal history of malignant neoplasm of prostate: Secondary | ICD-10-CM | POA: Insufficient documentation

## 2018-01-30 DIAGNOSIS — J111 Influenza due to unidentified influenza virus with other respiratory manifestations: Secondary | ICD-10-CM | POA: Insufficient documentation

## 2018-01-30 DIAGNOSIS — Z96641 Presence of right artificial hip joint: Secondary | ICD-10-CM | POA: Insufficient documentation

## 2018-01-30 DIAGNOSIS — I10 Essential (primary) hypertension: Secondary | ICD-10-CM | POA: Insufficient documentation

## 2018-01-30 DIAGNOSIS — Z79899 Other long term (current) drug therapy: Secondary | ICD-10-CM | POA: Insufficient documentation

## 2018-01-30 DIAGNOSIS — J029 Acute pharyngitis, unspecified: Secondary | ICD-10-CM | POA: Diagnosis present

## 2018-01-30 DIAGNOSIS — R69 Illness, unspecified: Secondary | ICD-10-CM

## 2018-01-30 NOTE — Telephone Encounter (Signed)
Patient states he is "has a fever", does not have a thermometer, but is warm to the touch, chills. He started having a headache and right flank pain yesterday. Denies any N/V. He does take his naproxen once daily for his ankle, but has not tried any other OTC pain medication.

## 2018-01-30 NOTE — Telephone Encounter (Signed)
Thanks for letting me know. He only had a small polyp biopsied off the colon, so I do not think this is related to his procedure. Could be infection of some sort, maybe kidney stone.  Recommend some tylenol and call PCP if persists.

## 2018-01-30 NOTE — ED Triage Notes (Signed)
Pt complaint of sore throat, headache, and leg aches onset yesterday afternoon at work. Pt adds has right lower back pain; denies n/v/d or GU symptoms.

## 2018-01-30 NOTE — Telephone Encounter (Signed)
Patient calling back in regards of this. States he is in a lot of pain and needs a call back asap.

## 2018-01-30 NOTE — Telephone Encounter (Signed)
Spoke to patient, he is headed to his PCP office now.

## 2018-01-30 NOTE — ED Provider Notes (Signed)
[facility] DEPT Provider Note   CSN: [id number] Arrival date & time: 01/30/18  1112     History   Chief Complaint Chief Complaint  Patient presents with  . Sore Throat  . Headache  . Generalized Body Aches    HPI Rashard Ryle. is a 67 y.o. male.  HPI   67 year old male with body aches, sore throat, headache and subjective fevers.  Symptoms started yesterday afternoon at work.  Feels achy all over.  Occasional nonproductive cough.  No shortness of breath. Mild nausea. No v/d.   Past Medical History:  Diagnosis Date  . Hypertension   . Prostate cancer ([place]) 2018   radation 40 treatments     Patient Active Problem List   Diagnosis Date Noted  . Prostate cancer ([place]) 02/01/2017  . Osteoarthritis of right hip 03/02/2016  . Hip joint replacement status 03/02/2016  . Alcohol intoxication ([place]) 07/17/2014  . Type III open fracture dislocation of left ankle joint 07/17/2014  . HTN (hypertension) 04/17/2014  . Severe obesity (BMI >= 40) ([place]) 04/17/2014    Past Surgical History:  Procedure Laterality Date  . I&D EXTREMITY Left 07/17/2014   Procedure: IRRIGATION AND DEBRIDEMENT EXTREMITY;  Surgeon: Marianna Payment, MD;  Location: [facility];  Service: Orthopedics;  Laterality: Left;  . I&D EXTREMITY Left 07/19/2014   Procedure: IRRIGATION AND DEBRIDEMENT EXTREMITY;  Surgeon: Marianna Payment, MD;  Location: [facility];  Service: Orthopedics;  Laterality: Left;  . ORIF ANKLE FRACTURE Left 07/19/2014   Procedure: OPEN REDUCTION INTERNAL FIXATION (ORIF) ANKLE FRACTURE;  Surgeon: Marianna Payment, MD;  Location: [facility];  Service: Orthopedics;  Laterality: Left;  . PROSTATE BIOPSY    . TONSILLECTOMY    . TOTAL HIP ARTHROPLASTY Right 03/02/2016   Procedure: RIGHT TOTAL HIP ARTHROPLASTY ANTERIOR APPROACH;  Surgeon: Leandrew Koyanagi, MD;  Location: [facility];  Service: Orthopedics;  Laterality: Right;       Home Medications    Prior to Admission  medications   Medication Sig Start Date End Date Taking? Authorizing Provider  losartan (COZAAR) 50 MG tablet Take 50 mg by mouth daily.  05/02/17  Yes [provider]  Multiple Vitamins-Minerals (CENTRUM SILVER PO) Take 1 tablet by mouth daily.   Yes [provider]  naproxen (NAPROSYN) 500 MG tablet TAKE 1 TABLET BY MOUTH TWICE DAILY WITH A MEAL 08/02/17  Yes Leandrew Koyanagi, MD    Family History Family History  Problem Relation Age of Onset  . Alcohol abuse Mother   . Hyperlipidemia Brother   . Heart disease Sister   . Colon cancer Neg Hx     Social History Social History   Tobacco Use  . Smoking status: Current Some Day Smoker    Packs/day: 0.50    Years: 30.00    Pack years: 15.00    Types: Cigarettes  . Smokeless tobacco: Never Used  Substance Use Topics  . Alcohol use: Yes    Alcohol/week: 7.2 oz    Types: 12 Cans of beer per week    Comment: 12 beers per week per pt.   . Drug use: No     Allergies   Penicillins   Review of Systems Review of Systems  All systems reviewed and negative, other than as noted in HPI.  Physical Exam Updated Vital Signs BP (!) 151/99 (BP Location: Left Arm)   Pulse 95   Temp 99.1 F (37.3 C) (Oral)   Resp 18   SpO2  97%   Physical Exam  Constitutional: He appears well-developed and well-nourished. No distress.  Sitting in bed. Nontoxic.   HENT:  Head: Normocephalic and atraumatic.  Eyes: Conjunctivae are normal. Right eye exhibits no discharge. Left eye exhibits no discharge.  Neck: Neck supple.  Cardiovascular: Normal rate, regular rhythm and normal heart sounds. Exam reveals no gallop and no friction rub.  No murmur heard. Pulmonary/Chest: Effort normal and breath sounds normal. No respiratory distress.  Abdominal: Soft. He exhibits no distension. There is no tenderness.  Musculoskeletal: He exhibits no edema or tenderness.  Neurological: He is alert.  Skin: Skin is warm and dry.  Psychiatric: He has a  normal mood and affect. His behavior is normal. Thought content normal.  Nursing note and vitals reviewed.    ED Treatments / Results  Labs (all labs ordered are listed, but only abnormal results are displayed) Labs Reviewed - No data to display  EKG  EKG Interpretation None       Radiology No results found.  Procedures Procedures (including critical care time)  Medications Ordered in ED Medications - No data to display   Initial Impression / Assessment and Plan / ED Course  I have reviewed the triage vital signs and the nursing notes.  Pertinent labs & imaging results that were available during my care of the patient were reviewed by me and considered in my medical decision making (see chart for details).     67 year old male with flulike symptoms.  He is nontoxic.  No hypotension.  His lungs sound clear on exam.  I suspect he has influenza.  I do not feel that he needs further w/u. I do not think symptoms are directly related to recent colonoscopy. Discussed tamiflu as an option but not something I would strongly recommend. He is electing for sym  Final Clinical Impressions(s) / ED Diagnoses   Final diagnoses:  Influenza-like illness    ED Discharge Orders    None       Virgel Manifold, MD 01/31/18 [phone]

## 2018-02-03 ENCOUNTER — Ambulatory Visit (INDEPENDENT_AMBULATORY_CARE_PROVIDER_SITE_OTHER): Payer: Medicare Other | Admitting: Orthopaedic Surgery

## 2018-02-12 ENCOUNTER — Telehealth: Payer: Self-pay | Admitting: Gastroenterology

## 2018-02-12 NOTE — Telephone Encounter (Signed)
Spoke to patient, went over letter he received about results. Answered all his questions, he understands that he will be on a recall for repeat colonoscopy in 5 years.

## 2018-02-13 ENCOUNTER — Ambulatory Visit (INDEPENDENT_AMBULATORY_CARE_PROVIDER_SITE_OTHER): Payer: Medicare Other | Admitting: Orthopaedic Surgery

## 2018-02-17 ENCOUNTER — Encounter (INDEPENDENT_AMBULATORY_CARE_PROVIDER_SITE_OTHER): Payer: Self-pay | Admitting: Orthopaedic Surgery

## 2018-02-17 ENCOUNTER — Ambulatory Visit (INDEPENDENT_AMBULATORY_CARE_PROVIDER_SITE_OTHER): Payer: Medicare Other

## 2018-02-17 ENCOUNTER — Ambulatory Visit (INDEPENDENT_AMBULATORY_CARE_PROVIDER_SITE_OTHER): Payer: Medicare Other | Admitting: Orthopaedic Surgery

## 2018-02-17 DIAGNOSIS — Z96641 Presence of right artificial hip joint: Secondary | ICD-10-CM

## 2018-02-17 NOTE — Progress Notes (Signed)
Office Visit Note   Patient: Jared Rhodes.           Date of Birth: [birth year]           MRN: [id number] Visit Date: 02/17/2018              Requested by: Velna Hatchet, MD [place],  [id number] PCP: Velna Hatchet, MD   Assessment & Plan: Visit Diagnoses:  1. History of total hip arthroplasty, right     Plan: In regards to the hip, we are pleased he is doing so well.  Continue work on home exercise program.  Follow-up with [place] in 2 years for recheck and x-ray.  In regards to the ankle he is doing well there also.  If his pain returns she will follow-up with [place] early may entertain injecting his tib-fib joint.  He will call with questions or concerns in the meantime.  Follow-Up Instructions: Return in about 2 years (around 02/18/2020).   Orders:  Orders Placed This Encounter  Procedures  . XR HIP UNILAT W OR W/O PELVIS 2-3 VIEWS RIGHT   No orders of the defined types were placed in this encounter.     Procedures: No procedures performed   Clinical Data: No additional findings.   Subjective: Chief Complaint  Patient presents with  . Right Hip - Follow-up    HPI Braxson comes in for follow-up.  2 years out from a right anterior total hip replacement, date of surgery 03/02/2016.  Doing excellent.  No pain and full range of motion and strength.  He is also here for a six-month recheck of his left ankle.  History of osteoarthritis to the distal tib-fib joint.  He was initially placed in a cam walker with relative rest and put on naproxen.  This significantly helped.  He has been out of his Cam walker into regular shoe for a while now and doing well.  Still taking naproxen as needed.  Overall doing great without complaints.  Review of Systems as detailed in HPI.  All others are negative.   Objective: Vital Signs: There were no vitals taken for this visit.  Physical Exam well-developed well-nourished gentleman in no acute distress.  Alert  and oriented x3.  Ortho Exam examination of the right hip reveals full hip flexion with and without resistance.  Negative logroll.  Left ankle reveals full painless range of motion.  He is rest intact distally.  Specialty Comments:  No specialty comments available.  Imaging: Xr Hip Unilat W Or W/o Pelvis 2-3 Views Right  Result Date: 02/17/2018 X-rays of the right hip reveal a well-seated prosthesis    PMFS History: Patient Active Problem List   Diagnosis Date Noted  . Prostate cancer ([place]) 02/01/2017  . Osteoarthritis of right hip 03/02/2016  . Hip joint replacement status 03/02/2016  . Alcohol intoxication ([place]) 07/17/2014  . Type III open fracture dislocation of left ankle joint 07/17/2014  . HTN (hypertension) 04/17/2014  . Severe obesity (BMI >= 40) ([place]) 04/17/2014   Past Medical History:  Diagnosis Date  . Hypertension   . Prostate cancer ([place]) 2018   radation 40 treatments     Family History  Problem Relation Age of Onset  . Alcohol abuse Mother   . Hyperlipidemia Brother   . Heart disease Sister   . Colon cancer Neg Hx     Past Surgical History:  Procedure Laterality Date  . I&D EXTREMITY Left 07/17/2014   Procedure: IRRIGATION  AND DEBRIDEMENT EXTREMITY;  Surgeon: Marianna Payment, MD;  Location: [facility];  Service: Orthopedics;  Laterality: Left;  . I&D EXTREMITY Left 07/19/2014   Procedure: IRRIGATION AND DEBRIDEMENT EXTREMITY;  Surgeon: Marianna Payment, MD;  Location: [facility];  Service: Orthopedics;  Laterality: Left;  . ORIF ANKLE FRACTURE Left 07/19/2014   Procedure: OPEN REDUCTION INTERNAL FIXATION (ORIF) ANKLE FRACTURE;  Surgeon: Marianna Payment, MD;  Location: [facility];  Service: Orthopedics;  Laterality: Left;  . PROSTATE BIOPSY    . TONSILLECTOMY    . TOTAL HIP ARTHROPLASTY Right 03/02/2016   Procedure: RIGHT TOTAL HIP ARTHROPLASTY ANTERIOR APPROACH;  Surgeon: Leandrew Koyanagi, MD;  Location: [facility];  Service: Orthopedics;  Laterality: Right;   Social  History   Occupational History  . 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 Occupation: truck Geophysicist/field seismologist  Tobacco Use  . Smoking status: Current Some Day Smoker    Packs/day: 0.50    Years: 30.00    Pack years: 15.00    Types: Cigarettes  . Smokeless tobacco: Never Used  Substance and Sexual Activity  . Alcohol use: Yes    Alcohol/week: 7.2 oz    Types: 12 Cans of beer per week    Comment: 12 beers per week per pt.   . Drug use: No  . Sexual activity: Yes    Partners: Female

## 2018-09-19 ENCOUNTER — Other Ambulatory Visit (INDEPENDENT_AMBULATORY_CARE_PROVIDER_SITE_OTHER): Payer: Self-pay

## 2018-09-19 ENCOUNTER — Telehealth (INDEPENDENT_AMBULATORY_CARE_PROVIDER_SITE_OTHER): Payer: Self-pay | Admitting: Orthopaedic Surgery

## 2018-09-19 MED ORDER — NAPROXEN 500 MG PO TABS: ORAL_TABLET | ORAL | 0 refills | Status: DC

## 2018-09-19 NOTE — Telephone Encounter (Signed)
Ok to do

## 2018-09-19 NOTE — Telephone Encounter (Signed)
See message below °

## 2018-09-19 NOTE — Telephone Encounter (Signed)
Patient requesting rx refill on naproxen for his arthritis, please call into [facility] on [address] Patients # [phone]

## 2018-09-19 NOTE — Telephone Encounter (Signed)
Sent in to pharm.

## 2018-11-18 ENCOUNTER — Encounter (HOSPITAL_COMMUNITY): Payer: Self-pay

## 2018-11-18 ENCOUNTER — Emergency Department (HOSPITAL_COMMUNITY): Payer: Medicare Other

## 2018-11-18 ENCOUNTER — Emergency Department (HOSPITAL_COMMUNITY)
Admission: EM | Admit: 2018-11-18 | Discharge: 2018-11-18 | Disposition: A | Discharge status: Home / Self Care | Payer: Medicare Other | Attending: Emergency Medicine | Admitting: Emergency Medicine

## 2018-11-18 DIAGNOSIS — F17210 Nicotine dependence, cigarettes, uncomplicated: Secondary | ICD-10-CM | POA: Insufficient documentation

## 2018-11-18 DIAGNOSIS — Z8546 Personal history of malignant neoplasm of prostate: Secondary | ICD-10-CM | POA: Diagnosis not present

## 2018-11-18 DIAGNOSIS — R1084 Generalized abdominal pain: Secondary | ICD-10-CM | POA: Insufficient documentation

## 2018-11-18 DIAGNOSIS — Z79899 Other long term (current) drug therapy: Secondary | ICD-10-CM | POA: Diagnosis not present

## 2018-11-18 DIAGNOSIS — I10 Essential (primary) hypertension: Secondary | ICD-10-CM | POA: Insufficient documentation

## 2018-11-18 DIAGNOSIS — R109 Unspecified abdominal pain: Secondary | ICD-10-CM

## 2018-11-18 LAB — URINALYSIS, ROUTINE W REFLEX MICROSCOPIC
Bilirubin Urine: NEGATIVE
GLUCOSE, UA: NEGATIVE mg/dL
Hgb urine dipstick: NEGATIVE
KETONES UR: 5 mg/dL — AB
LEUKOCYTES UA: NEGATIVE
Nitrite: NEGATIVE
PH: 6 (ref 5.0–8.0)
Protein, ur: NEGATIVE mg/dL
SPECIFIC GRAVITY, URINE: 1.015 (ref 1.005–1.030)

## 2018-11-18 LAB — CBC
HEMATOCRIT: 38.2 % — AB (ref 39.0–52.0)
Hemoglobin: 12.4 g/dL — ABNORMAL LOW (ref 13.0–17.0)
MCH: 30 pg (ref 26.0–34.0)
MCHC: 32.5 g/dL (ref 30.0–36.0)
MCV: 92.5 fL (ref 80.0–100.0)
NRBC: 0 % (ref 0.0–0.2)
Platelets: 220 10*3/uL (ref 150–400)
RBC: 4.13 MIL/uL — ABNORMAL LOW (ref 4.22–5.81)
RDW: 14.6 % (ref 11.5–15.5)
WBC: 5.3 10*3/uL (ref 4.0–10.5)

## 2018-11-18 LAB — COMPREHENSIVE METABOLIC PANEL
ALT: 16 U/L (ref 0–44)
AST: 19 U/L (ref 15–41)
Albumin: 3.7 g/dL (ref 3.5–5.0)
Alkaline Phosphatase: 40 U/L (ref 38–126)
Anion gap: 7 (ref 5–15)
BUN: 14 mg/dL (ref 8–23)
CHLORIDE: 108 mmol/L (ref 98–111)
CO2: 28 mmol/L (ref 22–32)
CREATININE: 0.77 mg/dL (ref 0.61–1.24)
Calcium: 8.9 mg/dL (ref 8.9–10.3)
GFR calc non Af Amer: >60 mL/min (ref >60)
Glucose, Bld: 91 mg/dL (ref 70–99)
Potassium: 4 mmol/L (ref 3.5–5.1)
SODIUM: 143 mmol/L (ref 135–145)
Total Bilirubin: 0.5 mg/dL (ref 0.3–1.2)
Total Protein: 7.6 g/dL (ref 6.5–8.1)

## 2018-11-18 LAB — LIPASE, BLOOD: LIPASE: 32 U/L (ref 11–51)

## 2018-11-18 MED ORDER — LIDOCAINE 5 % EX PTCH
1.0000 | MEDICATED_PATCH | CUTANEOUS | Status: DC
  Administered 2018-11-18: 1 via TRANSDERMAL
  Filled 2018-11-18: qty 1

## 2018-11-18 MED ORDER — SODIUM CHLORIDE (PF) 0.9 % IJ SOLN
INTRAMUSCULAR | Status: AC
  Filled 2018-11-18: qty 50

## 2018-11-18 MED ORDER — IOPAMIDOL (ISOVUE-300) INJECTION 61%
INTRAVENOUS | Status: AC
  Filled 2018-11-18: qty 100

## 2018-11-18 MED ORDER — IOPAMIDOL (ISOVUE-300) INJECTION 61%
100.0000 mL | Freq: Once | INTRAVENOUS | Status: AC | PRN
  Administered 2018-11-18: 100 mL via INTRAVENOUS

## 2018-11-18 MED ORDER — MORPHINE SULFATE (PF) 4 MG/ML IV SOLN: 4.0000 mg | Freq: Once | INTRAVENOUS | Status: DC

## 2018-11-18 MED ORDER — ACETAMINOPHEN 500 MG PO TABS
1000.0000 mg | ORAL_TABLET | Freq: Once | ORAL | Status: AC
  Administered 2018-11-18: 1000 mg via ORAL
  Filled 2018-11-18: qty 2

## 2018-11-18 MED ORDER — SODIUM CHLORIDE 0.9 % IV BOLUS
1000.0000 mL | Freq: Once | INTRAVENOUS | Status: AC
  Administered 2018-11-18: 1000 mL via INTRAVENOUS

## 2018-11-18 MED ORDER — ONDANSETRON HCL 4 MG/2ML IJ SOLN
4.0000 mg | Freq: Once | INTRAMUSCULAR | Status: DC
  Filled 2018-11-18: qty 2

## 2018-11-18 NOTE — ED Notes (Signed)
Pt provided with sandwich.

## 2018-11-18 NOTE — ED Provider Notes (Signed)
[facility] DEPT Provider Note   CSN: [id number] Arrival date & time: 11/18/18  1113     History   Chief Complaint Chief Complaint  Patient presents with  . Abdominal Pain    HPI Jared Rhodes. is a 67 y.o. male.  [facility]. is a 67 y.o. Male history of hypertension, prostate cancer, and osteoarthritis of the hip, who presents to the emergency department for evaluation of left flank and left lower abdominal pain.  Pain started about 3 days ago while at work and since then has steadily worsened.  He reports pain is a dull constant ache in the left flank and left lower quadrant of the and.  He denies any associated fevers or chills.  No nausea or vomiting.  No diarrhea, constipation, melena or hematochezia.  He denies any dysuria, urinary frequency or hematuria.  No pain radiating into the testicles or testicular swelling.  No pain in his chest or shortness of breath, no coughing he has not taken anything to try and treat this pain denies any other alleviating factors but just reports he is concerned about what could possibly be causing it.     Past Medical History:  Diagnosis Date  . Hypertension   . Prostate cancer ([place]) 2018   radation 40 treatments     Patient Active Problem List   Diagnosis Date Noted  . Prostate cancer ([place]) 02/01/2017  . Osteoarthritis of right hip 03/02/2016  . Hip joint replacement status 03/02/2016  . Alcohol intoxication ([place]) 07/17/2014  . Type III open fracture dislocation of left ankle joint 07/17/2014  . HTN (hypertension) 04/17/2014  . Severe obesity (BMI >= 40) ([place]) 04/17/2014    Past Surgical History:  Procedure Laterality Date  . I&D EXTREMITY Left 07/17/2014   Procedure: IRRIGATION AND DEBRIDEMENT EXTREMITY;  Surgeon: Marianna Payment, MD;  Location: [facility];  Service: Orthopedics;  Laterality: Left;  . I&D EXTREMITY Left 07/19/2014   Procedure: IRRIGATION AND DEBRIDEMENT EXTREMITY;   Surgeon: Marianna Payment, MD;  Location: [place];  Service: Orthopedics;  Laterality: Left;  . ORIF ANKLE FRACTURE Left 07/19/2014   Procedure: OPEN REDUCTION INTERNAL FIXATION (ORIF) ANKLE FRACTURE;  Surgeon: Marianna Payment, MD;  Location: [facility];  Service: Orthopedics;  Laterality: Left;  . PROSTATE BIOPSY    . TONSILLECTOMY    . TOTAL HIP ARTHROPLASTY Right 03/02/2016   Procedure: RIGHT TOTAL HIP ARTHROPLASTY ANTERIOR APPROACH;  Surgeon: Leandrew Koyanagi, MD;  Location: [facility];  Service: Orthopedics;  Laterality: Right;        Home Medications    Prior to Admission medications   Medication Sig Start Date End Date Taking? Authorizing Provider  losartan (COZAAR) 50 MG tablet Take 50 mg by mouth daily.  05/02/17  Yes [provider]  Multiple Vitamins-Minerals (CENTRUM SILVER PO) Take 1 tablet by mouth daily.   Yes [provider]  naproxen (NAPROSYN) 500 MG tablet TAKE 1 TABLET BY MOUTH TWICE DAILY WITH A MEAL Patient not taking: Reported on 11/18/2018 09/19/18   Leandrew Koyanagi, MD    Family History Family History  Problem Relation Age of Onset  . Alcohol abuse Mother   . Hyperlipidemia Brother   . Heart disease Sister   . Colon cancer Neg Hx     Social History Social History   Tobacco Use  . Smoking status: Current Some Day Smoker    Packs/day: 0.50    Years: 30.00    Pack years: 15.00  Types: Cigarettes  . Smokeless tobacco: Never Used  Substance Use Topics  . Alcohol use: Yes    Alcohol/week: 12.0 standard drinks    Types: 12 Cans of beer per week    Comment: 12 beers per week per pt.   . Drug use: No     Allergies   Penicillins   Review of Systems Review of Systems  Constitutional: Negative for chills, fatigue and fever.  HENT: Negative.   Respiratory: Negative for cough and shortness of breath.   Cardiovascular: Negative for chest pain.  Gastrointestinal: Positive for abdominal pain. Negative for abdominal distention, blood in stool,  constipation, diarrhea, nausea and vomiting.  Genitourinary: Positive for flank pain. Negative for difficulty urinating, dysuria, frequency, hematuria, scrotal swelling and testicular pain.  Musculoskeletal: Negative for arthralgias, back pain and myalgias.  Skin: Negative for color change and rash.  Neurological: Negative for dizziness, weakness, light-headedness and numbness.     Physical Exam Updated Vital Signs BP (!) 143/84 (BP Location: Left Arm)   Pulse 84   Temp 99 F (37.2 C) (Oral)   Resp 18   Ht 5' 8.5" (1.74 m)   Wt 99.8 kg   SpO2 98%   BMI 32.96 kg/m   Physical Exam  Constitutional: He is oriented to person, place, and time. He appears well-developed and well-nourished. He does not appear ill. No distress.  HENT:  Head: Normocephalic and atraumatic.  Mouth/Throat: Oropharynx is clear and moist.  Eyes: Right eye exhibits no discharge. Left eye exhibits no discharge.  Cardiovascular: Normal rate, regular rhythm, normal heart sounds and intact distal pulses. Exam reveals no gallop and no friction rub.  No murmur heard. Pulmonary/Chest: Effort normal and breath sounds normal. No respiratory distress.  Respirations equal and unlabored, patient able to speak in full sentences, lungs clear to auscultation bilaterally  Abdominal: Soft. Normal appearance and bowel sounds are normal. He exhibits no distension. There is tenderness in the left lower quadrant. There is guarding. There is no rigidity, no rebound and no CVA tenderness.  Abdomen is soft, nondistended, bowel sounds present throughout, there is focal tenderness in the left lower quadrant and tenderness along the left flank with no palpable deformity or mass, mild guarding, no focal CVA tenderness.  No overlying skin changes.  Genitourinary: Testes normal and penis normal. Right testis shows no mass and no tenderness. Left testis shows no mass and no tenderness.  Neurological: He is alert and oriented to person, place,  and time. Coordination normal.  Skin: Skin is warm and dry. He is not diaphoretic.  Psychiatric: He has a normal mood and affect. His behavior is normal.  Nursing note and vitals reviewed.    ED Treatments / Results  Labs (all labs ordered are listed, but only abnormal results are displayed) Labs Reviewed  CBC - Abnormal; Notable for the following components:      Result Value   RBC 4.13 (*)    Hemoglobin 12.4 (*)    HCT 38.2 (*)    All other components within normal limits  URINALYSIS, ROUTINE W REFLEX MICROSCOPIC - Abnormal; Notable for the following components:   Ketones, ur 5 (*)    All other components within normal limits  LIPASE, BLOOD  COMPREHENSIVE METABOLIC PANEL    EKG None  Radiology Ct Abdomen Pelvis W Contrast  Result Date: 11/18/2018 CLINICAL DATA:  Abdominal pain. EXAM: CT ABDOMEN AND PELVIS WITH CONTRAST TECHNIQUE: Multidetector CT imaging of the abdomen and pelvis was performed using the standard protocol  following bolus administration of intravenous contrast. CONTRAST:  125mL ISOVUE-300 IOPAMIDOL (ISOVUE-300) INJECTION 61% COMPARISON:  CT scan of January 14, 2017. FINDINGS: Lower chest: No acute abnormality. Hepatobiliary: No focal liver abnormality is seen. No gallstones, gallbladder wall thickening, or biliary dilatation. Pancreas: Unremarkable. No pancreatic ductal dilatation or surrounding inflammatory changes. Spleen: Normal in size without focal abnormality. Adrenals/Urinary Tract: Adrenal glands are unremarkable. Kidneys are normal, without renal calculi, focal lesion, or hydronephrosis. Bladder is unremarkable. Stomach/Bowel: The stomach appears normal. There is no evidence of bowel obstruction or inflammation. The appendix is not visualized. Vascular/Lymphatic: No significant vascular findings are present. No enlarged abdominal or pelvic lymph nodes. Reproductive: Stable mild prostatic enlargement is noted. Other: No abdominal wall hernia or abnormality. No  abdominopelvic ascites. Musculoskeletal: Status post right hip arthroplasty. Old compression fractures of T12, L2 and L4 are noted. No acute abnormality is noted. IMPRESSION: No acute abnormality is noted in the abdomen or pelvis. Stable mild prostatic enlargement is noted. Old compression fractures are noted in the lower thoracic and lumbar spine. Electronically Signed   By: Marijo Conception, M.D.   On: 11/18/2018 15:48    Procedures Procedures (including critical care time)  Medications Ordered in ED Medications  iopamidol (ISOVUE-300) 61 % injection (has no administration in time range)  sodium chloride (PF) 0.9 % injection (has no administration in time range)  lidocaine (LIDODERM) 5 % 1 patch (1 patch Transdermal Patch Applied 11/18/18 1624)  sodium chloride 0.9 % bolus 1,000 mL (0 mLs Intravenous Stopped 11/18/18 1511)  acetaminophen (TYLENOL) tablet 1,000 mg (1,000 mg Oral Given 11/18/18 1331)  iopamidol (ISOVUE-300) 61 % injection 100 mL (100 mLs Intravenous Contrast Given 11/18/18 1516)     Initial Impression / Assessment and Plan / ED Course  I have reviewed the triage vital signs and the nursing notes.  Pertinent labs & imaging results that were available during my care of the patient were reviewed by me and considered in my medical decision making (see chart for details).  Patient presents with 3 days of left flank and left lower quadrant abdominal pain.  No associated fevers, vomiting, diarrhea or blood in your stool.  On arrival patient is mildly hypertensive but vitals are otherwise stable.  He denies any associated chest pain or shortness of breath.  On exam lungs are clear.  Patient is focally tender over the left flank elevated but no CVA tenderness, and patient has some tenderness with mild guarding in the left lower quadrant of the abdomen, abdominal exam is otherwise benign.  Concern for diverticulitis, kidney stone, or possible mass given patient's history of prostate  cancer.  Pain could also be musculoskeletal in nature.  Will get abdominal labs and CT abdomen pelvis.  Offered pain medication, but patient declined requesting something along that make him drowsy, will give Tylenol.  Labs reassuring, no leukocytosis, stable hemoglobin, no acute electrolyte derangements, normal renal and liver function, normal lipase and urinalysis without signs of infection and no hematuria.   CT abdomen pelvis shows no acute findings, does show some old compression fractures within the lower thoracic and lumbar vertebrae.  Given reassuring evaluation today did not see any acute or investigations pain, likely musculoskeletal in nature.  On reevaluation patient does not have any signs of surgical abdomen.  Patient's pain is improved with treatment here in the ED.  Patient continue with Tylenol, lidocaine patches, ice and have him follow-up with his primary care doctor if symptoms persist.  Strict return precautions discussed.  Patient expresses understanding and is in agreement with plan.  Stable for discharge home.  Final Clinical Impressions(s) / ED Diagnoses   Final diagnoses:  Left flank pain    ED Discharge Orders    None       Janet Berlin 11/20/18 2308    Veryl Speak, MD 11/21/18 [phone]

## 2018-11-18 NOTE — ED Notes (Signed)
Pt transported to CT at this time.

## 2018-11-18 NOTE — ED Notes (Signed)
Patient made aware of need of urine sample. Patient provided with urinal

## 2018-11-18 NOTE — Discharge Instructions (Signed)
Work-up today is very reassuring and labs and CT scan overall look good and do not show any problem with your kidneys or bowels.  Your CT scan does show multiple old compression fractures in your back, and I wonder if your pain is more musculoskeletal.  You can use Tylenol 1000 mg every 6 hours, and salon pas lidocane patches (blue and silver box) as needed for pain and I would like for you to follow-up with your primary care doctor regarding this.  Return to the emergency department if you have new or worsening abdominal pain, vomiting, blood in your stool, fevers or any other new or concerning symptoms.

## 2018-11-18 NOTE — ED Notes (Signed)
ED Provider at bedside. 

## 2018-11-18 NOTE — ED Triage Notes (Signed)
Pt presents with c/o left lower abdominal pain that started several days ago at work. Pt denies any N/V/D.

## 2018-11-18 NOTE — ED Notes (Signed)
Pt requesting "Pain medication that will not make him drowsy" PA made aware. Verbal order received.

## 2018-11-18 NOTE — ED Notes (Signed)
Blood drawn RAC. Tolerated well.

## 2018-11-18 NOTE — ED Notes (Signed)
Pt requesting something to eat. [place] PA to bedside to inform pt that he remains NPO until results are back

## 2019-01-27 ENCOUNTER — Encounter (INDEPENDENT_AMBULATORY_CARE_PROVIDER_SITE_OTHER): Payer: Self-pay | Admitting: Orthopaedic Surgery

## 2019-01-27 ENCOUNTER — Ambulatory Visit (INDEPENDENT_AMBULATORY_CARE_PROVIDER_SITE_OTHER): Payer: Medicare Other

## 2019-01-27 ENCOUNTER — Ambulatory Visit (INDEPENDENT_AMBULATORY_CARE_PROVIDER_SITE_OTHER): Payer: Medicare Other | Admitting: Orthopaedic Surgery

## 2019-01-27 VITALS — Ht 68.0 in | Wt 220.0 lb

## 2019-01-27 DIAGNOSIS — M25572 Pain in left ankle and joints of left foot: Secondary | ICD-10-CM | POA: Diagnosis not present

## 2019-01-27 DIAGNOSIS — Z96641 Presence of right artificial hip joint: Secondary | ICD-10-CM

## 2019-01-27 MED ORDER — MELOXICAM 7.5 MG PO TABS: 7.5000 mg | ORAL_TABLET | Freq: Every day | ORAL | 2 refills | Status: DC | PRN

## 2019-01-27 MED ORDER — MELOXICAM 7.5 MG PO TABS: 7.5000 mg | ORAL_TABLET | Freq: Two times a day (BID) | ORAL | 6 refills | Status: DC | PRN

## 2019-01-27 NOTE — Progress Notes (Signed)
Office Visit Note   Patient: Jared Rhodes.           Date of Birth: Jun 20, 1951           MRN: [id number] Visit Date: 01/27/2019              Requested by: Velna Hatchet, MD [address] [place], [place] [address] PCP: Velna Hatchet, MD   Assessment & Plan: Visit Diagnoses:  1. Pain in left ankle and joints of left foot   2. History of total hip arthroplasty, right     Plan: Impression is 3-year status post right total hip replacement and left ankle posttraumatic arthritis.  For the hip he is doing well I would like to see him back in 2 years for 5-year follow-up with standing AP pelvis and lateral right hip on return.  For his ankle I believe he has posttraumatic arthritis I did offer cortisone injection but he would like to try meloxicam first.  We will see how he responds to this.  Questions encouraged and answered.  Follow-up as needed.  Follow-Up Instructions: Return in about 2 years (around 01/27/2021).   Orders:  Orders Placed This Encounter  Procedures  . XR Ankle Complete Left  . XR HIP UNILAT W OR W/O PELVIS 2-3 VIEWS RIGHT   Meds ordered this encounter  Medications  . DISCONTD: meloxicam (MOBIC) 7.5 MG tablet    Sig: Take 1 tablet (7.5 mg total) by mouth 2 (two) times daily as needed for pain.    Dispense:  60 tablet    Refill:  6  . meloxicam (MOBIC) 7.5 MG tablet    Sig: Take 1 tablet (7.5 mg total) by mouth daily as needed for pain.    Dispense:  30 tablet    Refill:  2      Procedures: No procedures performed   Clinical Data: No additional findings.   Subjective: Chief Complaint  Patient presents with  . Left Ankle - Pain    Jared Rhodes comes in today for medial left ankle pain.  He is also 3 years status post a right total hip replacement.  He is doing well from the right hip and reports no complaints.  He has lost a fair amount of weight in order to get healthier and he looks really good.  In terms of his left ankle he reports increased  discomfort with prolonged standing and walking on the medial side.  He denies any swelling.  He is status post ORIF of an open fracture dislocation of the left ankle in 2015 that I treated.   Review of Systems  Constitutional: Negative.   All other systems reviewed and are negative.    Objective: Vital Signs: Ht 5\' 8"  (1.727 m)   Wt 220 lb (99.8 kg)   BMI 33.45 kg/m   Physical Exam Vitals signs and nursing note reviewed.  Constitutional:      Appearance: He is well-developed.  Pulmonary:     Effort: Pulmonary effort is normal.  Abdominal:     Palpations: Abdomen is soft.  Skin:    General: Skin is warm.  Neurological:     Mental Status: He is alert and oriented to person, place, and time.  Psychiatric:        Behavior: Behavior normal.        Thought Content: Thought content normal.        Judgment: Judgment normal.     Ortho Exam Right hip exam is benign.  Surgical scar  is fully healed. Left ankle exam shows a healed posttraumatic scar on the medial side.  He does not have any significant tenderness along the posterior tibial tendon.  Pain is more deep-seated on the medial side.  He does have crepitus with ankle range of motion. Specialty Comments:  No specialty comments available.  Imaging: Xr Ankle Complete Left  Result Date: 01/27/2019 Medial ankle joint spurring consistent with posttraumatic arthritis.  Calcified syndesmosis.  Stable hardware.  Xr Hip Unilat W Or W/o Pelvis 2-3 Views Right  Result Date: 01/27/2019 Stable total hip replacement without complication. nonbridging HO formation of the abductors.    PMFS History: Patient Active Problem List   Diagnosis Date Noted  . Prostate cancer ([place]) 02/01/2017  . Osteoarthritis of right hip 03/02/2016  . Hip joint replacement status 03/02/2016  . Alcohol intoxication ([place]) 07/17/2014  . Type III open fracture dislocation of left ankle joint 07/17/2014  . HTN (hypertension) 04/17/2014  . Severe obesity (BMI  >= 40) ([place]) 04/17/2014   Past Medical History:  Diagnosis Date  . Hypertension   . Prostate cancer ([place]) 2018   radation 40 treatments     Family History  Problem Relation Age of Onset  . Alcohol abuse Mother   . Hyperlipidemia Brother   . Heart disease Sister   . Colon cancer Neg Hx     Past Surgical History:  Procedure Laterality Date  . I&D EXTREMITY Left 07/17/2014   Procedure: IRRIGATION AND DEBRIDEMENT EXTREMITY;  Surgeon: Marianna Payment, MD;  Location: [facility];  Service: Orthopedics;  Laterality: Left;  . I&D EXTREMITY Left 07/19/2014   Procedure: IRRIGATION AND DEBRIDEMENT EXTREMITY;  Surgeon: Marianna Payment, MD;  Location: [facility];  Service: Orthopedics;  Laterality: Left;  . ORIF ANKLE FRACTURE Left 07/19/2014   Procedure: OPEN REDUCTION INTERNAL FIXATION (ORIF) ANKLE FRACTURE;  Surgeon: Marianna Payment, MD;  Location: [facility];  Service: Orthopedics;  Laterality: Left;  . PROSTATE BIOPSY    . TONSILLECTOMY    . TOTAL HIP ARTHROPLASTY Right 03/02/2016   Procedure: RIGHT TOTAL HIP ARTHROPLASTY ANTERIOR APPROACH;  Surgeon: Leandrew Koyanagi, MD;  Location: [facility];  Service: Orthopedics;  Laterality: Right;   Social History   Occupational History  . 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 Occupation: truck Geophysicist/field seismologist  Tobacco Use  . Smoking status: Current Some Day Smoker    Packs/day: 0.50    Years: 30.00    Pack years: 15.00    Types: Cigarettes  . Smokeless tobacco: Never Used  Substance and Sexual Activity  . Alcohol use: Yes    Alcohol/week: 12.0 standard drinks    Types: 12 Cans of beer per week    Comment: 12 beers per week per pt.   . Drug use: No  . Sexual activity: Yes    Partners: Female

## 2019-04-29 ENCOUNTER — Telehealth: Payer: Self-pay | Admitting: Orthopaedic Surgery

## 2019-04-29 ENCOUNTER — Other Ambulatory Visit (INDEPENDENT_AMBULATORY_CARE_PROVIDER_SITE_OTHER): Payer: Self-pay | Admitting: Physician Assistant

## 2019-04-29 NOTE — Telephone Encounter (Signed)
Called patient to ask what Rx he was referring to. He is at work and gets off late. He will call us tomorrow to let us know which medicine.

## 2019-04-29 NOTE — Telephone Encounter (Signed)
Pt called saying his RX needs to go to the [facility] on [address].

## 2019-06-30 ENCOUNTER — Ambulatory Visit (INDEPENDENT_AMBULATORY_CARE_PROVIDER_SITE_OTHER): Payer: Medicare Other | Admitting: Orthopaedic Surgery

## 2019-06-30 ENCOUNTER — Other Ambulatory Visit: Payer: Self-pay

## 2019-06-30 ENCOUNTER — Encounter: Payer: Self-pay | Admitting: Orthopaedic Surgery

## 2019-06-30 DIAGNOSIS — M25571 Pain in right ankle and joints of right foot: Secondary | ICD-10-CM

## 2019-06-30 NOTE — Progress Notes (Signed)
Office Visit Note   Patient: Jared Rhodes.           Date of Birth: 01-26-1951           MRN: [id number] Visit Date: 06/30/2019              Requested by: Velna Hatchet, MD [address] [place],  [place] [address] PCP: Velna Hatchet, MD   Assessment & Plan: Visit Diagnoses:  1. Pain in right ankle and joints of right foot     Plan: Impression is right ankle sprain versus occult fibula fracture.  Clinically speaking he is doing quite well and I would recommend that he wear an ASO brace for support.  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  He knows to give [place] a call if he does not feel any improvement over the next month or so.  I think the suspicion for fracture is low.  I am fine with him just following up as needed.  He will call us if he needs [place].  Follow-Up Instructions: Return if symptoms worsen or fail to improve.   Orders:  No orders of the defined types were placed in this encounter.  No orders of the defined types were placed in this encounter.     Procedures: No procedures performed   Clinical Data: No additional findings.   Subjective: Chief Complaint  Patient presents with  . Right Ankle - Pain    Jared Rhodes is a 68 year old gentleman who is well-known to me who comes in for evaluation of a recent right ankle injury.  He missed a step and twisted his ankle.  He presented to the orthopedic urgent care over the weekend and x-rays were obtained and diagnosed with a hairline fracture of the fibula per the patient.  He states that he is feeling better and walking better now.  He does have some residual swelling on the lateral aspect of his ankle.  Denies any numbness and tingling.   Review of Systems  Constitutional: Negative.   All other systems reviewed and are negative.    Objective: Vital Signs: There were no vitals taken for this visit.  Physical Exam Vitals signs and nursing note reviewed.   Constitutional:      Appearance: He is well-developed.  Pulmonary:     Effort: Pulmonary effort is normal.  Abdominal:     Palpations: Abdomen is soft.  Skin:    General: Skin is warm.  Neurological:     Mental Status: He is alert and oriented to person, place, and time.  Psychiatric:        Behavior: Behavior normal.        Thought Content: Thought content normal.        Judgment: Judgment normal.     Ortho Exam Right ankle exam shows moderate swelling of the lateral aspect.  He has good range of motion.  He has mild tenderness of the lateral ankle ligaments.  He is slightly more discomfort with palpation along the fibular shaft.  He has good strength. Specialty Comments:  No specialty comments available.  Imaging: No results found.   PMFS History: Patient Active Problem List   Diagnosis Date Noted  . Prostate cancer ([place]) 02/01/2017  . Osteoarthritis of right hip 03/02/2016  . Hip joint replacement status 03/02/2016  . Alcohol intoxication ([place]) 07/17/2014  . Type III open fracture dislocation of left ankle joint 07/17/2014  . HTN (hypertension)  04/17/2014  . Severe obesity (BMI >= 40) ([place]) 04/17/2014   Past Medical History:  Diagnosis Date  . Hypertension   . Prostate cancer ([place]) 2018   radation 40 treatments     Family History  Problem Relation Age of Onset  . Alcohol abuse Mother   . Hyperlipidemia Brother   . Heart disease Sister   . Colon cancer Neg Hx     Past Surgical History:  Procedure Laterality Date  . I&D EXTREMITY Left 07/17/2014   Procedure: IRRIGATION AND DEBRIDEMENT EXTREMITY;  Surgeon: Marianna Payment, MD;  Location: [facility];  Service: Orthopedics;  Laterality: Left;  . I&D EXTREMITY Left 07/19/2014   Procedure: IRRIGATION AND DEBRIDEMENT EXTREMITY;  Surgeon: Marianna Payment, MD;  Location: [facility];  Service: Orthopedics;  Laterality: Left;  . ORIF ANKLE FRACTURE Left 07/19/2014   Procedure: OPEN REDUCTION INTERNAL FIXATION (ORIF) ANKLE  FRACTURE;  Surgeon: Marianna Payment, MD;  Location: [facility];  Service: Orthopedics;  Laterality: Left;  . PROSTATE BIOPSY    . TONSILLECTOMY    . TOTAL HIP ARTHROPLASTY Right 03/02/2016   Procedure: RIGHT TOTAL HIP ARTHROPLASTY ANTERIOR APPROACH;  Surgeon: Leandrew Koyanagi, MD;  Location: [facility];  Service: Orthopedics;  Laterality: Right;   Social History   Occupational History  . 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 Occupation: truck Geophysicist/field seismologist  Tobacco Use  . Smoking status: Current Some Day Smoker    Packs/day: 0.50    Years: 30.00    Pack years: 15.00    Types: Cigarettes  . Smokeless tobacco: Never Used  Substance and Sexual Activity  . Alcohol use: Yes    Alcohol/week: 12.0 standard drinks    Types: 12 Cans of beer per week    Comment: 12 beers per week per pt.   . Drug use: No  . Sexual activity: Yes    Partners: Female

## 2019-11-10 ENCOUNTER — Telehealth: Payer: Self-pay | Admitting: Orthopaedic Surgery

## 2019-11-10 NOTE — Telephone Encounter (Signed)
Patient called, needs RF on his "arthritis medication", he is completely out. States he doesn't know the name of it. Send in to [facility]. pts [phone]

## 2019-11-11 ENCOUNTER — Other Ambulatory Visit: Payer: Self-pay | Admitting: Physician Assistant

## 2019-11-11 MED ORDER — MELOXICAM 7.5 MG PO TABS: 7.5000 mg | ORAL_TABLET | Freq: Every day | ORAL | 2 refills | Status: DC | PRN

## 2019-11-11 NOTE — Telephone Encounter (Signed)
I just sent in

## 2020-12-12 ENCOUNTER — Ambulatory Visit (INDEPENDENT_AMBULATORY_CARE_PROVIDER_SITE_OTHER): Payer: Medicare Other | Admitting: Primary Care

## 2021-02-07 ENCOUNTER — Ambulatory Visit (INDEPENDENT_AMBULATORY_CARE_PROVIDER_SITE_OTHER): Payer: Medicare Other | Admitting: Primary Care

## 2021-02-07 ENCOUNTER — Other Ambulatory Visit (INDEPENDENT_AMBULATORY_CARE_PROVIDER_SITE_OTHER): Payer: Self-pay | Admitting: Primary Care

## 2021-02-07 ENCOUNTER — Encounter (INDEPENDENT_AMBULATORY_CARE_PROVIDER_SITE_OTHER): Payer: Self-pay | Admitting: Primary Care

## 2021-02-07 ENCOUNTER — Other Ambulatory Visit: Payer: Self-pay

## 2021-02-07 ENCOUNTER — Telehealth (INDEPENDENT_AMBULATORY_CARE_PROVIDER_SITE_OTHER): Payer: Self-pay | Admitting: Primary Care

## 2021-02-07 VITALS — BP 137/91 | HR 77 | Temp 97.3°F | Ht 66.5 in | Wt 204.8 lb

## 2021-02-07 DIAGNOSIS — Z789 Other specified health status: Secondary | ICD-10-CM | POA: Diagnosis not present

## 2021-02-07 DIAGNOSIS — Z13220 Encounter for screening for lipoid disorders: Secondary | ICD-10-CM | POA: Diagnosis not present

## 2021-02-07 DIAGNOSIS — Z720 Tobacco use: Secondary | ICD-10-CM

## 2021-02-07 DIAGNOSIS — I10 Essential (primary) hypertension: Secondary | ICD-10-CM | POA: Diagnosis not present

## 2021-02-07 MED ORDER — HYDROCHLOROTHIAZIDE 12.5 MG PO TABS: 25.0000 mg | ORAL_TABLET | Freq: Every day | ORAL | 1 refills | Status: DC

## 2021-02-07 MED ORDER — AMLODIPINE BESYLATE 10 MG PO TABS: 10.0000 mg | ORAL_TABLET | Freq: Every day | ORAL | 1 refills | Status: DC

## 2021-02-07 NOTE — Progress Notes (Signed)
New Patient Office Visit  Subjective:  Patient ID: Jared Europe., male    DOB: 1951-06-29  Age: 70 y.o. MRN: [id number]  CC:  Chief Complaint  Patient presents with  . New Patient (Initial Visit)    HTN     HPI Jared Rhodes. Is a 70 year old obese male presents for establishment of care and management of HTN. Denies shortness of breath, headaches, chest pain or lower extremity edema  Past Medical History:  Diagnosis Date  . Hypertension   . Prostate cancer ([place]) 2018   radation 40 treatments     Past Surgical History:  Procedure Laterality Date  . I & D EXTREMITY Left 07/17/2014   Procedure: IRRIGATION AND DEBRIDEMENT EXTREMITY;  Surgeon: Marianna Payment, MD;  Location: [place];  Service: Orthopedics;  Laterality: Left;  . I & D EXTREMITY Left 07/19/2014   Procedure: IRRIGATION AND DEBRIDEMENT EXTREMITY;  Surgeon: Marianna Payment, MD;  Location: [place];  Service: Orthopedics;  Laterality: Left;  . ORIF ANKLE FRACTURE Left 07/19/2014   Procedure: OPEN REDUCTION INTERNAL FIXATION (ORIF) ANKLE FRACTURE;  Surgeon: Marianna Payment, MD;  Location: [place];  Service: Orthopedics;  Laterality: Left;  . PROSTATE BIOPSY    . TONSILLECTOMY    . TOTAL HIP ARTHROPLASTY Right 03/02/2016   Procedure: RIGHT TOTAL HIP ARTHROPLASTY ANTERIOR APPROACH;  Surgeon: Leandrew Koyanagi, MD;  Location: [place];  Service: Orthopedics;  Laterality: Right;    Family History  Problem Relation Age of Onset  . Alcohol abuse Mother   . Hyperlipidemia Brother   . Heart disease Sister   . Colon cancer Neg Hx     Social History   Socioeconomic History  . Marital status: Legally Separated    Spouse name: n/a  . Number of children: 0  . Years of education: 11th grade  . Highest education level: Not on file  Occupational History  . 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 Occupation: truck Geophysicist/field seismologist  Tobacco Use  . Smoking status: Current Some Day Smoker    Packs/day:  0.50    Years: 30.00    Pack years: 15.00    Types: Cigarettes  . Smokeless tobacco: Never Used  Vaping Use  . Vaping Use: Never used  Substance and Sexual Activity  . Alcohol use: Yes    Alcohol/week: 12.0 standard drinks    Types: 12 Cans of beer per week    Comment: 12 beers per week per pt.   . Drug use: No  . Sexual activity: Yes    Partners: Female  Other Topics Concern  . Not on file  Social History Narrative   Lives alone. 2 brothers and 1 sister live in [place].  1 half-sister lives in [place].   Social Determinants of Health   Financial Resource Strain: Not on file  Food Insecurity: Not on file  Transportation Needs: Not on file  Physical Activity: Not on file  Stress: Not on file  Social Connections: Not on file  Intimate Partner Violence: Not on file    ROS Review of Systems Pertinent positives and negatives noted in HPI Objective:   Today's Vitals: BP (!) 137/91 (BP Location: Right Arm, Patient Position: Sitting, Cuff Size: Normal)   Pulse 77   Temp (!) 97.3 F (36.3 C) (Temporal)   Ht 5' 6.5" (1.689 m)   Wt 204 lb 12.8 oz (92.9 kg)   SpO2 100%   BMI 32.56 kg/m  Physical Exam Vitals:   02/07/21 0848 02/07/21 0902  BP: (!) 146/91 (!) 137/91  Pulse: 78 77  Temp: (!) 97.3 F (36.3 C)   TempSrc: Temporal   SpO2: 100%   Weight: 204 lb 12.8 oz (92.9 kg)   Height: 5' 6.5" (1.689 m)    General: Vital signs reviewed.  Patient is well-developed and well-nourished, obese male in no acute distress and cooperative with exam.  Head: Normocephalic and atraumatic. Eyes: EOMI, conjunctivae normal, no scleral icterus.  Neck: Supple, trachea midline, normal ROM, no JVD, masses, thyromegaly, or carotid bruit present.  Cardiovascular: RRR, S1 normal, S2 normal, no murmurs, gallops, or rubs. Pulmonary/Chest: Clear to auscultation bilaterally, no wheezes, rales, or rhonchi. Abdominal: Soft, non-tender, non-distended, BS +, no masses, organomegaly, or guarding  present.  Musculoskeletal: No joint deformities, erythema, or stiffness, ROM full and nontender. Extremities: No lower extremity edema bilaterally,  pulses symmetric and intact bilaterally. No cyanosis or clubbing. Neurological: A&O x3, Strength is normal and symmetric bilaterally, cranial nerve II-XII are grossly intact, no focal motor deficit Skin: Warm, dry and intact. No rashes or erythema. Psychiatric: Normal mood and affect. speech and behavior is normal. Cognition and memory are normal.  Assessment & Plan:   Problem List Items Addressed This Visit    HTN (hypertension) - Primary (Chronic)   Relevant Medications   hydrochlorothiazide (HYDRODIURIL) 12.5 MG tablet    Other Visit Diagnoses    Lipid screening       Relevant Orders   Lipid Panel (Completed)   Tobacco abuse       Social drinker       Relevant Orders   CBC with Differential (Completed)   Comprehensive metabolic panel (Completed)   Lipid Panel (Completed)      Outpatient Encounter Medications as of 02/07/2021  Medication Sig  . hydrochlorothiazide (HYDRODIURIL) 12.5 MG tablet Take 2 tablets (25 mg total) by mouth daily.  . Multiple Vitamins-Minerals (CENTRUM SILVER PO) Take 1 tablet by mouth daily.  . [DISCONTINUED] amLODipine (NORVASC) 10 MG tablet Take 1 tablet (10 mg total) by mouth daily.  . [DISCONTINUED] losartan (COZAAR) 50 MG tablet Take 50 mg by mouth daily.   . [DISCONTINUED] meloxicam (MOBIC) 7.5 MG tablet Take 1 tablet (7.5 mg total) by mouth daily as needed for pain.  . [DISCONTINUED] naproxen (NAPROSYN) 500 MG tablet TAKE 1 TABLET BY MOUTH TWICE DAILY WITH A MEAL   Facility-Administered Encounter Medications as of 02/07/2021  Medication  . 0.9 %  sodium chloride infusion   Priest was seen today for new patient (initial visit).  Diagnoses and all orders for this visit:  Primary hypertension -     hydrochlorothiazide (HYDRODIURIL) take 1 daily (25 mg total) by mouth daily.  And added amlodipine 10  mg.  Goal of therapy less than 130/80.  Discussed, low-sodium, DASH diet, medication compliance, 150 minutes of moderate intensity exercise per week. Discussed medication compliance, adverse effects.  Lipid screening Secondary to obesity -     Lipid Panel  Tobacco abuse  Each visit we will discuss cessation.  He is aware of increased risk of lung cancer, elevate and blood pressure and respiratory complications  Social drinker -     CBC with Differential -     Comprehensive metabolic panel -     Lipid Panel  Other orders -     Discontinue: amLODipine (NORVASC) 10 MG tablet; Take 1 tablet (10 mg total) by mouth daily.   Follow-up: Return in about 3 weeks (around  02/28/2021) for Bp f/u in person .   Kerin Perna, NP

## 2021-02-07 NOTE — Progress Notes (Signed)
Pt is fasting 

## 2021-02-07 NOTE — Patient Instructions (Signed)

## 2021-02-07 NOTE — Telephone Encounter (Signed)
Patient has recently been prescribed amLODipine (NORVASC) 10 MG tablet and has concerns related to the warning that it may make him drowsy. Patient is requesting to be contacted by a member of clinical staff to discuss side effects further and in relation to their job. Please contact to advise

## 2021-02-08 LAB — CBC WITH DIFFERENTIAL/PLATELET
Basophils Absolute: 0 10*3/uL (ref 0.0–0.2)
Basos: 1 %
EOS (ABSOLUTE): 0.2 10*3/uL (ref 0.0–0.4)
Eos: 5 %
Hematocrit: 38.6 % (ref 37.5–51.0)
Hemoglobin: 13 g/dL (ref 13.0–17.7)
Immature Grans (Abs): 0 10*3/uL (ref 0.0–0.1)
Immature Granulocytes: 0 %
Lymphocytes Absolute: 1 10*3/uL (ref 0.7–3.1)
Lymphs: 24 %
MCH: 29.7 pg (ref 26.6–33.0)
MCHC: 33.7 g/dL (ref 31.5–35.7)
MCV: 88 fL (ref 79–97)
Monocytes Absolute: 0.4 10*3/uL (ref 0.1–0.9)
Monocytes: 8 %
Neutrophils Absolute: 2.7 10*3/uL (ref 1.4–7.0)
Neutrophils: 62 %
Platelets: 242 10*3/uL (ref 150–450)
RBC: 4.37 x10E6/uL (ref 4.14–5.80)
RDW: 12.8 % (ref 11.6–15.4)
WBC: 4.4 10*3/uL (ref 3.4–10.8)

## 2021-02-08 LAB — COMPREHENSIVE METABOLIC PANEL
ALT: 16 IU/L (ref 0–44)
AST: 22 IU/L (ref 0–40)
Albumin/Globulin Ratio: 1.2 (ref 1.2–2.2)
Albumin: 4.1 g/dL (ref 3.8–4.8)
Alkaline Phosphatase: 53 IU/L (ref 44–121)
BUN/Creatinine Ratio: 18 (ref 10–24)
BUN: 14 mg/dL (ref 8–27)
Bilirubin Total: 0.4 mg/dL (ref 0.0–1.2)
CO2: 24 mmol/L (ref 20–29)
Calcium: 9.3 mg/dL (ref 8.6–10.2)
Chloride: 106 mmol/L (ref 96–106)
Creatinine, Ser: 0.8 mg/dL (ref 0.76–1.27)
GFR calc Af Amer: 105 mL/min/{1.73_m2} (ref >59)
GFR calc non Af Amer: 91 mL/min/{1.73_m2} (ref >59)
Globulin, Total: 3.4 g/dL (ref 1.5–4.5)
Glucose: 90 mg/dL (ref 65–99)
Potassium: 4.4 mmol/L (ref 3.5–5.2)
Sodium: 143 mmol/L (ref 134–144)
Total Protein: 7.5 g/dL (ref 6.0–8.5)

## 2021-02-08 LAB — LIPID PANEL
Chol/HDL Ratio: 2.5 ratio (ref 0.0–5.0)
Cholesterol, Total: 164 mg/dL (ref 100–199)
HDL: 65 mg/dL (ref >39)
LDL Chol Calc (NIH): 92 mg/dL (ref 0–99)
Triglycerides: 31 mg/dL (ref 0–149)
VLDL Cholesterol Cal: 7 mg/dL (ref 5–40)

## 2021-02-09 ENCOUNTER — Telehealth (INDEPENDENT_AMBULATORY_CARE_PROVIDER_SITE_OTHER): Payer: Self-pay

## 2021-02-09 NOTE — Telephone Encounter (Signed)
Patient aware of normal labs. Jared Rhodes, CMA  

## 2021-02-09 NOTE — Telephone Encounter (Signed)
Please contact patient to discuss.

## 2021-02-09 NOTE — Telephone Encounter (Signed)
-----   Message from Kerin Perna, NP sent at 02/09/2021  2:10 PM EST ----- All labs normal

## 2021-02-11 MED ORDER — HYDROCHLOROTHIAZIDE 25 MG PO TABS: 25.0000 mg | ORAL_TABLET | Freq: Every day | ORAL | 3 refills | Status: DC

## 2021-02-23 ENCOUNTER — Other Ambulatory Visit (HOSPITAL_COMMUNITY): Payer: Self-pay | Admitting: Urology

## 2021-02-23 DIAGNOSIS — C61 Malignant neoplasm of prostate: Secondary | ICD-10-CM

## 2021-02-23 DIAGNOSIS — R9721 Rising PSA following treatment for malignant neoplasm of prostate: Secondary | ICD-10-CM

## 2021-02-28 ENCOUNTER — Encounter (INDEPENDENT_AMBULATORY_CARE_PROVIDER_SITE_OTHER): Payer: Self-pay | Admitting: Primary Care

## 2021-02-28 ENCOUNTER — Ambulatory Visit (INDEPENDENT_AMBULATORY_CARE_PROVIDER_SITE_OTHER): Payer: Medicare Other | Admitting: Primary Care

## 2021-02-28 ENCOUNTER — Other Ambulatory Visit: Payer: Self-pay

## 2021-02-28 DIAGNOSIS — I10 Essential (primary) hypertension: Secondary | ICD-10-CM | POA: Diagnosis not present

## 2021-02-28 MED ORDER — HYDROCHLOROTHIAZIDE 25 MG PO TABS: 25.0000 mg | ORAL_TABLET | Freq: Every day | ORAL | 3 refills | Status: DC

## 2021-02-28 NOTE — Progress Notes (Signed)
[place]    [name] Jared Rhodes is a 70 year old male in today for  hypertension evaluation, on previous visit medication was adjusted to include HCTZ increase to 25mg . Previously on 12.5mg  misunderstanding was suppose to take 2 until gone but stop taking them and picked up the new bottle of HCTZ 25mg . He was taking 2 HCTZ 25mg  and 1 amlodipine 10mg  daily. After discussion take 1 HCTZ 25 mg and if edema in feet may take 2 if needed. Bp has improved since last visit.  Denies shortness of breath, headaches, chest pain or lower extremity edema, sudden onset, vision changes, unilateral weakness, dizziness, paresthesias Patient  Is adherence with medications.  Current Medication List Current Outpatient Medications on File Prior to Visit  Medication Sig Dispense Refill  . amLODipine (NORVASC) 10 MG tablet TAKE 1 TABLET(10 MG) BY MOUTH DAILY 90 tablet 0  . Multiple Vitamins-Minerals (CENTRUM SILVER PO) Take 1 tablet by mouth daily.     Current Facility-Administered Medications on File Prior to Visit  Medication Dose Route Frequency Provider Last Rate Last Admin  . 0.9 %  sodium chloride infusion  500 mL Intravenous Once Doran Stabler, MD       Past Medical History  Past Medical History:  Diagnosis Date  . Hypertension   . 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  Constitutional:      Appearance: He is obese.  HENT:     Head: Normocephalic.     Right Ear: External ear normal.     Left Ear: External ear normal.     Nose: Nose normal.  Cardiovascular:     Rate and Rhythm: Normal rate and regular rhythm.  Pulmonary:     Effort: Pulmonary effort is normal.     Breath sounds: Normal breath sounds.   Abdominal:     General: Bowel sounds are normal. There is distension.     Palpations: Abdomen is soft.  Musculoskeletal:        General: Normal range of motion.     Cervical back: Normal range of motion and neck supple.  Skin:    General: Skin is warm and dry.  Neurological:     Mental Status: He is alert and oriented to person, place, and time.  Psychiatric:        Mood and Affect: Mood normal.        Behavior: Behavior normal.        Thought Content: Thought content normal.        Judgment: Judgment normal.      ROS Pertinent positive and negative noted in HPI. Last 3 Office BP readings: BP Readings from Last 3 Encounters:  02/28/21 131/88  02/07/21 (!) 137/91  11/18/18 (!) 155/80    BMET    Component Value Date/Time   NA 143 02/07/2021 0936   K 4.4 02/07/2021 0936   CL 106 02/07/2021 0936   CO2 24 02/07/2021 0936   GLUCOSE 90 02/07/2021 0936   GLUCOSE 91 11/18/2018 1138   BUN 14 02/07/2021 0936   CREATININE 0.80 02/07/2021 0936   CALCIUM 9.3 02/07/2021 0936   GFRNONAA 91 02/07/2021 0936   GFRAA 105 02/07/2021 0936    Renal function: Estimated Creatinine Clearance:  95.9 mL/min (by C-G formula based on SCr of 0.8 mg/dL).  Clinical ASCVD: Yes  The 10-year ASCVD risk score Mikey Bussing DC Jr., et al., 2013) is: 27.4%   Values used to calculate the score:     Age: 34 years     Sex: Male     Is Non-Hispanic African American: Yes     Diabetic: No     Tobacco smoker: Yes     Systolic Blood Pressure: 212 mmHg     Is BP treated: Yes     HDL Cholesterol: 65 mg/dL     Total Cholesterol: 164 mg/dL   A/P: Hypertension longstandingCTZ 25mg  and 1 amlodipine 10mg  daily.  on current medications. BP Goal = 130/80 -140/90  MmHg at goal. Patient is adherent with current medications.  -Continued  -F/u labs ordered - n/a -Counseled on lifestyle modifications for blood pressure control including reduced dietary sodium, increased exercise, adequate sleep  Kerin Perna

## 2021-03-08 ENCOUNTER — Ambulatory Visit (HOSPITAL_COMMUNITY): Payer: Medicare Other

## 2021-03-21 ENCOUNTER — Ambulatory Visit (HOSPITAL_COMMUNITY): Payer: Medicare Other

## 2021-03-31 ENCOUNTER — Other Ambulatory Visit: Payer: Self-pay

## 2021-03-31 ENCOUNTER — Ambulatory Visit (HOSPITAL_COMMUNITY)
Admission: RE | Admit: 2021-03-31 | Discharge: 2021-03-31 | Disposition: A | Discharge status: Home / Self Care | Payer: Medicare Other | Source: Ambulatory Visit | Attending: Urology | Admitting: Urology

## 2021-03-31 DIAGNOSIS — R9721 Rising PSA following treatment for malignant neoplasm of prostate: Secondary | ICD-10-CM | POA: Insufficient documentation

## 2021-03-31 DIAGNOSIS — C61 Malignant neoplasm of prostate: Secondary | ICD-10-CM | POA: Diagnosis present

## 2021-03-31 MED ORDER — PIFLIFOLASTAT F 18 (PYLARIFY) INJECTION
9.0000 | Freq: Once | INTRAVENOUS | Status: AC
  Administered 2021-03-31: 8.4 via INTRAVENOUS

## 2021-04-10 ENCOUNTER — Encounter: Payer: Self-pay | Admitting: Medical Oncology

## 2021-04-10 NOTE — Progress Notes (Signed)
Left message  Requesting a return call to discuss referral to the clinic 4/26.

## 2021-04-13 ENCOUNTER — Encounter: Payer: Self-pay | Admitting: Medical Oncology

## 2021-04-17 ENCOUNTER — Encounter: Payer: Self-pay | Admitting: Medical Oncology

## 2021-04-17 NOTE — Progress Notes (Signed)
Left message with [facility] appointment reminder for 4/26, arriving at 12:30. I reviewed location, valet parking, registration and COVID protocols. I asked him to call me to confirm appointment.

## 2021-04-18 ENCOUNTER — Inpatient Hospital Stay: Payer: Medicare Other | Attending: Oncology | Admitting: Oncology

## 2021-04-18 ENCOUNTER — Ambulatory Visit
Admission: RE | Admit: 2021-04-18 | Discharge: 2021-04-18 | Disposition: A | Discharge status: Home / Self Care | Payer: Medicare Other | Source: Ambulatory Visit | Attending: Radiation Oncology | Admitting: Radiation Oncology

## 2021-04-18 NOTE — Progress Notes (Signed)
GU Location of Tumor / Histology: Recurrent prostatic adenocarcinoma  If Prostate Cancer, Gleason Score is (4 + 4) and PSA is (238)  [name]. was diagnosed with prostate cancer in 2017. His staging studies were negative and he proceeded with treatment with long term ADT and radiation therapy. Though somewhat non compliant with some of his radiation he completed in July 2018. His PSA began to rise in April 2021 (3.7). Then, in 02/20/21 his PSA was 9.52. A PET was done and revealed right prostatic tracer uptake.  Biopsies of prostate (if applicable) revealed:   Past/Anticipated interventions by urology, if any: prostate biopsy, staging scans, long term ADT, surveillance, PET, referral back to [facility]  Past/Anticipated interventions by medical oncology, if any: no  Weight changes, if any: no  Bowel/Bladder complaints, if any:    Nausea/Vomiting, if any: no  Pain issues, if any:    SAFETY ISSUES: Prior radiation? yes Pacemaker/ICD?  Possible current pregnancy? no, male patient Is the patient on methotrexate?   Current Complaints / other details:  70 year old male.

## 2021-04-20 ENCOUNTER — Other Ambulatory Visit (INDEPENDENT_AMBULATORY_CARE_PROVIDER_SITE_OTHER): Payer: Self-pay | Admitting: Primary Care

## 2021-04-28 ENCOUNTER — Encounter: Payer: Self-pay | Admitting: Medical Oncology

## 2021-04-28 NOTE — Progress Notes (Signed)
Spoke with patient to confirm appointment for [facility] 5/13. He was unable to attend 4/26. We discussed the clinic and what to expect. He voiced understanding.

## 2021-05-04 ENCOUNTER — Encounter: Payer: Self-pay | Admitting: Medical Oncology

## 2021-05-04 NOTE — Progress Notes (Signed)
Left message reminder for [facility] 5/13, arriving @ 8 am. I asked him to return my call to confirm.

## 2021-05-05 ENCOUNTER — Inpatient Hospital Stay: Payer: Medicare Other | Attending: Oncology | Admitting: Oncology

## 2021-05-05 ENCOUNTER — Ambulatory Visit
Admission: RE | Admit: 2021-05-05 | Discharge: 2021-05-05 | Disposition: A | Discharge status: Home / Self Care | Payer: Medicare Other | Source: Ambulatory Visit | Attending: Radiation Oncology | Admitting: Radiation Oncology

## 2021-05-11 ENCOUNTER — Encounter: Payer: Self-pay | Admitting: Medical Oncology

## 2021-05-11 NOTE — Progress Notes (Signed)
Called patient with appointment for [facility] 5/24. He informed me his sister passed away and he has learned the funeral will be Tuesday afternoon. I will follow up with Dr. Alinda Money regarding new date and I will call him back. He voiced understanding.

## 2021-05-11 NOTE — Progress Notes (Signed)
Patient was a no show for [facility] 5/13. He called asking to be reschedule. I will follow up with Dr. Alinda Money and call him back.

## 2021-05-12 ENCOUNTER — Encounter: Payer: Self-pay | Admitting: Medical Oncology

## 2021-05-12 NOTE — Progress Notes (Signed)
Called patient with [facility] appt for 6/10, arriving @ 8:15 am. He voiced understanding.

## 2021-05-31 ENCOUNTER — Ambulatory Visit (INDEPENDENT_AMBULATORY_CARE_PROVIDER_SITE_OTHER): Payer: Medicare Other | Admitting: Primary Care

## 2021-06-01 ENCOUNTER — Encounter: Payer: Self-pay | Admitting: Medical Oncology

## 2021-06-01 NOTE — Progress Notes (Signed)
Left message with [facility] appt 6/10,arriving @ 8:15 am. I asked him to call back to confirm.

## 2021-06-02 ENCOUNTER — Other Ambulatory Visit: Payer: Self-pay

## 2021-06-02 ENCOUNTER — Encounter: Payer: Self-pay | Admitting: Radiation Oncology

## 2021-06-02 ENCOUNTER — Encounter: Payer: Self-pay | Admitting: Medical Oncology

## 2021-06-02 ENCOUNTER — Inpatient Hospital Stay: Payer: Medicare Other | Attending: Oncology | Admitting: Oncology

## 2021-06-02 ENCOUNTER — Ambulatory Visit
Admission: RE | Admit: 2021-06-02 | Discharge: 2021-06-02 | Disposition: A | Discharge status: Home / Self Care | Payer: Medicare Other | Source: Ambulatory Visit | Attending: Radiation Oncology | Admitting: Radiation Oncology

## 2021-06-02 VITALS — BP 131/88 | HR 74 | Temp 97.6°F | Resp 18 | Ht 68.0 in | Wt 200.6 lb

## 2021-06-02 DIAGNOSIS — C61 Malignant neoplasm of prostate: Secondary | ICD-10-CM | POA: Insufficient documentation

## 2021-06-02 DIAGNOSIS — Z923 Personal history of irradiation: Secondary | ICD-10-CM | POA: Insufficient documentation

## 2021-06-02 DIAGNOSIS — I700 Atherosclerosis of aorta: Secondary | ICD-10-CM | POA: Insufficient documentation

## 2021-06-02 DIAGNOSIS — Z880 Allergy status to penicillin: Secondary | ICD-10-CM | POA: Diagnosis not present

## 2021-06-02 DIAGNOSIS — I10 Essential (primary) hypertension: Secondary | ICD-10-CM | POA: Insufficient documentation

## 2021-06-02 DIAGNOSIS — Z79899 Other long term (current) drug therapy: Secondary | ICD-10-CM | POA: Insufficient documentation

## 2021-06-02 DIAGNOSIS — F17210 Nicotine dependence, cigarettes, uncomplicated: Secondary | ICD-10-CM | POA: Diagnosis not present

## 2021-06-02 DIAGNOSIS — J439 Emphysema, unspecified: Secondary | ICD-10-CM | POA: Insufficient documentation

## 2021-06-02 DIAGNOSIS — I2510 Atherosclerotic heart disease of native coronary artery without angina pectoris: Secondary | ICD-10-CM | POA: Insufficient documentation

## 2021-06-02 NOTE — Progress Notes (Signed)
Radiation Oncology         [phone] ________________________________  Multidisciplinary Prostate Cancer Clinic  Radiation Oncology Consultation  Name: Jared Rhodes. MRN: [id number]  Date: 06/02/2021  DOB: 21-Aug-1951  QQ:[id number], Jared Cage, NP  Jared Bring, MD   REFERRING PHYSICIAN: Raynelle Bring, MD  DIAGNOSIS: 70 y.o. gentleman with recurrent prostate cancer s/p LT-ADT and XRT in 2018.     ICD-10-CM   1. Malignant neoplasm of prostate ([place])  C61     2. Prostate cancer ([place])  C61       HISTORY OF PRESENT ILLNESS::Jared Rhodes. is a 70 y.o. gentleman. He was noted to have an elevated PSA of 238 by his primary care physician, Dr. Ardeth Perfect.  Accordingly, he was referred for evaluation in urology by Dr. Alinda Money on Nov. 20, 2017,  digital rectal examination was performed at that time revealing significant firmness of the entire left base and left mid prostate.  The patient proceeded to transrectal ultrasound with 12 biopsies of the prostate on 12/07/16.  The prostate volume measured 67.8 cc.  Out of 12 core biopsies,11 were positive.  The maximum Gleason score was 4+4=8, and this was seen in the entire mid and base of the prostate.  CT A&P and bone scan were performed on 01/14/17, and both were negative for metastatic disease. He was evaluated in the multidisciplinary prostate cancer clinic on 02/01/17. He subsequently received LT-ADT and 8 weeks of external beam radiation therapy. He received radiation therapy 04/17/17 through 07/08/17 and concurrent ADT from 01/2017 through 09/2018.  His PSA reached a nadir of 0.47 in 09/2018. Unfortunately, his PSA began to rise again once he was off ADT, reaching 9.52 by 02/2021. He proceeded to PSMA scan on 03/31/21 showing: right-sided prostatic tracer uptake, most consistent with residual/recurrent disease; no evidence of nodal or osseous uptake to suggest distant metastasis.  The patient reviewed the biopsy results with his urologist  and he has kindly been referred today to the multidisciplinary prostate cancer clinic for presentation of pathology and radiology studies in our conference for discussion of potential radiation treatment options and clinical evaluation.  PREVIOUS RADIATION THERAPY: Yes  04/17/17 - 06/28/17: 1. The prostate, seminal vesicles, and pelvic lymph nodes were initially treated to 45 Gy in 25 fractions of 1.8 Gy 2. The prostate only was boosted to 75 Gy with 15 additional fractions of 2.0 Gy  PAST MEDICAL HISTORY:  has a past medical history of Hypertension and Prostate cancer ([place]) (2018).    PAST SURGICAL HISTORY: Past Surgical History:  Procedure Laterality Date   I & D EXTREMITY Left 07/17/2014   Procedure: IRRIGATION AND DEBRIDEMENT EXTREMITY;  Surgeon: Marianna Payment, MD;  Location: [place];  Service: Orthopedics;  Laterality: Left;   I & D EXTREMITY Left 07/19/2014   Procedure: IRRIGATION AND DEBRIDEMENT EXTREMITY;  Surgeon: Marianna Payment, MD;  Location: [place];  Service: Orthopedics;  Laterality: Left;   ORIF ANKLE FRACTURE Left 07/19/2014   Procedure: OPEN REDUCTION INTERNAL FIXATION (ORIF) ANKLE FRACTURE;  Surgeon: Marianna Payment, MD;  Location: [place];  Service: Orthopedics;  Laterality: Left;   PROSTATE BIOPSY     TONSILLECTOMY     TOTAL HIP ARTHROPLASTY Right 03/02/2016   Procedure: RIGHT TOTAL HIP ARTHROPLASTY ANTERIOR APPROACH;  Surgeon: Leandrew Koyanagi, MD;  Location: [place];  Service: Orthopedics;  Laterality: Right;    FAMILY HISTORY: family history includes Alcohol abuse in his mother; Heart disease in his sister; Hyperlipidemia in his brother.  SOCIAL HISTORY:  reports that he has been smoking cigarettes. He has a 15.00 pack-year smoking history. He has never used smokeless tobacco. He reports current alcohol use of about 12.0 standard drinks of alcohol per week. He reports that he does not use drugs.  ALLERGIES: Penicillins  MEDICATIONS:  Current Outpatient Medications   Medication Sig Dispense Refill   amLODipine (NORVASC) 10 MG tablet TAKE 1 TABLET(10 MG) BY MOUTH DAILY 90 tablet 0   hydrochlorothiazide (HYDRODIURIL) 25 MG tablet Take 1 tablet (25 mg total) by mouth daily. May take and additional tablet for increase edema in feet. 90 tablet 3   losartan (COZAAR) 50 MG tablet Take 1 tablet by mouth daily.     Multiple Vitamins-Minerals (CENTRUM SILVER PO) Take 1 tablet by mouth daily.     Current Facility-Administered Medications  Medication Dose Route Frequency Provider Last Rate Last Admin   0.9 %  sodium chloride infusion  500 mL Intravenous Once Danis, Kirke Corin, MD        REVIEW OF SYSTEMS:  On review of systems, the patient reports that he is doing well overall. He denies any chest pain, shortness of breath, cough, fevers, chills, night sweats, unintended weight changes. He denies any bowel disturbances, and denies abdominal pain, nausea or vomiting. He denies any new musculoskeletal or joint aches or pains. His IPSS was 1, indicating minimal urinary symptoms. His SHIM was 15, indicating he has moderate erectile dysfunction. A complete review of systems is obtained and is otherwise negative.   PHYSICAL EXAM:  Wt Readings from Last 3 Encounters:  06/02/21 200 lb 9.6 oz (91 kg)  02/28/21 202 lb 6.4 oz (91.8 kg)  02/07/21 204 lb 12.8 oz (92.9 kg)   Temp Readings from Last 3 Encounters:  06/02/21 97.6 F (36.4 C)  02/28/21 (!) 97.3 F (36.3 C) (Temporal)  02/07/21 (!) 97.3 F (36.3 C) (Temporal)   BP Readings from Last 3 Encounters:  06/02/21 131/88  02/28/21 131/88  02/07/21 (!) 137/91   Pulse Readings from Last 3 Encounters:  06/02/21 74  02/28/21 83  02/07/21 77   Pain Assessment Pain Score: 0-No pain/10  In general this is a well appearing man in no acute distress. He's alert and oriented x4 and appropriate throughout the examination. Cardiopulmonary assessment is negative for acute distress and he exhibits normal effort.    KPS  = 100  100 - Normal; no complaints; no evidence of disease. 90   - Able to carry on normal activity; minor signs or symptoms of disease. 80   - Normal activity with effort; some signs or symptoms of disease. 48   - Cares for self; unable to carry on normal activity or to do active work. 60   - Requires occasional assistance, but is able to care for most of his personal needs. 50   - Requires considerable assistance and frequent medical care. 59   - Disabled; requires special care and assistance. 83   - Severely disabled; hospital admission is indicated although death not imminent. 61   - Very sick; hospital admission necessary; active supportive treatment necessary. 10   - Moribund; fatal processes progressing rapidly. 0     - Dead  Karnofsky DA, Abelmann [place], Craver LS and Burchenal [facility] [phone]) The use of the nitrogen mustards in the palliative treatment of carcinoma: with particular reference to bronchogenic carcinoma Cancer 1 634-56   LABORATORY DATA:  Lab Results  Component Value Date   WBC 4.4 02/07/2021  HGB 13.0 02/07/2021   HCT 38.6 02/07/2021   MCV 88 02/07/2021   PLT 242 02/07/2021   Lab Results  Component Value Date   NA 143 02/07/2021   K 4.4 02/07/2021   CL 106 02/07/2021   CO2 24 02/07/2021   Lab Results  Component Value Date   ALT 16 02/07/2021   AST 22 02/07/2021   ALKPHOS 53 02/07/2021   BILITOT 0.4 02/07/2021     RADIOGRAPHY: No results found.    IMPRESSION/PLAN: 70 y.o. gentleman with recurrent prostate cancer s/p LT-ADT and XRT in 2018.    We discussed the patient's workup and outlined the nature of prostate cancer in this setting. He is eligible for a variety of potential treatment options including brachytherapy, cryotherapy, SBRT, or prostatectomy. We discussed the available radiation techniques, and focused on the details and logistics of delivery. We discussed and outlined the risks, benefits, short and long-term effects associated with  brachytherapy and SBRT, and compared and contrasted these with prostatectomy and cryotherapy. He appears to have a good understanding of his disease and our treatment recommendations which are of curative intent. He was encouraged to ask questions that were answered to his/their stated satisfaction.  At the end of the conversation the patient remains undecided but appears to be leaning towards prostatectomy. He will follow up with Dr. Alinda Money for a repeat PSA and prostate biopsy. He will make his final decision afterwards. We will share our discussion with Dr. Alinda Money. We look forward to following along in his care and participating as needed.   I personally spent 60 minutes in this encounter including chart review, reviewing radiological studies, meeting face-to-face with the patient, entering orders and completing documentation.   ------------------------------------------------   Tyler Pita, MD [place] Director and Director of Stereotactic Radiosurgery Direct Dial: [phone]  Fax: [phone] [email or web address]  Skype  [email or web address]   This document serves as a record of services personally performed by Tyler Pita, MD. It was created on his behalf by Wilburn Mylar, a trained medical scribe. The creation of this record is based on the scribe's personal observations and the provider's statements to them. This document has been checked and approved by the attending provider.

## 2021-06-02 NOTE — Consult Note (Signed)
[facility]     06/02/2021   --------------------------------------------------------------------------------   Ginnie Smart  MRN: [id number]  DOB: January 08, 1951, 70 year old Male  SSN: -[id number]   PRIMARY CARE:  Velna Hatchet, MD  REFERRING:  Ammie Dalton, NP  PROVIDER:  Raynelle Bring, M.D.  LOCATION:  [facility] [phone]     --------------------------------------------------------------------------------   CC/HPI: CC: Prostate Cancer   PCP: Dr. Velna Hatchet  Location of consult: [facility] - Prostate Cancer Multidisciplinary Clinic   Mr. Colavito is a 70 year old gentleman with a history of hypertension who presented to me in November 2017 with an elevated PSA of 238.8 and a suspicious prostate exam with nodularity of the left side of the prostate. He underwent a TRUS biopsy of the prostate on 12/07/16 that confirmed Gleason 4+4=8 adenocarcinoma of the prostate with 11 out of 12 biopsy cores positive for malignancy. Conventional imaging at that time with a bone scan and CT scan were negative for metastatic disease. He was seen in the multidisciplinary clinic at that time and proceeded with long term ADT and EBRT from April through July 2018. Although he did have some problems with non-compliance, he completed about 18 months of androgen deprivation in October of 2019 with a PSA nadir 0.47 in October 2019. His PSA has since been steadily rising to 1.2 in March 2020, 3.7 in April 2021 and 9.52 in February 2022. He has continued to be somewhat non-compliant with recommended follow up and did not get his PSA drawn last week as scheduled. He did have a PSMA PET scan on 03/31/21 that demonstrated right side uptake within the prostate but without evidence of metastatic disease.   PMH: Hypertension.  PSH: No abdominal surgeries.   Urinary function: IPSS is 1.  Erectile function: SHIM score is 14.     ALLERGIES: penicillin      MEDICATIONS: Fluid Pill  Hypertension Med  Multiple Vitamin  Naproxen  Sildenafil Citrate 20 mg tablet Take 2-5 tablets PO Daily prn 30-60 minutes before planned activity.     GU PSH: Locm 300-399Mg /Ml Iodine,1Ml - 2018 PLACE RT DEVICE/MARKER, PROS - 2018 Prostate Needle Biopsy - 2017     NON-GU PSH: Hip Arthroscopy/surgery, Right Surgical Pathology, Gross And Microscopic Examination For Prostate Needle - 2017     GU PMH: ED due to arterial insufficiency - 02/20/2021, - 2019 Prostate Cancer - 02/20/2021 Rising PSA after prostate cancer treatment - 02/20/2021      PMH Notes:   1) High risk prostate cancer: He was diagnosed with cT2b N0 M0, Gleason 4+4=8 adenocarcinoma with a pretreatment PSA of 238. His staging studies were negative and he proceeded with treatment with long term ADT and radiation therapy. He completed radiation under the care of Dr. Tammi Klippel from Apr-Jul 2018. He was somewhat non-compliant with some of his radiation visits but was able to receive 75 Gy to the prostate and 45 Gy to the pelvis. He continued to be non-compliant with follow up but was noted to have evidence of biochemical recurrence in April 2021.   Feb 2018: Began ADT  Apr-Jul 2018: EBRT  Oct 2019: Stopped ADT  Apr 2021: BCR   NON-GU PMH: No Non-GU PMH    FAMILY HISTORY: 12 daughters - Runs in Family 2 sons - Runs in Family Diabetes - Father liver disease - Mother   SOCIAL HISTORY: Marital Status: Married Preferred Language: English; Ethnicity: Not Hispanic Or Latino; Race: Black or African American Current  Smoking Status: Patient smokes occasionally.   Tobacco Use Assessment Completed: Used Tobacco in last 30 days? Does drink.  Does not drink caffeine.    REVIEW OF SYSTEMS:    GU Review Male:   Patient denies frequent urination, hard to postpone urination, burning/ pain with urination, get up at night to urinate, leakage of urine, stream starts and stops, trouble starting your streams,  and have to strain to urinate .  Gastrointestinal (Lower):   Patient denies diarrhea and constipation.  Gastrointestinal (Upper):   Patient denies nausea and vomiting.  Constitutional:   Patient denies fever, night sweats, weight loss, and fatigue.  Skin:   Patient denies skin rash/ lesion and itching.  Eyes:   Patient denies blurred vision and double vision.  Ears/ Nose/ Throat:   Patient denies sore throat and sinus problems.  Hematologic/Lymphatic:   Patient denies swollen glands and easy bruising.  Cardiovascular:   Patient denies leg swelling and chest pains.  Respiratory:   Patient denies cough and shortness of breath.  Endocrine:   Patient denies excessive thirst.  Musculoskeletal:   Patient denies back pain and joint pain.  Neurological:   Patient denies headaches and dizziness.  Psychologic:   Patient denies depression and anxiety.   VITAL SIGNS: None   GU PHYSICAL EXAMINATION:    Prostate: Prostate about 40 grams. He has a small amount of nodularity noted toward the medial right prostate. There is no evidence of extraprostatic extension on exam.   MULTI-SYSTEM PHYSICAL EXAMINATION:    Constitutional: Well-nourished. No physical deformities. Normally developed. Good grooming.  Neck: Neck symmetrical, not swollen. Normal tracheal position.  Respiratory: No labored breathing, no use of accessory muscles. Clear bilaterally.   Cardiovascular: Normal temperature, normal extremity pulses, no swelling, no varicosities. regular rate and rhythm.  Lymphatic: No enlargement of neck, axillae, groin.  Skin: No paleness, no jaundice, no cyanosis. No lesion, no ulcer, no rash.  Neurologic / Psychiatric: Oriented to time, oriented to place, oriented to person. No depression, no anxiety, no agitation.  Gastrointestinal: No mass, no tenderness, no rigidity, non obese abdomen.  Eyes: Normal conjunctivae. Normal eyelids.  Ears, Nose, Mouth, and Throat: Left ear no scars, no lesions, no masses.  Right ear no scars, no lesions, no masses. Nose no scars, no lesions, no masses. Normal hearing. Normal lips.  Musculoskeletal: Normal gait and station of head and neck.     Complexity of Data:  Lab Test Review:   PSA  Records Review:   Pathology Reports, Previous Patient Records  X-Ray Review: PET Scan: Reviewed Films.     02/20/21 04/08/20 03/19/19 10/09/18 03/31/18 09/25/17 01/22/17  PSA  Total PSA 9.52 ng/mL 3.7 ng/ml 1.195 ng/ml 0.47 ng/mL 0.78 ng/mL 4.72 ng/mL 178.00 ng/dl    10/09/18  Hormones  Testosterone, Total 22.4 ng/dL   Notes:                     CLINICAL DATA: Status post radiation therapy for prostate cancer.  Rising PSA. Current PSA 9.5.   EXAM:  NUCLEAR MEDICINE PET SKULL BASE TO THIGH   TECHNIQUE:  8.4 mCi F18 Piflufolastat (Pylarify) was injected intravenously.  Full-ring PET imaging was performed from the skull base to thigh  after the radiotracer. CT data was obtained and used for attenuation  correction and anatomic localization.   COMPARISON: 11/18/2018 abdominopelvic CT.   FINDINGS:  NECK   No radiotracer activity in neck lymph nodes.   Incidental CT finding: No cervical adenopathy.  CHEST   No pulmonary parenchymal or thoracic nodal hypermetabolism.   Incidental CT finding: Aortic and minimal coronary artery  calcification. Right hemidiaphragm elevation. Centrilobular and  paraseptal emphysema, mild.   ABDOMEN/PELVIS   Prostate: Radiation seeds within the prostate. Foci of tracer  avidity including within the right lateral mid and apical gland.  These measure a S.U.V. max of 7.2 and 6.7 respectively.   Lymph nodes: No abnormal radiotracer accumulation within pelvic or  abdominal nodes.   Liver: No evidence of liver metastasis   Incidental CT finding: Normal adrenal glands. No renal calculi or  hydronephrosis. Abdominal aortic atherosclerosis. No abdominopelvic  adenopathy. Degraded evaluation of the pelvis, secondary to beam   hardening artifact from right hip arthroplasty.   SKELETON   No abnormal marrow activity. No focal osseous lesion.   IMPRESSION:  1. Right-sided prostatic tracer uptake, most consistent with  residual/recurrent disease.  2. No evidence of nodal or osseous uptake to suggest distant  metastasis.  3. Incidental findings, including: Aortic atherosclerosis  (ICD10-I70.0), coronary artery atherosclerosis and emphysema  (ICD10-J43.9).    Electronically Signed  By: Abigail Miyamoto M.D.  On: 04/03/2021 11:20   PROCEDURES: None   ASSESSMENT:      ICD-10 Details  1 GU:   Prostate Cancer - C61   2   Rising PSA after prostate cancer treatment - R97.21    PLAN:           Document Letter(s):  Created for Patient: Clinical Summary   Created for Patient: Missed Appointment         Notes:   1. Biochemically recurrent prostate cancer: I had a detailed discussion with Mr. California. We have reviewed his PSMA PET scan in the multidisciplinary GU tumor board conference earlier this morning. He appears to have evidence of local recurrence without evidence of distant metastases. He has not yet had a repeat PSA since his last PSA in February. After reviewing options for treatment, he does feel adamant that he wishes to optimize his chance for long-term survival. He therefore is most interested in salvage therapy for curative intent rather than systemic therapy. Considering his PSMA PET scan results, this would appear to be reasonable. However, I would like him to get a repeat PSA to ensure that he does not have an extremely rapid PSA doubling time. If reasonable, I will then recommend that he proceed with a prostate biopsy to definitively confirm local recurrence prior to proceeding with salvage curative treatment. We have reviewed options for salvage local therapy including surgical therapy with salvage radical prostatectomy vs. ablative therapies. We have reviewed the pros and cons of both approaches as  well as the side effect profiles of both approaches. Ultimately, he is interested in a salvage prostatectomy.   We discussed surgical therapy for prostate cancer including the different available surgical approaches. We discussed, in detail, the risks and expectations of surgery with regard to cancer control, urinary control, and erectile function as well as the expected postoperative recovery process. Additional risks of surgery including but not limited to bleeding, infection, hernia formation, nerve damage, lymphocele formation, bowel/rectal injury potentially necessitating colostomy, damage to the urinary tract resulting in urine leakage, urethral stricture, and the cardiopulmonary risks such as myocardial infarction, stroke, death, venothromboembolism, etc. were explained. The risk of open surgical conversion for robotic/laparoscopic prostatectomy was also discussed.   Even understanding the increased risk for urinary incontinence and almost certain erectile dysfunction, he does wish to optimize his long-term survival therefore is interested  in proceeding with a salvage prostatectomy if appropriate. Pending his PSA result, he will then be scheduled for a prostate biopsy to confirm local recurrence. If present, he would then be scheduled for a non nerve-sparing robot assisted laparoscopic radical prostatectomy and bilateral pelvic lymphadenectomy.   Considering his history of noncompliance, I have reiterated the importance of ongoing compliance for him to have the best possible outcome.   Cc: Juluis Mire, NP  Dr. Tyler Pita  Dr. Zola Button    E & M CODES: We spent 62 minutes dedicated to evaluation and management time, including face to face interaction, discussions on coordination of care, documentation, result review, and discussion with others as applicable.

## 2021-06-02 NOTE — Progress Notes (Signed)
                               Care Plan Summary  Name: Jared Rhodes DOB: [birth year]   Your Medical Team:   Urologist -  Dr. Raynelle Bring, [facility]  Radiation Oncologist - Dr. Tyler Pita, [facility]   Medical Oncologist - Dr. Zola Button, [facility]  Recommendations: 1) Check PSA   * These recommendations are based on information available as of today's consult.      Recommendations may change depending on the results of further tests or exams.  Next Steps: 1) Dr. Lynne Logan office will schedule PSA and follow up with results    When appointments need to be scheduled, you will be contacted by [facility] and/or [facility].  Questions?  Please do not hesitate to call Cira Rue, RN, BSN, OCN at [phone] any questions or concerns.  Shirlean Mylar is your Oncology Nurse Navigator and is available to assist you while you're receiving your medical care at [facility].

## 2021-06-02 NOTE — Progress Notes (Signed)
GU Location of Tumor / Histology: Recurrent prostatic adenocarcinoma   If Prostate Cancer, Gleason Score is (4 + 4) and PSA is (238)   Jared Rhodes. was diagnosed with prostate cancer in 2017. His staging studies were negative and he proceeded with treatment with long term ADT and radiation therapy. Though somewhat non compliant with some of his radiation he completed in July 2018. His PSA began to rise in April 2021 (3.7). Then, in 02/20/21 his PSA was 9.52. A PET was done and revealed right prostatic tracer uptake.   Biopsies of prostate (if applicable) revealed:  Past/Anticipated interventions by urology, if any: prostate biopsy, staging scans, long term ADT, surveillance, PET, referral back to [facility]   Past/Anticipated interventions by medical oncology, if any: no   Weight changes, if any: no   Bowel/Bladder complaints, if any:  IPSS 1. SHIM 15. Denies dysuria, hematuria, urinary leakage or incontinence.   Nausea/Vomiting, if any: no   Pain issues, if any:  no   SAFETY ISSUES: Prior radiation? yes Pacemaker/ICD? no Possible current pregnancy? no, male patient Is the patient on methotrexate? no   Current Complaints / other details:  70 year old male.

## 2021-06-02 NOTE — Progress Notes (Signed)
Hematology and Oncology Follow Up Visit  Jared Rhodes [id number] 07/09/1951 70 y.o. 06/02/2021 8:51 AM Kerin Perna, NPEdwards, [place] Cage, NP   Principle Diagnosis: 70 year old man with prostate cancer diagnosed in 2017.  He presented with a Gleason score 4+4 = 8 and a PSA of 240.  He was found to have clinical stage T2b disease.   Prior Therapy:  Definitive therapy with radiation completed in July 2018 for a total of 75 Gray combined with androgen deprivation therapy.  Current therapy: Under consideration for additional salvage therapy.  Interim History: Mr. Mcgrory returns today for repeat evaluation.  He is a 70 year old man evaluated in the prostate cancer multidisciplinary clinic in 2018 for the diagnosis of prostate cancer.  He was treated with definitive therapy and the PSA nadir was 0.047 in October 2019.  His PSA however developed rise subsequently and in February 2022 was up to 9.52.  He had failed to follow-up on multiple occasions with repeat PSA testing by Dr. Alinda Money but did undergo PSMA PET scan in April 2022 which showed an isolated area of recurrence in the right side of the prostate.  No additional uptake was noted.  Clinically, he reports no complaints at this time.  He denies any frequency urgency or hesitancy.  He denies any constitutional symptoms and continues to work full-time.     Medications: I have reviewed the patient's current medications.  Current Outpatient Medications  Medication Sig Dispense Refill   amLODipine (NORVASC) 10 MG tablet TAKE 1 TABLET(10 MG) BY MOUTH DAILY 90 tablet 0   hydrochlorothiazide (HYDRODIURIL) 25 MG tablet Take 1 tablet (25 mg total) by mouth daily. May take and additional tablet for increase edema in feet. 90 tablet 3   Multiple Vitamins-Minerals (CENTRUM SILVER PO) Take 1 tablet by mouth daily.     Current Facility-Administered Medications  Medication Dose Route Frequency Provider Last Rate Last Admin   0.9 %   sodium chloride infusion  500 mL Intravenous Once Doran Stabler, MD         Allergies:  Allergies  Allergen Reactions   Penicillins Rash    Has patient had a PCN reaction causing immediate rash, facial/tongue/throat swelling, SOB or lightheadedness with hypotension: UnknowN Has patient had a PCN reaction causing severe rash involving mucus membranes or skin necrosis: UnknowN Has patient had a PCN reaction that required hospitalization: Unknown Has patient had a PCN reaction occurring within the last 10 years: /Unknown If all of the above answers are "NO", then may proceed with Cephalosporin use.       Physical Exam:  ECOG:  1    General appearance: Comfortable appearing without any discomfort Head: Normocephalic without any trauma Oropharynx: Mucous membranes are moist and pink without any thrush or ulcers. Eyes: Pupils are equal and round reactive to light. Lymph nodes: No cervical, supraclavicular, inguinal or axillary lymphadenopathy.   Heart:regular rate and rhythm.  S1 and S2 without leg edema. Lung: Clear without any rhonchi or wheezes.  No dullness to percussion. Abdomin: Soft, nontender, nondistended with good bowel sounds.  No hepatosplenomegaly. Musculoskeletal: No joint deformity or effusion.  Full range of motion noted. Neurological: No deficits noted on motor, sensory and deep tendon reflex exam. Skin: No petechial rash or dryness.  Appeared moist.  Psychiatric: Mood and affect appeared appropriate.    Lab Results: Lab Results  Component Value Date   WBC 4.4 02/07/2021   HGB 13.0 02/07/2021   HCT 38.6 02/07/2021   MCV 88 02/07/2021  PLT 242 02/07/2021     Chemistry      Component Value Date/Time   NA 143 02/07/2021 0936   K 4.4 02/07/2021 0936   CL 106 02/07/2021 0936   CO2 24 02/07/2021 0936   BUN 14 02/07/2021 0936   CREATININE 0.80 02/07/2021 0936      Component Value Date/Time   CALCIUM 9.3 02/07/2021 0936   ALKPHOS 53 02/07/2021 0936    AST 22 02/07/2021 0936   ALT 16 02/07/2021 0936   BILITOT 0.4 02/07/2021 0936        IMPRESSION: 1. Right-sided prostatic tracer uptake, most consistent with residual/recurrent disease. 2. No evidence of nodal or osseous uptake to suggest distant metastasis. 3. Incidental findings, including: Aortic atherosclerosis (ICD10-I70.0), coronary artery atherosclerosis and emphysema (ICD10-J43.9).   Impression and Plan:   70 year old man with:  1.  Prostate cancer diagnosed in 2017 with a Gleason score 4+4 = 8 and a PSA of 240.  He had received definitive therapy with radiation concluded in July 2018 and close to 2 years of androgen deprivation.  He developed biochemical relapse with a PSA up to 9.52 in February 2020.  PSMA PET scan revealed a right-sided prostate uptake indicating recurrent disease  His case was discussed again the prostate cancer multidisciplinary clinic including review of his pathology again with the reviewing pathologist as well as reviewing imaging studies with radiology.  Treatment options were discussed with the patient at this time.  Local salvage therapy remains best option at this time whether that will be in the form of additional radiation or surgical intervention.  The role for systemic therapy was discussed at this time the form of ADT as well as androgen receptor pathway inhibitors were reviewed.  I recommended deferring these options unless he has more spread of the disease that is documented.   All his questions were answered to his satisfaction.  2.  Follow-up: No regular medical oncology follow-up is scheduled for the time being but he is a likely high risk of developing metastatic disease in the future which was outlined for him today.   30  minutes were dedicated to this visit. The time was spent on reviewing laboratory data, imaging studies, discussing treatment options, and answering questions regarding future plan.    Zola Button,  MD 6/10/20228:51 AM

## 2021-06-05 ENCOUNTER — Encounter: Payer: Self-pay | Admitting: General Practice

## 2021-06-05 NOTE — Progress Notes (Signed)
[place] Psychosocial Distress Screening Spiritual Care  Left voicemail for [facility] following Prostate Multidisciplinary Clinic to introduce [place] team/resources, reviewing distress screen per protocol.  The patient scored a  0  on the Psychosocial Distress Thermometer which indicates  minimal  distress.   ONCBCN DISTRESS SCREENING 06/05/2021  Screening Type Initial Screening  Distress experienced in past week (1-10) 0  Referral to support programs Yes   Mr California received full packet of [place] team/programming information. Attempted to follow up by phone, leaving voicemail with encouragement to return call.  Follow up needed: No.   Chaplain Lorrin Jackson, [place], [facility] Pager [phone] Voicemail [phone]

## 2021-06-09 ENCOUNTER — Encounter: Payer: Self-pay | Admitting: Medical Oncology

## 2021-06-09 NOTE — Progress Notes (Signed)
Spoke with patient as follow up to [facility]. He currently does not have any questions. I asked if he has been scheduled for PSA lab drawn with Dr. Lynne Logan office. He states he has not heard from them and he is currently in [place]. I asked him to call [facility] for an appointment when he returns to town. He voiced understanding.

## 2021-07-26 ENCOUNTER — Encounter (HOSPITAL_COMMUNITY): Payer: Self-pay | Admitting: *Deleted

## 2021-07-26 ENCOUNTER — Ambulatory Visit (INDEPENDENT_AMBULATORY_CARE_PROVIDER_SITE_OTHER): Payer: Medicare Other

## 2021-07-26 ENCOUNTER — Other Ambulatory Visit: Payer: Self-pay

## 2021-07-26 ENCOUNTER — Ambulatory Visit (HOSPITAL_COMMUNITY)
Admission: EM | Admit: 2021-07-26 | Discharge: 2021-07-26 | Disposition: A | Discharge status: Home / Self Care | Payer: Medicare Other | Attending: Medical Oncology | Admitting: Medical Oncology

## 2021-07-26 DIAGNOSIS — M79675 Pain in left toe(s): Secondary | ICD-10-CM

## 2021-07-26 DIAGNOSIS — M7989 Other specified soft tissue disorders: Secondary | ICD-10-CM | POA: Diagnosis not present

## 2021-07-26 DIAGNOSIS — M7732 Calcaneal spur, left foot: Secondary | ICD-10-CM | POA: Diagnosis not present

## 2021-07-26 MED ORDER — MELOXICAM 7.5 MG PO TABS: 7.5000 mg | ORAL_TABLET | Freq: Every day | ORAL | 0 refills | Status: DC

## 2021-07-26 NOTE — ED Provider Notes (Signed)
[place]    CSN: [id number] Arrival date & time: 07/26/21  1537      History   Chief Complaint Chief Complaint  Patient presents with   Toe Pain   Nail Problem    HPI Jared Rhodes. is a 70 y.o. male.   HPI  Toe Pain: Patient reports that he has had a growth and pain of his left small toe for 1 week. Pain described as throbbing especially when he walks. Moderate to severe pain with walking. No known injuries. Growth does not bleed and does not have discharge. He has tried soaking the area with some improvement in pain.   Past Medical History:  Diagnosis Date   Hypertension    Prostate cancer ([place]) 2018   radation 40 treatments     Patient Active Problem List   Diagnosis Date Noted   Prostate cancer ([place]) 02/01/2017   Osteoarthritis of right hip 03/02/2016   Hip joint replacement status 03/02/2016   Alcohol intoxication ([place]) 07/17/2014   Type III open fracture dislocation of left ankle joint 07/17/2014   HTN (hypertension) 04/17/2014   Severe obesity (BMI >= 40) ([place]) 04/17/2014    Past Surgical History:  Procedure Laterality Date   I & D EXTREMITY Left 07/17/2014   Procedure: IRRIGATION AND DEBRIDEMENT EXTREMITY;  Surgeon: Marianna Payment, MD;  Location: [facility];  Service: Orthopedics;  Laterality: Left;   I & D EXTREMITY Left 07/19/2014   Procedure: IRRIGATION AND DEBRIDEMENT EXTREMITY;  Surgeon: Marianna Payment, MD;  Location: [facility];  Service: Orthopedics;  Laterality: Left;   ORIF ANKLE FRACTURE Left 07/19/2014   Procedure: OPEN REDUCTION INTERNAL FIXATION (ORIF) ANKLE FRACTURE;  Surgeon: Marianna Payment, MD;  Location: [facility];  Service: Orthopedics;  Laterality: Left;   PROSTATE BIOPSY     TONSILLECTOMY     TOTAL HIP ARTHROPLASTY Right 03/02/2016   Procedure: RIGHT TOTAL HIP ARTHROPLASTY ANTERIOR APPROACH;  Surgeon: Leandrew Koyanagi, MD;  Location: [facility];  Service: Orthopedics;  Laterality: Right;       Home Medications    Prior to  Admission medications   Medication Sig Start Date End Date Taking? Authorizing Provider  hydrochlorothiazide (HYDRODIURIL) 25 MG tablet Take 1 tablet (25 mg total) by mouth daily. May take and additional tablet for increase edema in feet. 02/28/21  Yes Kerin Perna, NP  losartan (COZAAR) 50 MG tablet Take 1 tablet by mouth daily. 04/20/21  Yes [provider]  amLODipine (NORVASC) 10 MG tablet TAKE 1 TABLET(10 MG) BY MOUTH DAILY 04/20/21   Kerin Perna, NP  Multiple Vitamins-Minerals (CENTRUM SILVER PO) Take 1 tablet by mouth daily.    [provider]    Family History Family History  Problem Relation Age of Onset   Alcohol abuse Mother    Heart disease Sister    Hyperlipidemia Brother    Colon cancer Neg Hx    Breast cancer Neg Hx    Prostate cancer Neg Hx    Pancreatic cancer Neg Hx     Social History Social History   Tobacco Use   Smoking status: Some Days    Packs/day: 0.50    Years: 30.00    Pack years: 15.00    Types: Cigarettes   Smokeless tobacco: Never  Vaping Use   Vaping Use: Never used  Substance Use Topics   Alcohol use: Yes    Alcohol/week: 12.0 standard drinks    Types: 12 Cans of beer per week  Comment: 12 beers per week per pt.    Drug use: No     Allergies   Penicillins   Review of Systems Review of Systems  As stated above in HPI Physical Exam Triage Vital Signs ED Triage Vitals  Enc Vitals Group     BP 07/26/21 1739 119/76     Pulse Rate 07/26/21 1739 80     Resp 07/26/21 1739 20     Temp 07/26/21 1739 98.4 F (36.9 C)     Temp src --      SpO2 07/26/21 1739 100 %     Weight --      Height --      Head Circumference --      Peak Flow --      Pain Score 07/26/21 1740 8     Pain Loc --      Pain Edu? --      Excl. in [place]? --    No data found.  Updated Vital Signs BP 119/76   Pulse 80   Temp 98.4 F (36.9 C)   Resp 20   SpO2 100%   Physical Exam Vitals and nursing note reviewed.   Constitutional:      General: He is not in acute distress.    Appearance: Normal appearance. He is not ill-appearing, toxic-appearing or diaphoretic.  Cardiovascular:     Pulses: Normal pulses.  Musculoskeletal:     Comments: There is a blueberry sized hyperkeratotic mass of the medial aspect of the fifth toe of the left foot.  No bleeding or discharge noted.  Range of motion of toe is affected due to the size of the mass.  There is some tenderness of the toe itself.  Evidence of tinea pedis of bilateral feet  Skin:    General: Skin is warm.  Neurological:     General: No focal deficit present.     Mental Status: He is alert.     Sensory: No sensory deficit.     UC Treatments / Results  Labs (all labs ordered are listed, but only abnormal results are displayed) Labs Reviewed - No data to display  EKG   Radiology No results found.  Procedures Procedures (including critical care time)  Medications Ordered in UC Medications - No data to display  Initial Impression / Assessment and Plan / UC Course  I have reviewed the triage vital signs and the nursing notes.  Pertinent labs & imaging results that were available during my care of the patient were reviewed by me and considered in my medical decision making (see chart for details).     New. X ray pending. Will need podiatry follow up for suspected SCC vs BCC.  X-ray shows some abnormalities which I discussed with patient.  We are going to give him a walking boot, write him a note for work and he will call podiatry to schedule his follow-up appointment.  Also sending in Mobic for discomfort.  Discussed how to use along with common potential side effects and precautions. Final Clinical Impressions(s) / UC Diagnoses   Final diagnoses:  None   Discharge Instructions   None    ED Prescriptions   None    PDMP not reviewed this encounter.   Hughie Closs, [place] 07/26/21 1940

## 2021-07-26 NOTE — ED Triage Notes (Signed)
Pt has swelling and pain to Lt small toe for on week.

## 2021-08-03 ENCOUNTER — Other Ambulatory Visit (INDEPENDENT_AMBULATORY_CARE_PROVIDER_SITE_OTHER): Payer: Self-pay | Admitting: Primary Care

## 2021-08-03 NOTE — Telephone Encounter (Signed)
Patient will need an office visit for further refills. Requested Prescriptions  Pending Prescriptions Disp Refills  . amLODipine (NORVASC) 10 MG tablet [Pharmacy Med Name: AMLODIPINE BESYLATE '10MG'$  TABLETS] 90 tablet 0    Sig: TAKE 1 TABLET(10 MG) BY MOUTH DAILY     Cardiovascular:  Calcium Channel Blockers Passed - 08/03/2021  6:45 AM      Passed - Last BP in normal range    BP Readings from Last 1 Encounters:  07/26/21 119/76         Passed - Valid encounter within last 6 months    Recent Outpatient Visits          5 months ago Primary hypertension   [place] Kerin Perna, NP   5 months ago Primary hypertension   [place] Kerin Perna, NP   7 years ago Health examination of defined subpopulation   Primary Care at [facility], [place], [place]

## 2021-08-10 DIAGNOSIS — M6281 Muscle weakness (generalized): Secondary | ICD-10-CM | POA: Diagnosis not present

## 2021-08-16 ENCOUNTER — Ambulatory Visit (INDEPENDENT_AMBULATORY_CARE_PROVIDER_SITE_OTHER): Payer: Medicare Other | Admitting: Podiatry

## 2021-08-16 ENCOUNTER — Other Ambulatory Visit: Payer: Self-pay

## 2021-08-16 DIAGNOSIS — L84 Corns and callosities: Secondary | ICD-10-CM

## 2021-08-16 NOTE — Progress Notes (Signed)
Subjective:  Patient ID: Jared Rhodes., male    DOB: 02/13/1951,  MRN: [id number]  Chief Complaint  Patient presents with   Toe Pain    Left foot 5th toe growth     70 y.o. male presents with the above complaint.  Patient presents with current signs of left fifth digit hyperkeratotic lesion/corn.  Patient states is painful to touch and has overgrown has progressed to gotten worse it hurts with tight shoes.  He has not seen anyone else prior to seeing me.  He went to urgent care to get it evaluated.  They did not see any fractures.  He states that he has been wearing surgical shoe to take the pressure off of it.  He has been soaking it as well.  He denies any other acute complaints.   Review of Systems: Negative except as noted in the HPI. Denies N/V/F/Ch.  Past Medical History:  Diagnosis Date   Hypertension    Prostate cancer ([place]) 2018   radation 40 treatments     Current Outpatient Medications:    amLODipine (NORVASC) 10 MG tablet, TAKE 1 TABLET(10 MG) BY MOUTH DAILY, Disp: 90 tablet, Rfl: 0   hydrochlorothiazide (HYDRODIURIL) 25 MG tablet, Take 1 tablet (25 mg total) by mouth daily. May take and additional tablet for increase edema in feet., Disp: 90 tablet, Rfl: 3   losartan (COZAAR) 50 MG tablet, Take 1 tablet by mouth daily., Disp: , Rfl:    meloxicam (MOBIC) 7.5 MG tablet, Take 1 tablet (7.5 mg total) by mouth daily., Disp: 30 tablet, Rfl: 0   Multiple Vitamins-Minerals (CENTRUM SILVER PO), Take 1 tablet by mouth daily., Disp: , Rfl:   Current Facility-Administered Medications:    0.9 %  sodium chloride infusion, 500 mL, Intravenous, Once, Danis, Kirke Corin, MD  Social History   Tobacco Use  Smoking Status Some Days   Packs/day: 0.50   Years: 30.00   Pack years: 15.00   Types: Cigarettes  Smokeless Tobacco Never    Allergies  Allergen Reactions   Penicillins Rash    Has patient had a PCN reaction causing immediate rash, facial/tongue/throat swelling, SOB  or lightheadedness with hypotension: UnknowN Has patient had a PCN reaction causing severe rash involving mucus membranes or skin necrosis: UnknowN Has patient had a PCN reaction that required hospitalization: Unknown Has patient had a PCN reaction occurring within the last 10 years: /Unknown If all of the above answers are "NO", then may proceed with Cephalosporin use.    Objective:  There were no vitals filed for this visit. There is no height or weight on file to calculate BMI. Constitutional Well developed. Well nourished.  Vascular Dorsalis pedis pulses palpable bilaterally. Posterior tibial pulses palpable bilaterally. Capillary refill normal to all digits.  No cyanosis or clubbing noted. Pedal hair growth normal.  Neurologic Normal speech. Oriented to person, place, and time. Epicritic sensation to light touch grossly present bilaterally.  Dermatologic Hyperkeratotic lesion noted in between the fourth and fifth digit with primary lesion on the fifth digit.  Pain on palpation to the lesion.  No pain with range of motion of the MPJ of the fifth or the fourth.  Hammertoe contractures noted of both digits fourth and fifth semiflexible in nature adductovarus component of the fifth digit noted.  Orthopedic: Normal joint ROM without pain or crepitus bilaterally. No visible deformities. No bony tenderness.   Radiographs: None Assessment:   1. Heloma molle    Plan:  Patient was evaluated and  treated and all questions answered.  Left fifth digit heloma molle with underlying adductovarus hammertoe deformity -I explained the patient the etiology of heloma molle and various treatment options were discussed.  I discussed with him the importance of shoe gear modification extensive detail.  Using chisel blade to handle the lesion was debrided down to healthy striated tissue.  No ulceration noted no complication noted no signs of infection noted.  I discussed with him to use spacers and  protectors and make changes to his shoes.  He states understand will do so if there is any foot and ankle issues arise in future asked him to come see me.  He states understanding  No follow-ups on file.

## 2021-08-23 ENCOUNTER — Other Ambulatory Visit: Payer: Self-pay | Admitting: Urology

## 2021-08-23 DIAGNOSIS — M6281 Muscle weakness (generalized): Secondary | ICD-10-CM | POA: Diagnosis not present

## 2021-08-23 DIAGNOSIS — M62838 Other muscle spasm: Secondary | ICD-10-CM | POA: Diagnosis not present

## 2021-09-19 NOTE — Progress Notes (Addendum)
COVID swab appointment: 09/21/21  COVID Vaccine Completed: yes x2 Date COVID Vaccine completed: Has received booster: COVID vaccine manufacturer: [employer]   Date of COVID positive in last 90 days: No  PCP - Juluis Mire, NP Cardiologist - N/a  Chest x-ray - PET 03/31/21 Epic EKG - 09/22/21 Stress Test - a long time ago per pt ECHO - N/a Cardiac Cath - N/a Pacemaker/ICD device last checked:N/a Spinal Cord Stimulator: N/a  Sleep Study - N/a CPAP -   Fasting Blood Sugar - N/a Checks Blood Sugar _____ times a day  Blood Thinner Instructions: N/a Aspirin Instructions: Last Dose:  Activity level: Can go up a flight of stairs and perform activities of daily living without stopping and without symptoms of chest pain or shortness of breath.    Anesthesia review:   Patient denies shortness of breath, fever, cough and chest pain at PAT appointment   Patient verbalized understanding of instructions that were given to them at the PAT appointment. Patient was also instructed that they will need to review over the PAT instructions again at home before surgery.

## 2021-09-19 NOTE — Patient Instructions (Addendum)
DUE TO COVID-19 ONLY ONE VISITOR IS ALLOWED TO COME WITH YOU AND STAY IN THE WAITING ROOM ONLY DURING PRE OP AND PROCEDURE.   **NO VISITORS ARE ALLOWED IN THE SHORT STAY AREA OR RECOVERY ROOM!!**  IF YOU WILL BE ADMITTED INTO THE HOSPITAL YOU ARE ALLOWED ONLY TWO SUPPORT PEOPLE DURING VISITATION HOURS ONLY (10AM -8PM)   The support person(s) may change daily. The support person(s) must pass our screening, gel in and out, and wear a mask at all times, including in the patient's room. Patients must also wear a mask when staff or their support person are in the room.  No visitors under the age of 38. Any visitor under the age of 8 must be accompanied by an adult.    COVID SWAB TESTING MUST BE COMPLETED ON:  09/22/21 **MUST PRESENT COMPLETED FORM AT TESTING SITE**    [place] (backside of the building) Open 8am-3pm. No appointment needed. You are not required to quarantine, however you are required to wear a well-fitted mask when you are out and around people not in your household.  Hand Hygiene often Do NOT share personal items Notify your provider if you are in close contact with someone who has COVID or you develop fever 100.4 or greater, new onset of sneezing, cough, sore throat, shortness of breath or body aches.       Your procedure is scheduled on: 09/25/21   Report to [facility] Main Entrance   Report to Short Stay at 5:15 AM   [facility])   Call this number if you have problems the morning of surgery [phone]   Do not eat food :After Midnight.   May have liquids until 4:15 AM day of surgery  CLEAR LIQUID DIET  Foods Allowed                                                                     Foods Excluded  Water, Black Coffee and tea (no milk or creamer)           liquids that you cannot  Plain Jell-O in any flavor  (No red)                                    see through such as: Fruit ices (not with fruit pulp)                                             milk, soups, orange juice              Iced Popsicles (No red)                                               All solid food  Apple juices Sports drinks like Gatorade (No red) Lightly seasoned clear broth or consume(fat free) Sugar   Oral Hygiene is also important to reduce your risk of infection.                                    Remember - BRUSH YOUR TEETH THE MORNING OF SURGERY WITH YOUR REGULAR TOOTHPASTE   Do NOT smoke after Midnight   Take these medicines the morning of surgery with A SIP OF WATER: Amlodipine                              You may not have any metal on your body including jewelry, and body piercing             Do not wear lotions, powders, cologne, or deodorant              Men may shave face and neck.   Do not bring valuables to the hospital. Jared Rhodes.   Bring small overnight bag day of surgery.  Please read over the following fact sheets you were given: IF YOU HAVE QUESTIONS ABOUT YOUR PRE-OP INSTRUCTIONS PLEASE CALL [phone]   [facility] - Preparing for Surgery Before surgery, you can play an important role.  Because skin is not sterile, your skin needs to be as free of germs as possible.  You can reduce the number of germs on your skin by washing with CHG (chlorahexidine gluconate) soap before surgery.  CHG is an antiseptic cleaner which kills germs and bonds with the skin to continue killing germs even after washing. Please DO NOT use if you have an allergy to CHG or antibacterial soaps.  If your skin becomes reddened/irritated stop using the CHG and inform your nurse when you arrive at Short Stay. Do not shave (including legs and underarms) for at least 48 hours prior to the first CHG shower.  You may shave your face/neck.  Please follow these instructions carefully:  1.  Shower with CHG Soap the night before surgery and the  morning of  surgery.  2.  If you choose to wash your hair, wash your hair first as usual with your normal  shampoo.  3.  After you shampoo, rinse your hair and body thoroughly to remove the shampoo.                             4.  Use CHG as you would any other liquid soap.  You can apply chg directly to the skin and wash.  Gently with a scrungie or clean washcloth.  5.  Apply the CHG Soap to your body ONLY FROM THE NECK DOWN.   Do   not use on face/ open                           Wound or open sores. Avoid contact with eyes, ears mouth and   genitals (private parts).                       Wash face,  Genitals (private parts) with your normal soap.  6.  Wash thoroughly, paying special attention to the area where your    surgery  will be performed.  7.  Thoroughly rinse your body with warm water from the neck down.  8.  DO NOT shower/wash with your normal soap after using and rinsing off the CHG Soap.                9.  Pat yourself dry with a clean towel.            10.  Wear clean pajamas.            11.  Place clean sheets on your bed the night of your first shower and do not  sleep with pets. Day of Surgery : Do not apply any lotions/deodorants the morning of surgery.  Please wear clean clothes to the hospital/surgery center.  FAILURE TO FOLLOW THESE INSTRUCTIONS MAY RESULT IN THE CANCELLATION OF YOUR SURGERY  PATIENT SIGNATURE_________________________________  NURSE SIGNATURE__________________________________  ________________________________________________________________________   Jared Rhodes  An incentive spirometer is a tool that can help keep your lungs clear and active. This tool measures how well you are filling your lungs with each breath. Taking long deep breaths may help reverse or decrease the chance of developing breathing (pulmonary) problems (especially infection) following: A long period of time when you are unable to move or be active. BEFORE THE PROCEDURE   If the spirometer includes an indicator to show your best effort, your nurse or respiratory therapist will set it to a desired goal. If possible, sit up straight or lean slightly forward. Try not to slouch. Hold the incentive spirometer in an upright position. INSTRUCTIONS FOR USE  Sit on the edge of your bed if possible, or sit up as far as you can in bed or on a chair. Hold the incentive spirometer in an upright position. Breathe out normally. Place the mouthpiece in your mouth and seal your lips tightly around it. Breathe in slowly and as deeply as possible, raising the piston or the ball toward the top of the column. Hold your breath for 3-5 seconds or for as long as possible. Allow the piston or ball to fall to the bottom of the column. Remove the mouthpiece from your mouth and breathe out normally. Rest for a few seconds and repeat Steps 1 through 7 at least 10 times every 1-2 hours when you are awake. Take your time and take a few normal breaths between deep breaths. The spirometer may include an indicator to show your best effort. Use the indicator as a goal to work toward during each repetition. After each set of 10 deep breaths, practice coughing to be sure your lungs are clear. If you have an incision (the cut made at the time of surgery), support your incision when coughing by placing a pillow or rolled up towels firmly against it. Once you are able to get out of bed, walk around indoors and cough well. You may stop using the incentive spirometer when instructed by your caregiver.  RISKS AND COMPLICATIONS Take your time so you do not get dizzy or light-headed. If you are in pain, you may need to take or ask for pain medication before doing incentive spirometry. It is harder to take a deep breath if you are having pain. AFTER USE Rest and breathe slowly and easily. It can be helpful to keep track of a log of your progress. Your caregiver can provide you with a simple table to help  with this.  If you are using the spirometer at home, follow these instructions: [employer] IF:  You are having difficultly using the spirometer. You have trouble using the spirometer as often as instructed. Your pain medication is not giving enough relief while using the spirometer. You develop fever of 100.5 F (38.1 C) or higher. SEEK IMMEDIATE MEDICAL CARE IF:  You cough up bloody sputum that had not been present before. You develop fever of 102 F (38.9 C) or greater. You develop worsening pain at or near the incision site. MAKE SURE YOU:  Understand these instructions. Will watch your condition. Will get help right away if you are not doing well or get worse. Document Released: 04/22/2007 Document Revised: 03/03/2012 Document Reviewed: 06/23/2007 Exit[employer] Patient Information 2014 [employer], [place].   ________________________________________________________________________  WHAT IS A BLOOD TRANSFUSION? Blood Transfusion Information  A transfusion is the replacement of blood or some of its parts. Blood is made up of multiple cells which provide different functions. Red blood cells carry oxygen and are used for blood loss replacement. White blood cells fight against infection. Platelets control bleeding. Plasma helps clot blood. Other blood products are available for specialized needs, such as hemophilia or other clotting disorders. BEFORE THE TRANSFUSION  Who gives blood for transfusions?  Healthy volunteers who are fully evaluated to make sure their blood is safe. This is blood bank blood. Transfusion therapy is the safest it has ever been in the practice of medicine. Before blood is taken from a donor, a complete history is taken to make sure that person has no history of diseases nor engages in risky social behavior (examples are intravenous drug use or sexual activity with multiple partners). The donor's travel history is screened to minimize risk of transmitting  infections, such as malaria. The donated blood is tested for signs of infectious diseases, such as HIV and hepatitis. The blood is then tested to be sure it is compatible with you in order to minimize the chance of a transfusion reaction. If you or a relative donates blood, this is often done in anticipation of surgery and is not appropriate for emergency situations. It takes many days to process the donated blood. RISKS AND COMPLICATIONS Although transfusion therapy is very safe and saves many lives, the main dangers of transfusion include:  Getting an infectious disease. Developing a transfusion reaction. This is an allergic reaction to something in the blood you were given. Every precaution is taken to prevent this. The decision to have a blood transfusion has been considered carefully by your caregiver before blood is given. Blood is not given unless the benefits outweigh the risks. AFTER THE TRANSFUSION Right after receiving a blood transfusion, you will usually feel much better and more energetic. This is especially true if your red blood cells have gotten low (anemic). The transfusion raises the level of the red blood cells which carry oxygen, and this usually causes an energy increase. The nurse administering the transfusion will monitor you carefully for complications. HOME CARE INSTRUCTIONS  No special instructions are needed after a transfusion. You may find your energy is better. Speak with your caregiver about any limitations on activity for underlying diseases you may have. SEEK MEDICAL CARE IF:  Your condition is not improving after your transfusion. You develop redness or irritation at the intravenous (IV) site. SEEK IMMEDIATE MEDICAL CARE IF:  Any of the following symptoms occur over the next 12 hours: Shaking chills. You have a temperature by mouth above 102 F (38.9 C), not  controlled by medicine. Chest, back, or muscle pain. People around you feel you are not acting correctly  or are confused. Shortness of breath or difficulty breathing. Dizziness and fainting. You get a rash or develop hives. You have a decrease in urine output. Your urine turns a dark color or changes to pink, red, or brown. Any of the following symptoms occur over the next 10 days: You have a temperature by mouth above 102 F (38.9 C), not controlled by medicine. Shortness of breath. Weakness after normal activity. The white part of the eye turns yellow (jaundice). You have a decrease in the amount of urine or are urinating less often. Your urine turns a dark color or changes to pink, red, or brown. Document Released: 12/07/2000 Document Revised: 03/03/2012 Document Reviewed: 07/26/2008 [facility] Patient Information 2014 [place], [place].  _______________________________________________________________________

## 2021-09-20 ENCOUNTER — Encounter (HOSPITAL_COMMUNITY): Payer: Self-pay

## 2021-09-20 ENCOUNTER — Encounter (HOSPITAL_COMMUNITY)
Admission: RE | Admit: 2021-09-20 | Discharge: 2021-09-20 | Disposition: A | Discharge status: Home / Self Care | Payer: Medicare Other | Source: Ambulatory Visit | Attending: Urology | Admitting: Urology

## 2021-09-20 ENCOUNTER — Other Ambulatory Visit: Payer: Self-pay

## 2021-09-20 NOTE — Progress Notes (Signed)
Patient not aware he had an appointment today. Interview completed over the phone. He will come in 09/22/21 for labs and COVID test.

## 2021-09-22 ENCOUNTER — Other Ambulatory Visit: Payer: Self-pay | Admitting: Urology

## 2021-09-22 ENCOUNTER — Encounter (HOSPITAL_COMMUNITY)
Admission: RE | Admit: 2021-09-22 | Discharge: 2021-09-22 | Disposition: A | Discharge status: Home / Self Care | Payer: Medicare Other | Source: Ambulatory Visit | Attending: Urology | Admitting: Urology

## 2021-09-22 ENCOUNTER — Ambulatory Visit (INDEPENDENT_AMBULATORY_CARE_PROVIDER_SITE_OTHER): Payer: Medicare Other | Admitting: Primary Care

## 2021-09-22 DIAGNOSIS — Z79899 Other long term (current) drug therapy: Secondary | ICD-10-CM | POA: Diagnosis not present

## 2021-09-22 DIAGNOSIS — Z01812 Encounter for preprocedural laboratory examination: Secondary | ICD-10-CM | POA: Insufficient documentation

## 2021-09-22 DIAGNOSIS — C61 Malignant neoplasm of prostate: Secondary | ICD-10-CM | POA: Insufficient documentation

## 2021-09-22 LAB — BASIC METABOLIC PANEL
Anion gap: 6 (ref 5–15)
BUN: 18 mg/dL (ref 8–23)
CO2: 25 mmol/L (ref 22–32)
Calcium: 9.2 mg/dL (ref 8.9–10.3)
Chloride: 114 mmol/L — ABNORMAL HIGH (ref 98–111)
Creatinine, Ser: 0.81 mg/dL (ref 0.61–1.24)
GFR, Estimated: >60 mL/min (ref >60)
Glucose, Bld: 90 mg/dL (ref 70–99)
Potassium: 4.1 mmol/L (ref 3.5–5.1)
Sodium: 145 mmol/L (ref 135–145)

## 2021-09-22 LAB — CBC
HCT: 36.8 % — ABNORMAL LOW (ref 39.0–52.0)
Hemoglobin: 11.9 g/dL — ABNORMAL LOW (ref 13.0–17.0)
MCH: 30.2 pg (ref 26.0–34.0)
MCHC: 32.3 g/dL (ref 30.0–36.0)
MCV: 93.4 fL (ref 80.0–100.0)
Platelets: 218 10*3/uL (ref 150–400)
RBC: 3.94 MIL/uL — ABNORMAL LOW (ref 4.22–5.81)
RDW: 14.4 % (ref 11.5–15.5)
WBC: 3.9 10*3/uL — ABNORMAL LOW (ref 4.0–10.5)
nRBC: 0 % (ref 0.0–0.2)

## 2021-09-22 NOTE — Progress Notes (Addendum)
PT EKG was missed at preop lab appt.  PEr Shawn Stall EKG can be completed am of surgery.  ORder in for ekg to be done am of surgery.  Vitals were not completed at preop appt.  Nurse was not aware until after pt had left preop appt.

## 2021-09-22 NOTE — H&P (Signed)
CC: Prostate Cancer    Jared Rhodes is a 70 year old gentleman with a history of hypertension who presented to me in November 2017 with an elevated PSA of 238.8 and a suspicious prostate exam with nodularity of the left side of the prostate. He underwent a TRUS biopsy of the prostate on 12/07/16 that confirmed Gleason 4+4=8 adenocarcinoma of the prostate with 11 out of 12 biopsy cores positive for malignancy. Conventional imaging at that time with a bone scan and CT scan were negative for metastatic disease. He was seen in the multidisciplinary clinic at that time and proceeded with long term ADT and EBRT from April through July 2018. Although he did have some problems with non-compliance, he completed about 18 months of androgen deprivation in October of 2019 with a PSA nadir 0.47 in October 2019. His PSA has since been steadily rising to 1.2 in March 2020, 3.7 in April 2021 and 9.52 in February 2022. He has continued to be somewhat non-compliant.  He did have a PSMA PET scan on 03/31/21 that demonstrated right side uptake within the prostate but without evidence of metastatic disease.   PMH: Hypertension.  PSH: No abdominal surgeries.   Urinary function: IPSS is 1.  Erectile function: SHIM score is 14.     ALLERGIES: penicillin     MEDICATIONS: Hydrochlorothiazide 25 mg tablet  Amlodipine Besylate 10 mg tablet  Multiple Vitamin  Sildenafil Citrate 20 mg tablet Take 2-5 tablets PO Daily prn 30-60 minutes before planned activity.     GU PSH: Locm 300-399Mg /Ml Iodine,1Ml - 2018 PLACE RT DEVICE/MARKER, PROS - 2018 Prostate Needle Biopsy - 2017     NON-GU PSH: Hip Arthroscopy/surgery, Right Surgical Pathology, Gross And Microscopic Examination For Prostate Needle - 2017     GU PMH: ED due to arterial insufficiency - 02/20/2021, - 2019 Prostate Cancer - 02/20/2021 Rising PSA after prostate cancer treatment - 02/20/2021      PMH Notes:   1) High risk prostate cancer: He was diagnosed  with cT2b N0 M0, Gleason 4+4=8 adenocarcinoma with a pretreatment PSA of 238. His staging studies were negative and he proceeded with treatment with long term ADT and radiation therapy. He completed radiation under the care of Dr. Tammi Klippel from Apr-Jul 2018. He was somewhat non-compliant with some of his radiation visits but was able to receive 75 Gy to the prostate and 45 Gy to the pelvis. He continued to be non-compliant with follow up but was noted to have evidence of biochemical recurrence in April 2021.   Feb 2018: Began ADT  Apr-Jul 2018: EBRT  Oct 2019: Stopped ADT  Apr 2021: BCR   NON-GU PMH: Hypertension    FAMILY HISTORY: 12 daughters - Runs in Family 2 sons - Runs in Family Diabetes - Father liver disease - Mother   SOCIAL HISTORY: Marital Status: Married Preferred Language: English; Ethnicity: Not Hispanic Or Latino; Race: Black or African American Current Smoking Status: Patient smokes occasionally.   Tobacco Use Assessment Completed: Used Tobacco in last 30 days? Does drink.  Does not drink caffeine.    REVIEW OF SYSTEMS:    GU Review Male:   Patient denies frequent urination, hard to postpone urination, burning/ pain with urination, get up at night to urinate, leakage of urine, stream starts and stops, trouble starting your streams, and have to strain to urinate .  Gastrointestinal (Lower):   Patient denies diarrhea and constipation.  Gastrointestinal (Upper):   Patient denies nausea and vomiting.  Constitutional:  Patient denies fever, night sweats, weight loss, and fatigue.  Skin:   Patient denies skin rash/ lesion and itching.  Eyes:   Patient denies blurred vision and double vision.  Ears/ Nose/ Throat:   Patient denies sore throat and sinus problems.  Hematologic/Lymphatic:   Patient denies swollen glands and easy bruising.  Cardiovascular:   Patient denies leg swelling and chest pains.  Respiratory:   Patient denies cough and shortness of breath.  Endocrine:    Patient denies excessive thirst.  Musculoskeletal:   Patient denies back pain and joint pain.  Neurological:   Patient denies headaches and dizziness.  Psychologic:   Patient denies depression and anxiety.    MULTI-SYSTEM PHYSICAL EXAMINATION:    Constitutional: Well-nourished. No physical deformities. Normally developed. Good grooming.  Neck: Neck symmetrical, not swollen. Normal tracheal position.  Respiratory: No labored breathing, no use of accessory muscles. Clear bilaterally.   Cardiovascular: Normal temperature, normal extremity pulses, no swelling, no varicosities. regular rate and rhythm.  Lymphatic: No enlargement of neck, axillae, groin.  Skin: No paleness, no jaundice, no cyanosis. No lesion, no ulcer, no rash.  Neurologic / Psychiatric: Oriented to time, oriented to place, oriented to person. No depression, no anxiety, no agitation.  Gastrointestinal: No mass, no tenderness, no rigidity, non obese abdomen.  Eyes: Normal conjunctivae. Normal eyelids.  Ears, Nose, Mouth, and Throat: Left ear no scars, no lesions, no masses. Right ear no scars, no lesions, no masses. Nose no scars, no lesions, no masses. Normal hearing. Normal lips.  Musculoskeletal: Normal gait and station of head and neck.     Complexity of Data:  Lab Test Review:   PSA  Records Review:   Pathology Reports, Previous Patient Records  X-Ray Review: PET Scan: Reviewed Films.     02/20/21 04/08/20 03/19/19 10/09/18 03/31/18 09/25/17 01/22/17  PSA  Total PSA 9.52 ng/mL 3.7 ng/ml 1.195 ng/ml 0.47 ng/mL 0.78 ng/mL 4.72 ng/mL 178.00 ng/dl    10/09/18  Hormones  Testosterone, Total 22.4 ng/dL   Notes:                     CLINICAL DATA: Status post radiation therapy for prostate cancer.  Rising PSA. Current PSA 9.5.   EXAM:  NUCLEAR MEDICINE PET SKULL BASE TO THIGH   TECHNIQUE:  8.4 mCi F18 Piflufolastat (Pylarify) was injected intravenously.  Full-ring PET imaging was performed from the skull base to thigh   after the radiotracer. CT data was obtained and used for attenuation  correction and anatomic localization.   COMPARISON: 11/18/2018 abdominopelvic CT.   FINDINGS:  NECK   No radiotracer activity in neck lymph nodes.   Incidental CT finding: No cervical adenopathy.   CHEST   No pulmonary parenchymal or thoracic nodal hypermetabolism.   Incidental CT finding: Aortic and minimal coronary artery  calcification. Right hemidiaphragm elevation. Centrilobular and  paraseptal emphysema, mild.   ABDOMEN/PELVIS   Prostate: Radiation seeds within the prostate. Foci of tracer  avidity including within the right lateral mid and apical gland.  These measure a S.U.V. max of 7.2 and 6.7 respectively.   Lymph nodes: No abnormal radiotracer accumulation within pelvic or  abdominal nodes.   Liver: No evidence of liver metastasis   Incidental CT finding: Normal adrenal glands. No renal calculi or  hydronephrosis. Abdominal aortic atherosclerosis. No abdominopelvic  adenopathy. Degraded evaluation of the pelvis, secondary to beam  hardening artifact from right hip arthroplasty.   SKELETON   No abnormal marrow activity. No focal  osseous lesion.   IMPRESSION:  1. Right-sided prostatic tracer uptake, most consistent with  residual/recurrent disease.  2. No evidence of nodal or osseous uptake to suggest distant  metastasis.  3. Incidental findings, including: Aortic atherosclerosis  (ICD10-I70.0), coronary artery atherosclerosis and emphysema  (ICD10-J43.9).    Electronically Signed  By: Abigail Miyamoto M.D.  On: 04/03/2021 11:20     ASSESSMENT:      ICD-10 Details  1 GU:   Prostate Cancer - C61   2   Rising PSA after prostate cancer treatment - R97.21    PLAN:      1. Biochemically recurrent prostate cancer: I had a detailed discussion with Mr. [place]. We have reviewed his PSMA PET scan in the multidisciplinary GU tumor board conference earlier this morning. He appears to have  evidence of local recurrence without evidence of distant metastases. He has not yet had a repeat PSA since his last PSA in February. After reviewing options for treatment, he does feel adamant that he wishes to optimize his chance for long-term survival. He therefore is most interested in salvage therapy for curative intent rather than systemic therapy. Considering his PSMA PET scan results, this would appear to be reasonable. However, I would like him to get a repeat PSA to ensure that he does not have an extremely rapid PSA doubling time. If reasonable, I will then recommend that he proceed with a prostate biopsy to definitively confirm local recurrence prior to proceeding with salvage curative treatment. We have reviewed options for salvage local therapy including surgical therapy with salvage radical prostatectomy vs. ablative therapies. We have reviewed the pros and cons of both approaches as well as the side effect profiles of both approaches. Ultimately, he is interested in a salvage prostatectomy.   We discussed surgical therapy for prostate cancer including the different available surgical approaches. We discussed, in detail, the risks and expectations of surgery with regard to cancer control, urinary control, and erectile function as well as the expected postoperative recovery process. Additional risks of surgery including but not limited to bleeding, infection, hernia formation, nerve damage, lymphocele formation, bowel/rectal injury potentially necessitating colostomy, damage to the urinary tract resulting in urine leakage, urethral stricture, and the cardiopulmonary risks such as myocardial infarction, stroke, death, venothromboembolism, etc. were explained. The risk of open surgical conversion for robotic/laparoscopic prostatectomy was also discussed.   Even understanding the increased risk for urinary incontinence and almost certain erectile dysfunction, he does wish to optimize his long-term  survival therefore is interested in proceeding with a salvage prostatectomy if appropriate. Pending his PSA result, he will then be scheduled for a prostate biopsy to confirm local recurrence. If present, he would then be scheduled for a non nerve-sparing robot assisted laparoscopic radical prostatectomy and bilateral pelvic lymphadenectomy.   Considering his history of noncompliance, I have reiterated the importance of ongoing compliance for him to have the best possible outcome.   Addendum:  PSA 12.2. We discussed result and he remains interested in salvage local therapy with a possible salvage prostatectomy. Will proceed with a biopsy to confirm local recurrence.

## 2021-09-25 ENCOUNTER — Observation Stay (HOSPITAL_COMMUNITY)
Admission: RE | Admit: 2021-09-25 | Discharge: 2021-09-26 | Disposition: A | Discharge status: Home / Self Care | Payer: Medicare Other | Source: Ambulatory Visit | Attending: Urology | Admitting: Urology

## 2021-09-25 ENCOUNTER — Ambulatory Visit (HOSPITAL_COMMUNITY): Payer: Medicare Other | Admitting: Registered Nurse

## 2021-09-25 ENCOUNTER — Encounter (HOSPITAL_COMMUNITY): Payer: Self-pay | Admitting: Urology

## 2021-09-25 ENCOUNTER — Encounter (HOSPITAL_COMMUNITY)
Admission: RE | Disposition: A | Discharge status: Home / Self Care | Payer: Self-pay | Source: Ambulatory Visit | Attending: Urology

## 2021-09-25 ENCOUNTER — Other Ambulatory Visit: Payer: Self-pay

## 2021-09-25 DIAGNOSIS — Z79899 Other long term (current) drug therapy: Secondary | ICD-10-CM | POA: Insufficient documentation

## 2021-09-25 DIAGNOSIS — D630 Anemia in neoplastic disease: Secondary | ICD-10-CM | POA: Diagnosis not present

## 2021-09-25 DIAGNOSIS — C61 Malignant neoplasm of prostate: Principal | ICD-10-CM | POA: Diagnosis present

## 2021-09-25 DIAGNOSIS — I10 Essential (primary) hypertension: Secondary | ICD-10-CM | POA: Insufficient documentation

## 2021-09-25 HISTORY — PX: LYMPHADENECTOMY: SHX5960

## 2021-09-25 HISTORY — PX: ROBOT ASSISTED LAPAROSCOPIC RADICAL PROSTATECTOMY: SHX5141

## 2021-09-25 LAB — TYPE AND SCREEN
ABO/RH(D): A POS
Antibody Screen: NEGATIVE

## 2021-09-25 LAB — HEMOGLOBIN AND HEMATOCRIT, BLOOD
HCT: 37.6 % — ABNORMAL LOW (ref 39.0–52.0)
Hemoglobin: 12.4 g/dL — ABNORMAL LOW (ref 13.0–17.0)

## 2021-09-25 SURGERY — XI ROBOTIC ASSISTED LAPAROSCOPIC RADICAL PROSTATECTOMY LEVEL 3
Anesthesia: General

## 2021-09-25 MED ORDER — SUFENTANIL CITRATE 50 MCG/ML IV SOLN
INTRAVENOUS | Status: AC
  Filled 2021-09-25: qty 1

## 2021-09-25 MED ORDER — DEXMEDETOMIDINE (PRECEDEX) IN NS 20 MCG/5ML (4 MCG/ML) IV SYRINGE
PREFILLED_SYRINGE | INTRAVENOUS | Status: DC | PRN
  Administered 2021-09-25: 4 ug via INTRAVENOUS

## 2021-09-25 MED ORDER — MIDAZOLAM HCL 2 MG/2ML IJ SOLN
INTRAMUSCULAR | Status: AC
  Filled 2021-09-25: qty 2

## 2021-09-25 MED ORDER — DOCUSATE SODIUM 100 MG PO CAPS
100.0000 mg | ORAL_CAPSULE | Freq: Two times a day (BID) | ORAL | Status: DC
  Administered 2021-09-25 – 2021-09-26 (×2): 100 mg via ORAL
  Filled 2021-09-25 (×3): qty 1

## 2021-09-25 MED ORDER — FLEET ENEMA 7-19 GM/118ML RE ENEM: 1.0000 | ENEMA | Freq: Once | RECTAL | Status: DC

## 2021-09-25 MED ORDER — ONDANSETRON HCL 4 MG/2ML IJ SOLN
INTRAMUSCULAR | Status: DC | PRN
  Administered 2021-09-25: 4 mg via INTRAVENOUS

## 2021-09-25 MED ORDER — DOCUSATE SODIUM 100 MG PO CAPS: 100.0000 mg | ORAL_CAPSULE | Freq: Two times a day (BID) | ORAL | Status: DC

## 2021-09-25 MED ORDER — CHLORHEXIDINE GLUCONATE 0.12 % MT SOLN
15.0000 mL | Freq: Once | OROMUCOSAL | Status: AC
  Administered 2021-09-25: 15 mL via OROMUCOSAL

## 2021-09-25 MED ORDER — ONDANSETRON HCL 4 MG/2ML IJ SOLN
INTRAMUSCULAR | Status: AC
  Filled 2021-09-25: qty 2

## 2021-09-25 MED ORDER — HEPARIN SODIUM (PORCINE) 1000 UNIT/ML IJ SOLN
INTRAMUSCULAR | Status: AC
  Filled 2021-09-25: qty 1

## 2021-09-25 MED ORDER — PROPOFOL 10 MG/ML IV BOLUS
INTRAVENOUS | Status: AC
  Filled 2021-09-25: qty 40

## 2021-09-25 MED ORDER — DEXMEDETOMIDINE (PRECEDEX) IN NS 20 MCG/5ML (4 MCG/ML) IV SYRINGE
PREFILLED_SYRINGE | INTRAVENOUS | Status: AC
  Filled 2021-09-25: qty 5

## 2021-09-25 MED ORDER — MIDAZOLAM HCL 5 MG/5ML IJ SOLN
INTRAMUSCULAR | Status: DC | PRN
  Administered 2021-09-25: 2 mg via INTRAVENOUS

## 2021-09-25 MED ORDER — SUGAMMADEX SODIUM 500 MG/5ML IV SOLN
INTRAVENOUS | Status: DC | PRN
  Administered 2021-09-25: 300 mg via INTRAVENOUS

## 2021-09-25 MED ORDER — HYDRALAZINE HCL 20 MG/ML IJ SOLN
INTRAMUSCULAR | Status: AC
  Filled 2021-09-25: qty 1

## 2021-09-25 MED ORDER — EPHEDRINE 5 MG/ML INJ
INTRAVENOUS | Status: AC
  Filled 2021-09-25: qty 5

## 2021-09-25 MED ORDER — ONDANSETRON HCL 4 MG/2ML IJ SOLN: 4.0000 mg | Freq: Once | INTRAMUSCULAR | Status: DC | PRN

## 2021-09-25 MED ORDER — DIPHENHYDRAMINE HCL 50 MG/ML IJ SOLN: 12.5000 mg | Freq: Four times a day (QID) | INTRAMUSCULAR | Status: DC | PRN

## 2021-09-25 MED ORDER — TRAMADOL HCL 50 MG PO TABS: 50.0000 mg | ORAL_TABLET | Freq: Four times a day (QID) | ORAL | 0 refills | Status: DC | PRN

## 2021-09-25 MED ORDER — ZOLPIDEM TARTRATE 5 MG PO TABS: 5.0000 mg | ORAL_TABLET | Freq: Every evening | ORAL | Status: DC | PRN

## 2021-09-25 MED ORDER — EPHEDRINE SULFATE-NACL 50-0.9 MG/10ML-% IV SOSY
PREFILLED_SYRINGE | INTRAVENOUS | Status: DC | PRN
  Administered 2021-09-25: 5 mg via INTRAVENOUS

## 2021-09-25 MED ORDER — SUFENTANIL CITRATE 50 MCG/ML IV SOLN
INTRAVENOUS | Status: DC | PRN
  Administered 2021-09-25: 10 ug via INTRAVENOUS
  Administered 2021-09-25: 15 ug via INTRAVENOUS
  Administered 2021-09-25 (×2): 5 ug via INTRAVENOUS
  Administered 2021-09-25: 10 ug via INTRAVENOUS
  Administered 2021-09-25: 5 ug via INTRAVENOUS

## 2021-09-25 MED ORDER — HYDROMORPHONE HCL 2 MG/ML IJ SOLN
INTRAMUSCULAR | Status: AC
  Filled 2021-09-25: qty 1

## 2021-09-25 MED ORDER — DEXAMETHASONE SODIUM PHOSPHATE 10 MG/ML IJ SOLN
INTRAMUSCULAR | Status: AC
  Filled 2021-09-25: qty 1

## 2021-09-25 MED ORDER — ONDANSETRON HCL 4 MG/2ML IJ SOLN: 4.0000 mg | INTRAMUSCULAR | Status: DC | PRN

## 2021-09-25 MED ORDER — HYDROCHLOROTHIAZIDE 25 MG PO TABS
25.0000 mg | ORAL_TABLET | Freq: Every day | ORAL | Status: DC
  Administered 2021-09-26: 25 mg via ORAL
  Filled 2021-09-25: qty 1

## 2021-09-25 MED ORDER — DEXAMETHASONE SODIUM PHOSPHATE 10 MG/ML IJ SOLN
INTRAMUSCULAR | Status: DC | PRN
  Administered 2021-09-25: 10 mg via INTRAVENOUS

## 2021-09-25 MED ORDER — OXYCODONE HCL 5 MG PO TABS
5.0000 mg | ORAL_TABLET | Freq: Once | ORAL | Status: DC | PRN
Start: 2021-09-25 — End: 2021-09-25

## 2021-09-25 MED ORDER — LIDOCAINE HCL (PF) 2 % IJ SOLN
INTRAMUSCULAR | Status: AC
  Filled 2021-09-25: qty 5

## 2021-09-25 MED ORDER — HYDROMORPHONE HCL 1 MG/ML IJ SOLN
INTRAMUSCULAR | Status: DC | PRN
  Administered 2021-09-25 (×2): .5 mg via INTRAVENOUS

## 2021-09-25 MED ORDER — LACTATED RINGERS IV SOLN: INTRAVENOUS | Status: DC | PRN

## 2021-09-25 MED ORDER — SULFAMETHOXAZOLE-TRIMETHOPRIM 800-160 MG PO TABS: 1.0000 | ORAL_TABLET | Freq: Two times a day (BID) | ORAL | 0 refills | Status: DC

## 2021-09-25 MED ORDER — GLYCOPYRROLATE 0.2 MG/ML IJ SOLN
INTRAMUSCULAR | Status: AC
  Filled 2021-09-25: qty 1

## 2021-09-25 MED ORDER — KCL IN DEXTROSE-NACL 20-5-0.45 MEQ/L-%-% IV SOLN
INTRAVENOUS | Status: DC
  Filled 2021-09-25 (×2): qty 1000

## 2021-09-25 MED ORDER — BACITRACIN-NEOMYCIN-POLYMYXIN 400-5-5000 EX OINT: 1.0000 "application " | TOPICAL_OINTMENT | Freq: Three times a day (TID) | CUTANEOUS | Status: DC | PRN

## 2021-09-25 MED ORDER — LIDOCAINE 2% (20 MG/ML) 5 ML SYRINGE
INTRAMUSCULAR | Status: DC | PRN
  Administered 2021-09-25: 100 mg via INTRAVENOUS

## 2021-09-25 MED ORDER — LACTATED RINGERS IV SOLN
INTRAVENOUS | Status: DC
Start: 2021-09-25 — End: 2021-09-25

## 2021-09-25 MED ORDER — CEFAZOLIN SODIUM-DEXTROSE 2-4 GM/100ML-% IV SOLN
2.0000 g | INTRAVENOUS | Status: AC
  Administered 2021-09-25: 2 g via INTRAVENOUS
  Filled 2021-09-25: qty 100

## 2021-09-25 MED ORDER — HYDROMORPHONE HCL 1 MG/ML IJ SOLN
0.2500 mg | INTRAMUSCULAR | Status: DC | PRN
  Administered 2021-09-25: 0.25 mg via INTRAVENOUS

## 2021-09-25 MED ORDER — DIPHENHYDRAMINE HCL 12.5 MG/5ML PO ELIX: 12.5000 mg | ORAL_SOLUTION | Freq: Four times a day (QID) | ORAL | Status: DC | PRN

## 2021-09-25 MED ORDER — SUGAMMADEX SODIUM 500 MG/5ML IV SOLN
INTRAVENOUS | Status: AC
  Filled 2021-09-25: qty 5

## 2021-09-25 MED ORDER — AMLODIPINE BESYLATE 10 MG PO TABS
10.0000 mg | ORAL_TABLET | Freq: Every day | ORAL | Status: DC
  Administered 2021-09-26: 10 mg via ORAL
  Filled 2021-09-25: qty 1

## 2021-09-25 MED ORDER — OXYCODONE HCL 5 MG/5ML PO SOLN: 5.0000 mg | Freq: Once | ORAL | Status: DC | PRN

## 2021-09-25 MED ORDER — CEFAZOLIN SODIUM-DEXTROSE 1-4 GM/50ML-% IV SOLN
1.0000 g | Freq: Three times a day (TID) | INTRAVENOUS | Status: AC
  Administered 2021-09-25 – 2021-09-26 (×2): 1 g via INTRAVENOUS
  Filled 2021-09-25 (×2): qty 50

## 2021-09-25 MED ORDER — KETOROLAC TROMETHAMINE 15 MG/ML IJ SOLN
15.0000 mg | Freq: Four times a day (QID) | INTRAMUSCULAR | Status: DC
  Administered 2021-09-26 (×3): 15 mg via INTRAVENOUS
  Filled 2021-09-25 (×3): qty 1

## 2021-09-25 MED ORDER — HYDROMORPHONE HCL 1 MG/ML IJ SOLN
INTRAMUSCULAR | Status: AC
  Administered 2021-09-25: 0.25 mg via INTRAVENOUS
  Filled 2021-09-25: qty 1

## 2021-09-25 MED ORDER — POLYETHYLENE GLYCOL 3350 17 G PO PACK: 17.0000 g | PACK | Freq: Every day | ORAL | Status: DC

## 2021-09-25 MED ORDER — ROCURONIUM BROMIDE 10 MG/ML (PF) SYRINGE
PREFILLED_SYRINGE | INTRAVENOUS | Status: DC | PRN
  Administered 2021-09-25: 80 mg via INTRAVENOUS
  Administered 2021-09-25: 20 mg via INTRAVENOUS

## 2021-09-25 MED ORDER — BELLADONNA ALKALOIDS-OPIUM 16.2-60 MG RE SUPP
RECTAL | Status: AC
  Administered 2021-09-25: 1 via RECTAL
  Filled 2021-09-25: qty 1

## 2021-09-25 MED ORDER — MORPHINE SULFATE (PF) 2 MG/ML IV SOLN: 2.0000 mg | INTRAVENOUS | Status: DC | PRN

## 2021-09-25 MED ORDER — BUPIVACAINE-EPINEPHRINE 0.25% -1:200000 IJ SOLN
INTRAMUSCULAR | Status: DC | PRN
  Administered 2021-09-25: 30 mL

## 2021-09-25 MED ORDER — ROCURONIUM BROMIDE 10 MG/ML (PF) SYRINGE
PREFILLED_SYRINGE | INTRAVENOUS | Status: AC
  Filled 2021-09-25: qty 10

## 2021-09-25 MED ORDER — ACETAMINOPHEN 325 MG PO TABS
650.0000 mg | ORAL_TABLET | ORAL | Status: DC | PRN
  Administered 2021-09-25: 650 mg via ORAL
  Filled 2021-09-25 (×3): qty 2

## 2021-09-25 MED ORDER — CHLORHEXIDINE GLUCONATE CLOTH 2 % EX PADS
6.0000 | MEDICATED_PAD | Freq: Every day | CUTANEOUS | Status: DC
  Administered 2021-09-26: 6 via TOPICAL

## 2021-09-25 MED ORDER — SODIUM CHLORIDE 0.9 % IR SOLN
Status: DC | PRN
  Administered 2021-09-25: 1000 mL

## 2021-09-25 MED ORDER — BUPIVACAINE-EPINEPHRINE (PF) 0.25% -1:200000 IJ SOLN
INTRAMUSCULAR | Status: AC
  Filled 2021-09-25: qty 30

## 2021-09-25 MED ORDER — HYDRALAZINE HCL 20 MG/ML IJ SOLN
INTRAMUSCULAR | Status: DC | PRN
  Administered 2021-09-25 (×2): 5 mg via INTRAVENOUS

## 2021-09-25 MED ORDER — GLYCOPYRROLATE PF 0.2 MG/ML IJ SOSY
PREFILLED_SYRINGE | INTRAMUSCULAR | Status: DC | PRN
  Administered 2021-09-25: .1 mg via INTRAVENOUS

## 2021-09-25 MED ORDER — BELLADONNA ALKALOIDS-OPIUM 16.2-60 MG RE SUPP: 1.0000 | Freq: Four times a day (QID) | RECTAL | Status: DC | PRN

## 2021-09-25 MED ORDER — SODIUM CHLORIDE 0.9 % IV BOLUS
1000.0000 mL | Freq: Once | INTRAVENOUS | Status: AC
  Administered 2021-09-25: 1000 mL via INTRAVENOUS

## 2021-09-25 MED ORDER — PROPOFOL 10 MG/ML IV BOLUS
INTRAVENOUS | Status: DC | PRN
  Administered 2021-09-25: 20 mg via INTRAVENOUS
  Administered 2021-09-25: 200 mg via INTRAVENOUS
  Administered 2021-09-25: 20 mg via INTRAVENOUS

## 2021-09-25 MED ORDER — ORAL CARE MOUTH RINSE: 15.0000 mL | Freq: Once | OROMUCOSAL | Status: AC

## 2021-09-25 SURGICAL SUPPLY — 63 items
ADH SKN CLS APL DERMABOND .7 (GAUZE/BANDAGES/DRESSINGS) ×2
APL PRP STRL LF DISP 70% ISPRP (MISCELLANEOUS) ×2
APL SWBSTK 6 STRL LF DISP (MISCELLANEOUS) ×4
APPLICATOR COTTON TIP 6 STRL (MISCELLANEOUS) ×2 IMPLANT
APPLICATOR COTTON TIP 6IN STRL (MISCELLANEOUS) ×6
BAG COUNTER SPONGE SURGICOUNT (BAG) IMPLANT
BAG SPNG CNTER NS LX DISP (BAG)
CATH FOLEY 2WAY SLVR 18FR 30CC (CATHETERS) ×3 IMPLANT
CATH ROBINSON RED A/P 16FR (CATHETERS) ×3 IMPLANT
CATH ROBINSON RED A/P 8FR (CATHETERS) ×3 IMPLANT
CATH TIEMANN FOLEY 18FR 5CC (CATHETERS) ×3 IMPLANT
CHLORAPREP W/TINT 26 (MISCELLANEOUS) ×3 IMPLANT
CLIP LIGATING HEM O LOK PURPLE (MISCELLANEOUS) ×6 IMPLANT
COVER SURGICAL LIGHT HANDLE (MISCELLANEOUS) ×3 IMPLANT
COVER TIP SHEARS 8 DVNC (MISCELLANEOUS) ×2 IMPLANT
COVER TIP SHEARS 8MM DA VINCI (MISCELLANEOUS) ×3
CUTTER ECHEON FLEX ENDO 45 340 (ENDOMECHANICALS) ×3 IMPLANT
DECANTER SPIKE VIAL GLASS SM (MISCELLANEOUS) ×3 IMPLANT
DERMABOND ADVANCED (GAUZE/BANDAGES/DRESSINGS) ×1
DERMABOND ADVANCED .7 DNX12 (GAUZE/BANDAGES/DRESSINGS) ×2 IMPLANT
DRAIN CHANNEL RND F F (WOUND CARE) IMPLANT
DRAPE ARM DVNC X/XI (DISPOSABLE) ×8 IMPLANT
DRAPE COLUMN DVNC XI (DISPOSABLE) ×2 IMPLANT
DRAPE DA VINCI XI ARM (DISPOSABLE) ×12
DRAPE DA VINCI XI COLUMN (DISPOSABLE) ×3
DRAPE SURG IRRIG POUCH 19X23 (DRAPES) ×3 IMPLANT
DRSG TEGADERM 4X4.75 (GAUZE/BANDAGES/DRESSINGS) ×3 IMPLANT
ELECT PENCIL ROCKER SW 15FT (MISCELLANEOUS) ×3 IMPLANT
ELECT REM PT RETURN 15FT ADLT (MISCELLANEOUS) ×3 IMPLANT
GAUZE 4X4 16PLY ~~LOC~~+RFID DBL (SPONGE) ×1 IMPLANT
GAUZE SPONGE 4X4 12PLY STRL (GAUZE/BANDAGES/DRESSINGS) ×1 IMPLANT
GLOVE SURG ENC MOIS LTX SZ6.5 (GLOVE) ×3 IMPLANT
GLOVE SURG ENC TEXT LTX SZ7.5 (GLOVE) ×6 IMPLANT
GOWN STRL REUS W/TWL LRG LVL3 (GOWN DISPOSABLE) ×10 IMPLANT
HOLDER FOLEY CATH W/STRAP (MISCELLANEOUS) ×3 IMPLANT
IRRIG SUCT STRYKERFLOW 2 WTIP (MISCELLANEOUS) ×3
IRRIGATION SUCT STRKRFLW 2 WTP (MISCELLANEOUS) ×2 IMPLANT
KIT TURNOVER KIT A (KITS) ×3 IMPLANT
NDL SAFETY ECLIPSE 18X1.5 (NEEDLE) ×2 IMPLANT
NEEDLE HYPO 18GX1.5 SHARP (NEEDLE) ×3
PACK ROBOT UROLOGY CUSTOM (CUSTOM PROCEDURE TRAY) ×3 IMPLANT
PENCIL SMOKE EVACUATOR (MISCELLANEOUS) IMPLANT
RELOAD STAPLE 45 4.1 GRN THCK (STAPLE) ×2 IMPLANT
SEAL CANN UNIV 5-8 DVNC XI (MISCELLANEOUS) ×8 IMPLANT
SEAL XI 5MM-8MM UNIVERSAL (MISCELLANEOUS) ×12
SET TUBE SMOKE EVAC HIGH FLOW (TUBING) ×3 IMPLANT
SOLUTION ELECTROLUBE (MISCELLANEOUS) ×3 IMPLANT
STAPLE RELOAD 45 GRN (STAPLE) ×2 IMPLANT
STAPLE RELOAD 45MM GREEN (STAPLE) ×3
SUT ETHILON 3 0 PS 1 (SUTURE) ×3 IMPLANT
SUT MNCRL 3 0 RB1 (SUTURE) ×2 IMPLANT
SUT MNCRL 3 0 VIOLET RB1 (SUTURE) ×2 IMPLANT
SUT MNCRL AB 4-0 PS2 18 (SUTURE) ×6 IMPLANT
SUT MONOCRYL 3 0 RB1 (SUTURE) ×6
SUT VIC AB 0 CT1 27 (SUTURE) ×3
SUT VIC AB 0 CT1 27XBRD ANTBC (SUTURE) ×2 IMPLANT
SUT VIC AB 0 UR5 27 (SUTURE) ×3 IMPLANT
SUT VIC AB 2-0 SH 27 (SUTURE) ×3
SUT VIC AB 2-0 SH 27X BRD (SUTURE) ×2 IMPLANT
SUT VICRYL 0 UR6 27IN ABS (SUTURE) ×6 IMPLANT
SYR 27GX1/2 1ML LL SAFETY (SYRINGE) ×3 IMPLANT
TROCAR XCEL NON-BLD 5MMX100MML (ENDOMECHANICALS) IMPLANT
WATER STERILE IRR 1000ML POUR (IV SOLUTION) ×3 IMPLANT

## 2021-09-25 NOTE — Transfer of Care (Signed)
Immediate Anesthesia Transfer of Care Note  Patient: Morgen Ritacco.  Procedure(s) Performed: XI ROBOTIC ASSISTED LAPAROSCOPIC RADICAL PROSTATECTOMY LEVEL 3 LYMPHADENECTOMY, PELVIC (Bilateral)  Patient Location: PACU  Anesthesia Type:General  Level of Consciousness: awake, alert , oriented and patient cooperative  Airway & Oxygen Therapy: Patient Spontanous Breathing and Patient connected to face mask oxygen  Post-op Assessment: Report given to RN, Post -op Vital signs reviewed and stable and Patient moving all extremities X 4  Post vital signs: stable  Last Vitals:  Vitals Value Taken Time  BP 140/89 09/25/21 1019  Temp    Pulse 71 09/25/21 1022  Resp 17 09/25/21 1022  SpO2 100 % 09/25/21 1022  Vitals shown include unvalidated device data.  Last Pain:  Vitals:   09/25/21 0549  TempSrc: Oral         Complications: No notable events documented.

## 2021-09-25 NOTE — Anesthesia Postprocedure Evaluation (Signed)
Anesthesia Post Note  Patient: Jared Rhodes.  Procedure(s) Performed: XI ROBOTIC ASSISTED LAPAROSCOPIC RADICAL PROSTATECTOMY LEVEL 3 LYMPHADENECTOMY, PELVIC (Bilateral)     Patient location during evaluation: PACU Anesthesia Type: General Level of consciousness: awake and alert Pain management: pain level controlled Vital Signs Assessment: post-procedure vital signs reviewed and stable Respiratory status: spontaneous breathing, nonlabored ventilation, respiratory function stable and patient connected to nasal cannula oxygen Cardiovascular status: blood pressure returned to baseline and stable Postop Assessment: no apparent nausea or vomiting Anesthetic complications: no   No notable events documented.  Last Vitals:  Vitals:   09/25/21 1215 09/25/21 1228  BP: 130/79   Pulse: 61 60  Resp: 15 11  Temp:    SpO2: 97% 97%    Last Pain:  Vitals:   09/25/21 1228  TempSrc:   PainSc: 0-No pain                 Gino Garrabrant,Shahid S

## 2021-09-25 NOTE — Discharge Instructions (Signed)

## 2021-09-25 NOTE — Anesthesia Preprocedure Evaluation (Signed)
Anesthesia Evaluation  Patient identified by MRN, date of birth, ID band Patient awake    Reviewed: Allergy & Precautions, NPO status , Patient's Chart, lab work & pertinent test results  Airway Mallampati: II  TM Distance: >3 FB Neck ROM: Full    Dental  (+) Missing, Loose, Poor Dentition, Chipped, Dental Advisory Given,    Pulmonary neg pulmonary ROS, Current Smoker and Patient abstained from smoking.,    Pulmonary exam normal breath sounds clear to auscultation       Cardiovascular hypertension, Normal cardiovascular exam Rhythm:Regular Rate:Normal     Neuro/Psych negative neurological ROS  negative psych ROS   GI/Hepatic negative GI ROS, Neg liver ROS,   Endo/Other  negative endocrine ROS  Renal/GU negative Renal ROS  negative genitourinary   Musculoskeletal  (+) Arthritis ,   Abdominal   Peds negative pediatric ROS (+)  Hematology  (+) anemia ,   Anesthesia Other Findings   Reproductive/Obstetrics negative OB ROS                             Anesthesia Physical Anesthesia Plan  ASA: 3  Anesthesia Plan: General   Post-op Pain Management:    Induction: Intravenous  PONV Risk Score and Plan: 2 and Ondansetron, Dexamethasone and Treatment may vary due to age or medical condition  Airway Management Planned: Oral ETT  Additional Equipment:   Intra-op Plan:   Post-operative Plan: Extubation in OR  Informed Consent: I have reviewed the patients History and Physical, chart, labs and discussed the procedure including the risks, benefits and alternatives for the proposed anesthesia with the patient or authorized representative who has indicated his/her understanding and acceptance.     Dental advisory given  Plan Discussed with: CRNA and Surgeon  Anesthesia Plan Comments:         Anesthesia Quick Evaluation

## 2021-09-25 NOTE — Interval H&P Note (Signed)
History and Physical Interval Note:  09/25/2021 7:03 AM  Jared Rhodes.  has presented today for surgery, with the diagnosis of PROSTATE CANCER.  The various methods of treatment have been discussed with the patient and family. After consideration of risks, benefits and other options for treatment, the patient has consented to  Procedure(s): XI ROBOTIC ASSISTED LAPAROSCOPIC RADICAL PROSTATECTOMY LEVEL 3 (N/A) LYMPHADENECTOMY, PELVIC (Bilateral) as a surgical intervention.  The patient's history has been reviewed, patient examined, no change in status, stable for surgery.  I have reviewed the patient's chart and labs.  Questions were answered to the patient's satisfaction.     [employer]

## 2021-09-25 NOTE — Progress Notes (Signed)
PCP on call for Urology was notified to get orders for Pain Medications. Awaiting New orders.

## 2021-09-25 NOTE — Anesthesia Procedure Notes (Signed)
Procedure Name: Intubation Date/Time: 09/25/2021 7:33 AM Performed by: Lissa Morales, CRNA Pre-anesthesia Checklist: Patient identified, Emergency Drugs available, Suction available and Patient being monitored Patient Re-evaluated:Patient Re-evaluated prior to induction Oxygen Delivery Method: Circle system utilized Preoxygenation: Pre-oxygenation with 100% oxygen Induction Type: IV induction Ventilation: Mask ventilation without difficulty Laryngoscope Size: 4 and Glidescope Grade View: Grade III Tube type: Oral Tube size: 8.0 mm Number of attempts: 2 Airway Equipment and Method: Stylet and Oral airway Placement Confirmation: ETT inserted through vocal cords under direct vision, positive ETCO2 and breath sounds checked- equal and bilateral Secured at: 22 cm Tube secured with: Tape Dental Injury: Teeth and Oropharynx as per pre-operative assessment  Difficulty Due To: Difficulty was anticipated, Difficult Airway- due to anterior larynx, Difficult Airway- due to dentition and Difficult Airway- due to large tongue Comments:  CRNA attempted with esophageal  intubation, immediately recognized and glidescope used by Dr. Kalman Shan . O2 sats 95-100%.     Poor dentition of  teeth intact

## 2021-09-25 NOTE — Op Note (Signed)
Preoperative diagnosis: Clinically localized adenocarcinoma of the prostate (clinical stage T1c N0 M0)  Postoperative diagnosis: Clinically localized adenocarcinoma of the prostate (clinical stage T1c N0 M0)  Procedure:  Salvage robotic assisted laparoscopic radical prostatectomy (non nerve sparing) Bilateral robotic assisted laparoscopic pelvic lymphadenectomy  Surgeon: Pryor Curia. M.D.  Assistant(s): Debbrah Alar, PA-C  Anesthesia: General  Complications: None  EBL: 50 mL  IVF:  1700 mL crystalloid  Specimens: Prostate and seminal vesicles Right pelvic lymph nodes Left pelvic lymph nodes  Disposition of specimens: Pathology  Drains: 20 Fr coude catheter # 19 Blake pelvic drain  Indication: Jared Rhodes. is a 70 y.o. year old patient with clinically localized prostate cancer.  After a thorough review of the management options for treatment of prostate cancer, he elected to proceed with surgical therapy and the above procedure(s).  We have discussed the potential benefits and risks of the procedure, side effects of the proposed treatment, the likelihood of the patient achieving the goals of the procedure, and any potential problems that might occur during the procedure or recuperation. Informed consent has been obtained.  Description of procedure:  The patient was taken to the operating room and a general anesthetic was administered. He was given preoperative antibiotics, placed in the dorsal lithotomy position, and prepped and draped in the usual sterile fashion. Next a preoperative timeout was performed. A urethral catheter was placed into the bladder and a site was selected near the umbilicus for placement of the camera port. This was placed using a standard open Hassan technique which allowed entry into the peritoneal cavity under direct vision and without difficulty. A 12 mm port was placed and a pneumoperitoneum established. The camera was then used to  inspect the abdomen and there was no evidence of any intra-abdominal injuries or other abnormalities. The remaining abdominal ports were then placed. 8 mm robotic ports were placed in the right lower quadrant, left lower quadrant, and far left lateral abdominal wall. A 5 mm port was placed in the right upper quadrant and a 12 mm port was placed in the right lateral abdominal wall for laparoscopic assistance. All ports were placed under direct vision without difficulty. The surgical cart was then docked.   Utilizing the cautery scissors, the bladder was reflected posteriorly allowing entry into the space of Retzius and identification of the endopelvic fascia and prostate.  The periprostatic tissue was very blanched with tissue planes noted to be quite difficult consistent with his history of prior radiation therapy to the pelvis. The periprostatic fat was then removed from the prostate allowing full exposure of the endopelvic fascia. The endopelvic fascia was then incised from the apex back to the base of the prostate bilaterally and the underlying levator muscle fibers were swept laterally off the prostate thereby isolating the dorsal venous complex. The dorsal vein was then stapled and divided with a 45 mm Flex Echelon stapler. Attention then turned to the bladder neck which was divided anteriorly thereby allowing entry into the bladder and exposure of the urethral catheter. The catheter balloon was deflated and the catheter was brought into the operative field and used to retract the prostate anteriorly. The posterior bladder neck was then examined and was divided allowing further dissection between the bladder and prostate posteriorly until the vasa deferentia and seminal vessels were identified. The vasa deferentia were isolated, divided, and lifted anteriorly. The seminal vesicles were dissected down to their tips with care to control the seminal vascular arterial blood supply. These  structures were then  lifted anteriorly and the space between Denonvillier's fascia and the anterior rectum was developed with a combination of sharp and blunt dissection. This isolated the vascular pedicles of the prostate.  A wide non nerve sparing dissection was performed with Weck clips used to ligate the vascular pedicles of the prostate bilaterally. The vascular pedicles of the prostate were then divided.  The urethra was then sharply transected allowing the prostate specimen to be disarticulated. The pelvis was copiously irrigated and hemostasis was ensured. There was no evidence for rectal injury.  Attention then turned to the right pelvic sidewall. The fibrofatty tissue between the external iliac vein, confluence of the iliac vessels, hypogastric artery, and Cooper's ligament was dissected free from the pelvic sidewall with care to preserve the obturator nerve. Weck clips were used for lymphostasis and hemostasis. An identical procedure was performed on the contralateral side and the lymphatic packets were removed for permanent pathologic analysis.  Attention then turned to the urethral anastomosis. A 2-0 Vicryl slip knot was placed between Denonvillier's fascia, the posterior bladder neck, and the posterior urethra to reapproximate these structures. A double-armed 3-0 Monocryl suture was then used to perform a 360 running tension-free anastomosis between the bladder neck and urethra. A new urethral catheter was then placed into the bladder and irrigated. There were no blood clots within the bladder and the anastomosis appeared to be watertight. A #19 Blake drain was then brought through the left lateral 8 mm port site and positioned appropriately within the pelvis. It was secured to the skin with a nylon suture. The surgical cart was then undocked. The right lateral 12 mm port site was closed at the fascial level with a 0 Vicryl suture placed laparoscopically. All remaining ports were then removed under direct vision.  The prostate specimen was removed intact within the Endopouch retrieval bag via the periumbilical camera port site. This fascial opening was closed with two running 0 Vicryl sutures. 0.25% Marcaine was then injected into all port sites and all incisions were reapproximated at the skin level with 3-0 Monocryl subcuticular sutures. Dermabond was applied to the skin. The patient appeared to tolerate the procedure well and without complications. The patient was able to be extubated and transferred to the recovery unit in satisfactory condition.  Pryor Curia MD

## 2021-09-26 ENCOUNTER — Encounter (HOSPITAL_COMMUNITY): Payer: Self-pay | Admitting: Urology

## 2021-09-26 DIAGNOSIS — I10 Essential (primary) hypertension: Secondary | ICD-10-CM | POA: Diagnosis not present

## 2021-09-26 DIAGNOSIS — C61 Malignant neoplasm of prostate: Secondary | ICD-10-CM | POA: Diagnosis not present

## 2021-09-26 DIAGNOSIS — Z79899 Other long term (current) drug therapy: Secondary | ICD-10-CM | POA: Diagnosis not present

## 2021-09-26 LAB — HEMOGLOBIN AND HEMATOCRIT, BLOOD
HCT: 32.6 % — ABNORMAL LOW (ref 39.0–52.0)
Hemoglobin: 11.1 g/dL — ABNORMAL LOW (ref 13.0–17.0)

## 2021-09-26 LAB — SARS CORONAVIRUS 2 (TAT 6-24 HRS): SARS Coronavirus 2: NEGATIVE

## 2021-09-26 MED ORDER — TRAMADOL HCL 50 MG PO TABS: 50.0000 mg | ORAL_TABLET | Freq: Four times a day (QID) | ORAL | Status: DC | PRN

## 2021-09-26 MED ORDER — BISACODYL 10 MG RE SUPP
10.0000 mg | Freq: Once | RECTAL | Status: AC
  Administered 2021-09-26: 10 mg via RECTAL
  Filled 2021-09-26: qty 1

## 2021-09-26 NOTE — Discharge Summary (Signed)
  Date of admission: 09/25/2021  Date of discharge: 09/26/2021  Admission diagnosis: Prostate Cancer  Discharge diagnosis: Prostate Cancer  History and Physical: For full details, please see admission history and physical. Briefly, Jared Rhodes. is a 70 y.o. gentleman with localized prostate cancer.  After discussing management/treatment options, he elected to proceed with surgical treatment.  Hospital Course: Jared Rhodes. was taken to the operating room on 09/25/2021 and underwent a robotic assisted laparoscopic radical prostatectomy. He tolerated this procedure well and without complications. Postoperatively, he was able to be transferred to a regular hospital room following recovery from anesthesia.  He was able to begin ambulating the night of surgery. He remained hemodynamically stable overnight.  He had excellent urine output with appropriately minimal output from his pelvic drain and his pelvic drain was removed on POD #1.  He was transitioned to oral pain medication, tolerated a clear liquid diet, and had met all discharge criteria and was able to be discharged home later on POD#1.  Laboratory values:  Recent Labs    09/25/21 1033 09/26/21 0506  HGB 12.4* 11.1*  HCT 37.6* 32.6*    Disposition: Home  Discharge instruction: He was instructed to be ambulatory but to refrain from heavy lifting, strenuous activity, or driving. He was instructed on urethral catheter care.  Discharge medications:   Allergies as of 09/26/2021       Reactions   Penicillins Rash   Has patient had a PCN reaction causing immediate rash, facial/tongue/throat swelling, SOB or lightheadedness with hypotension: UnknowN Has patient had a PCN reaction causing severe rash involving mucus membranes or skin necrosis: UnknowN Has patient had a PCN reaction that required hospitalization: Unknown Has patient had a PCN reaction occurring within the last 10 years: /Unknown If all of the above answers are  "NO", then may proceed with Cephalosporin use.        Medication List     STOP taking these medications    CENTRUM SILVER PO   meloxicam 7.5 MG tablet Commonly known as: Mobic       TAKE these medications    acetaminophen 325 MG tablet Commonly known as: TYLENOL Take 650 mg by mouth every 6 (six) hours as needed for moderate pain.   amLODipine 10 MG tablet Commonly known as: NORVASC TAKE 1 TABLET(10 MG) BY MOUTH DAILY   docusate sodium 100 MG capsule Commonly known as: COLACE Take 1 capsule (100 mg total) by mouth 2 (two) times daily.   hydrochlorothiazide 25 MG tablet Commonly known as: HYDRODIURIL Take 1 tablet (25 mg total) by mouth daily. May take and additional tablet for increase edema in feet.   sulfamethoxazole-trimethoprim 800-160 MG tablet Commonly known as: BACTRIM DS Take 1 tablet by mouth 2 (two) times daily. Start the day prior to foley removal appointment   traMADol 50 MG tablet Commonly known as: Ultram Take 1-2 tablets (50-100 mg total) by mouth every 6 (six) hours as needed for moderate pain or severe pain.        Followup: He will followup in 1 week for catheter removal and to discuss his surgical pathology results.

## 2021-09-26 NOTE — Progress Notes (Signed)
Suppository given as ordered, JP discontinued, tol well. SRP, RN

## 2021-09-26 NOTE — Progress Notes (Signed)
Discharge teaching complete, instructed and demonstrated to patient how to empty foley drainage bag.Pt and significant other able to demonstrate how to empty the foley drainage bag. Supplies given to pt for home care and pt acknowledged understanding. He understand how to take his prescribed meds and follow-up appointments. SRP, RN

## 2021-09-26 NOTE — Progress Notes (Signed)
Patient ID: Jared Rhodes., male   DOB: 04/26/1951, 70 y.o.   MRN: [id number]  1 Day Post-Op Subjective: The patient is doing well.  No nausea or vomiting. Pain is adequately controlled.  Objective: Vital signs in last 24 hours: Temp:  [97.5 F (36.4 C)-99.5 F (37.5 C)] 98.6 F (37 C) (10/04 0532) Pulse Rate:  [56-86] 57 (10/04 0532) Resp:  [8-22] 14 (10/04 0532) BP: (102-151)/(73-97) 127/79 (10/04 0532) SpO2:  [94 %-100 %] 96 % (10/04 0532) Weight:  [93.3 kg] 93.3 kg (10/03 2104)  Intake/Output from previous day: 10/03 0701 - 10/04 0700 In: 6285.1 [P.O.:4200; I.V.:2035.1; IV Piggyback:50] Out: 4090 [Urine:3800; Drains:240; Blood:50] Intake/Output this shift: No intake/output data recorded.  Physical Exam:  General: Alert and oriented. CV: RRR Lungs: Clear bilaterally. GI: Soft, Nondistended. Incisions: Clean, dry, and intact Urine: Clear Extremities: Nontender, no erythema, no edema.  Lab Results: Recent Labs    09/25/21 1033 09/26/21 0506  HGB 12.4* 11.1*  HCT 37.6* 32.6*      Assessment/Plan: POD# 1 s/p robotic prostatectomy.  1) SL IVF 2) Ambulate, Incentive spirometry 3) Transition to oral pain medication 4) Dulcolax suppository 5) D/C pelvic drain 6) Plan for likely discharge later today   Pryor Curia. MD   LOS: 0 days   Dutch Gray 09/26/2021, 8:03 AM

## 2021-09-26 NOTE — Plan of Care (Signed)
  Problem: Clinical Measurements: Goal: Will remain free from infection Outcome: Adequate for Discharge   

## 2021-09-27 ENCOUNTER — Telehealth: Payer: Self-pay

## 2021-09-27 NOTE — Telephone Encounter (Signed)
Transition Care Management Unsuccessful Follow-up Telephone Call  Date of discharge and from where:  09/26/2021, [facility]   Attempts:  1st Attempt  Reason for unsuccessful TCM follow-up call:  Left voice message on # [phone]

## 2021-09-28 ENCOUNTER — Telehealth: Payer: Self-pay

## 2021-09-28 NOTE — Telephone Encounter (Signed)
Transition Care Management Unsuccessful Follow-up Telephone Call  Date of discharge and from where:  09/26/2021, [facility]   Attempts:  2nd Attempt  Reason for unsuccessful TCM follow-up call:  Left voice message on # [phone], call back requested to this CM.  Call initially placed to the same number and the patient answered and confirmed his last name then hung up when asked his birthday.

## 2021-09-29 ENCOUNTER — Telehealth: Payer: Self-pay

## 2021-09-29 NOTE — Telephone Encounter (Signed)
Transition Care Management Unsuccessful Follow-up Telephone Call   Date of discharge and from where:  09/26/2021, [facility]    Attempts:  3rd  Attempt   Reason for unsuccessful TCM follow-up call:  Called pt unable to reach or leave VM , VM is full. [phone]

## 2021-10-01 LAB — SURGICAL PATHOLOGY

## 2021-10-06 ENCOUNTER — Telehealth (INDEPENDENT_AMBULATORY_CARE_PROVIDER_SITE_OTHER): Payer: Self-pay

## 2021-10-06 NOTE — Telephone Encounter (Signed)
Copied from [place] [id number]. Topic: General - Inquiry >> Oct 05, 2021  2:32 PM Greggory Keen D wrote: Reason for CRM:  Pt called needing clarification on how to take his blood pressure medication  CB#  [phone]  Please advise patient on intake of medication. Patient has several instructions.

## 2021-10-10 NOTE — Telephone Encounter (Signed)
Patient is aware of how to take BP medications. States he does not have any swelling in feet and has never spoken to PCP about swelling in feet. Advised him to only take 1 HCTZ as he denies swelling. He verbalized understanding.

## 2021-10-23 DIAGNOSIS — M62838 Other muscle spasm: Secondary | ICD-10-CM | POA: Diagnosis not present

## 2021-10-23 DIAGNOSIS — M6281 Muscle weakness (generalized): Secondary | ICD-10-CM | POA: Diagnosis not present

## 2021-11-09 ENCOUNTER — Encounter (INDEPENDENT_AMBULATORY_CARE_PROVIDER_SITE_OTHER): Payer: Self-pay | Admitting: Primary Care

## 2021-11-09 ENCOUNTER — Ambulatory Visit (INDEPENDENT_AMBULATORY_CARE_PROVIDER_SITE_OTHER): Payer: Medicare Other | Admitting: Primary Care

## 2021-11-09 ENCOUNTER — Other Ambulatory Visit: Payer: Self-pay

## 2021-11-09 VITALS — BP 111/77 | HR 89 | Temp 97.7°F | Ht 68.0 in | Wt 194.2 lb

## 2021-11-09 DIAGNOSIS — Z0000 Encounter for general adult medical examination without abnormal findings: Secondary | ICD-10-CM

## 2021-11-09 NOTE — Progress Notes (Signed)
[facility]        Subjective:   [name] is a 70 y.o. male who presents for an Initial Medicare Annual Wellness Visit.  Review of Systems    Review of Systems  HENT:  Positive for hearing loss.   Genitourinary:  Positive for frequency and urgency.  All other systems reviewed and are negative.        Objective:   BP 111/77 (BP Location: Right Arm, Patient Position: Sitting, Cuff Size: Normal)   Pulse 89   Temp 97.7 F (36.5 C) (Temporal)   Ht 5\' 8"  (1.727 m)   Wt 194 lb 3.2 oz (88.1 kg)   SpO2 98%   BMI 29.53 kg/m   Physical exam: General: Vital signs reviewed.  Patient is well-developed and well-nourished,obese male in mild distress and cooperative with exam. Head: Normocephalic and atraumatic. Eyes: EOMI, conjunctivae normal, no scleral icterus. Neck: Supple, trachea midline, normal ROM, no JVD, masses, thyromegaly, or carotid bruit present. Cardiovascular: RRR, S1 normal, S2 normal, no murmurs, gallops, or rubs. Pulmonary/Chest: Clear to auscultation bilaterally, no wheezes, rales, or rhonchi. Abdominal: Soft, non-tender, non-distended, BS +, no masses, organomegaly, or guarding present. Musculoskeletal: No joint deformities, erythema, or stiffness, ROM full and nontender. Extremities: No lower extremity edema bilaterally,  pulses symmetric and intact bilaterally. No cyanosis or clubbing. Neurological: A&O x3, Strength is normal Skin: Warm, dry and intact. No rashes or erythema. Psychiatric: Normal mood and affect. speech and behavior is normal. Cognition and memory are normal.    Advanced Directives 11/09/2021 09/25/2021 09/20/2021 06/02/2021 11/18/2018 01/30/2018 01/28/2018  Does Patient Have a Medical Advance Directive? No No No No No No No  Type of Advance Directive - - - - - - -  Does patient want to make changes to medical advance directive? - - - - - - -  Copy of [place] in Chart? - - - - - - -  Would patient  like information on creating a medical advance directive? Yes (ED - Information included in AVS) No - Patient declined No - Patient declined No - Patient declined No - Patient declined - No - Patient declined    Current Medications (verified) Outpatient Encounter Medications as of 11/09/2021  Medication Sig  . amLODipine (NORVASC) 10 MG tablet TAKE 1 TABLET(10 MG) BY MOUTH DAILY  . hydrochlorothiazide (HYDRODIURIL) 25 MG tablet Take 1 tablet (25 mg total) by mouth daily. May take and additional tablet for increase edema in feet.  Marland Kitchen acetaminophen (TYLENOL) 325 MG tablet Take 650 mg by mouth every 6 (six) hours as needed for moderate pain.  Marland Kitchen docusate sodium (COLACE) 100 MG capsule Take 1 capsule (100 mg total) by mouth 2 (two) times daily.  . traMADol (ULTRAM) 50 MG tablet Take 1-2 tablets (50-100 mg total) by mouth every 6 (six) hours as needed for moderate pain or severe pain. (Patient not taking: Reported on 11/09/2021)  . [DISCONTINUED] sulfamethoxazole-trimethoprim (BACTRIM DS) 800-160 MG tablet Take 1 tablet by mouth 2 (two) times daily. Start the day prior to foley removal appointment   No facility-administered encounter medications on file as of 11/09/2021.    Allergies (verified) Penicillins   History: Past Medical History:  Diagnosis Date  . Hypertension   . Prostate cancer ([place]) 2018   radation 40 treatments    Past Surgical History:  Procedure Laterality Date  . I & D EXTREMITY Left 07/17/2014   Procedure: IRRIGATION AND DEBRIDEMENT EXTREMITY;  Surgeon: Marianna Payment, MD;  Location: [place];  Service: Orthopedics;  Laterality: Left;  . I & D EXTREMITY Left 07/19/2014   Procedure: IRRIGATION AND DEBRIDEMENT EXTREMITY;  Surgeon: Marianna Payment, MD;  Location: [place];  Service: Orthopedics;  Laterality: Left;  . LYMPHADENECTOMY Bilateral 09/25/2021   Procedure: LYMPHADENECTOMY, PELVIC;  Surgeon: Raynelle Bring, MD;  Location: [facility];  Service: Urology;  Laterality:  Bilateral;  . ORIF ANKLE FRACTURE Left 07/19/2014   Procedure: OPEN REDUCTION INTERNAL FIXATION (ORIF) ANKLE FRACTURE;  Surgeon: Marianna Payment, MD;  Location: [place];  Service: Orthopedics;  Laterality: Left;  . PROSTATE BIOPSY    . ROBOT ASSISTED LAPAROSCOPIC RADICAL PROSTATECTOMY N/A 09/25/2021   Procedure: XI ROBOTIC ASSISTED LAPAROSCOPIC RADICAL PROSTATECTOMY LEVEL 3;  Surgeon: Raynelle Bring, MD;  Location: [facility];  Service: Urology;  Laterality: N/A;  . TONSILLECTOMY    . TOTAL HIP ARTHROPLASTY Right 03/02/2016   Procedure: RIGHT TOTAL HIP ARTHROPLASTY ANTERIOR APPROACH;  Surgeon: Leandrew Koyanagi, MD;  Location: [place];  Service: Orthopedics;  Laterality: Right;   Family History  Problem Relation Age of Onset  . Alcohol abuse Mother   . Heart disease Sister   . Hyperlipidemia Brother   . Colon cancer Neg Hx   . Breast cancer Neg Hx   . Prostate cancer Neg Hx   . Pancreatic cancer Neg Hx    Social History   Socioeconomic History  . Marital status: Divorced    Spouse name: n/a  . Number of children: 0  . Years of education: 11th grade  . Highest education level: Not on file  Occupational History  . 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 Occupation: truck Geophysicist/field seismologist  Tobacco Use  . Smoking status: Some Days    Packs/day: 1.00    Years: 30.00    Pack years: 30.00    Types: Cigarettes  . Smokeless tobacco: Never  Vaping Use  . Vaping Use: Never used  Substance and Sexual Activity  . Alcohol use: Yes    Alcohol/week: 12.0 standard drinks    Types: 12 Cans of beer per week    Comment: 12 beers per week per pt.   . Drug use: No  . Sexual activity: Yes    Partners: Female  Other Topics Concern  . Not on file  Social History Narrative   Lives alone. 2 brothers and 1 sister live in [place].  1 half-sister lives in [place].   Social Determinants of Health   Financial Resource Strain: Low Risk   . Difficulty of Paying Living Expenses: Not hard at all  Food  Insecurity: Food Insecurity Present  . Worried About Charity fundraiser in the Last Year: Sometimes true  . Ran Out of Food in the Last Year: Never true  Transportation Needs: No Transportation Needs  . Lack of Transportation (Medical): No  . Lack of Transportation (Non-Medical): No  Physical Activity: Sufficiently Active  . Days of Exercise per Week: 7 days  . Minutes of Exercise per Session: 60 min  Stress: No Stress Concern Present  . Feeling of Stress : Not at all  Social Connections: Moderately Isolated  . Frequency of Communication with Friends and Family: More than three times a week  . Frequency of Social Gatherings with Friends and Family: More than three times a week  . Attends Religious Services: Never  . Active Member of Clubs or Organizations: No  . Attends Archivist Meetings: Never  . Marital Status: Living  with partner    Tobacco Counseling Ready to quit: Not Answered Counseling given: Not Answered   Clinical Intake:  Pre-visit preparation completed: Yes  Pain : No/denies pain     Diabetes: No  How often do you need to have someone help you when you read instructions, pamphlets, or other written materials from your doctor or pharmacy?: 1 - Never  Diabetic?No   Interpreter Needed?: No      Activities of Daily Living In your present state of health, do you have any difficulty performing the following activities: 09/25/2021 09/25/2021  Hearing? N N  Vision? Y Y  Comment - -  Difficulty concentrating or making decisions? N -  Walking or climbing stairs? N -  Dressing or bathing? N -  Doing errands, shopping? N -  Some recent data might be hidden    Patient Care Team: Kerin Perna, NP as PCP - General (Internal Medicine) Wyatt Portela, MD as Consulting Physician (Oncology) Raynelle Bring, MD as Consulting Physician (Urology) Tyler Pita, MD as Consulting Physician (Radiation Oncology) Cira Rue, RN as Oncology Nurse  Navigator  Indicate any recent Medical Services you may have received from other than [facility] providers in the past year (date may be approximate).     Assessment:   This is a routine wellness examination for Jared Rhodes.  Hearing/Vision screen No results found.  Dietary issues and exercise activities discussed:     Goals Addressed   None   Depression Screen PHQ 2/9 Scores 11/09/2021 02/28/2021 02/07/2021  PHQ - 2 Score 0 0 0    Fall Risk Fall Risk  11/09/2021 02/28/2021 02/07/2021 08/07/2017  Falls in the past year? 0 0 0 No    FALL RISK PREVENTION PERTAINING TO THE HOME:  Any stairs in or around the home? Yes  If so, are there any without handrails? No  Home free of loose throw rugs in walkways, pet beds, electrical cords, etc? Yes  Adequate lighting in your home to reduce risk of falls? Yes   ASSISTIVE DEVICES UTILIZED TO PREVENT FALLS:  Life alert? No  Use of a cane, walker or w/c? No  Grab bars in the bathroom? No  Shower chair or bench in shower? No  Elevated toilet seat or a handicapped toilet? No   TIMED UP AND GO:  Was the test performed? Yes .  Length of time to ambulate 10 feet: 8 sec.   Gait steady and fast without use of assistive device  Cognitive Function:     6CIT Screen 11/09/2021  What Year? 0 points  What month? 0 points  What time? 0 points  Count back from 20 0 points  Months in reverse 0 points  Repeat phrase 2 points  Total Score 2    Immunizations Immunization History  Administered Date(s) Administered  . PPD Test 03/07/2016  . Pneumococcal Conjugate-13 07/07/2019  . Tdap 07/17/2014    TDAP status: Up to date  Flu Vaccine status: Declined, Education has been provided regarding the importance of this vaccine but patient still declined. Advised may receive this vaccine at local pharmacy or Health Dept. Aware to provide a copy of the vaccination record if obtained from local pharmacy or Health Dept. Verbalized acceptance and  understanding.  Pneumococcal vaccine status: Completed during today's visit.  Covid-19 vaccine status: Completed vaccines  Qualifies for Shingles Vaccine? Yes   Zostavax completed No   Shingrix Completed?: No.    Education has been provided regarding the importance of this vaccine. Patient has  been advised to call insurance company to determine out of pocket expense if they have not yet received this vaccine. Advised may also receive vaccine at local pharmacy or Health Dept. Verbalized acceptance and understanding.  Screening Tests Health Maintenance  Topic Date Due  . COVID-19 Vaccine (1) Never done  . Zoster Vaccines- Shingrix (1 of 2) Never done  . Pneumonia Vaccine 41+ Years old (2 - PPSV23 if available, else PCV20) 07/06/2020  . INFLUENZA VACCINE  Never done  . Hepatitis C Screening  02/28/2022 (Originally 07/15/1969)  . COLONOSCOPY (Pts 45-66yrs Insurance coverage will need to be confirmed)  01/28/2023  . TETANUS/TDAP  07/17/2024  . HPV VACCINES  Aged Out    Health Maintenance  Health Maintenance Due  Topic Date Due  . COVID-19 Vaccine (1) Never done  . Zoster Vaccines- Shingrix (1 of 2) Never done  . Pneumonia Vaccine 69+ Years old (2 - PPSV23 if available, else PCV20) 07/06/2020  . INFLUENZA VACCINE  Never done    Colorectal cancer screening: Type of screening: Colonoscopy. Completed 01/28/2018. Repeat every 5 years  Lung Cancer Screening: (Low Dose CT Chest recommended if Age 31-80 years, 30 pack-year currently smoking OR have quit w/in 15years.) does qualify.   Lung Cancer Screening Referral: placed by PCP  Additional Screening:  Hepatitis C Screening: does qualify; Completed NO- patient declines   Vision Screening: Recommended annual ophthalmology exams for early detection of glaucoma and other disorders of the eye. Is the patient up to date with their annual eye exam?  No  Who is the provider or what is the name of the office in which the patient attends annual  eye exams? Dr. Satira Sark. Patient will call to schedule annual eye exam.  If pt is not established with a provider, would they like to be referred to a provider to establish care? No .   Dental Screening: Recommended annual dental exams for proper oral hygiene  Community Resource Referral / Chronic Care Management: CRR required this visit?  No   CCM required this visit?  No      Plan:     I have personally reviewed and noted the following in the patient's chart:   Medical and social history Use of alcohol, tobacco or illicit drugs  Current medications and supplements including opioid prescriptions. Patient is not currently taking opioid prescriptions. Functional ability and status Nutritional status Physical activity Advanced directives List of other physicians Hospitalizations, surgeries, and ER visits in previous 12 months Vitals Screenings to include cognitive, depression, and falls Referrals and appointments  In addition, I have reviewed and discussed with patient certain preventive protocols, quality metrics, and best practice recommendations. A written personalized care plan for preventive services as well as general preventive health recommendations were provided to patient.     Nat Christen, CMA   11/09/2021   Nurse Notes: face to face    Jared Rhodes , Thank you for taking time to come for your Medicare Wellness Visit. I appreciate your ongoing commitment to your health goals. Please review the following plan we discussed and let me know if I can assist you in the future.   These are the goals we discussed:  Goals   None     This is a list of the screening recommended for you and due dates:  Health Maintenance  Topic Date Due  . COVID-19 Vaccine (1) Never done  . Zoster (Shingles) Vaccine (1 of 2) Never done  . Pneumonia Vaccine (2 - PPSV23  if available, else PCV20) 07/06/2020  . Flu Shot  Never done  . Hepatitis C Screening: USPSTF Recommendation  to screen - Ages 18-79 yo.  02/28/2022*  . Colon Cancer Screening  01/28/2023  . Tetanus Vaccine  07/17/2024  . HPV Vaccine  Aged Out  *Topic was postponed. The date shown is not the original due date.

## 2021-11-09 NOTE — Patient Instructions (Signed)
Advance Directive Advance directives are legal documents that allow you to make decisions about your health care and medical treatment in case you become unable to communicate for yourself. Advance directives let your wishes be known to family, friends, and health care providers. Discussing and writing advance directives should happen over time rather than all at once. Advance directives can be changed and updated at any time. There are different types of advance directives, such as: Medical power of attorney. Living will. Do not resuscitate (DNR) order or do not attempt resuscitation (DNAR) order. Health care proxy and medical power of attorney A health care proxy is also called a health care agent. This person is appointed to make medical decisions for you when you are unable to make decisions for yourself. Generally, people ask a trusted friend or family member to act as their proxy and represent their preferences. Make sure you have an agreement with your trusted person to act as your proxy. A proxy may have to make a medical decision on your behalf if your wishes are not known. A medical power of attorney, also called a durable power of attorney for health care, is a legal document that names your health care proxy. Depending on the laws in your state, the document may need to be: Signed. Notarized. Dated. Copied. Witnessed. Incorporated into your medical record. You may also want to appoint a trusted person to manage your money in the event you are unable to do so. This is called a durable power of attorney for finances. It is a separate legal document from the durable power of attorney for health care. You may choose your health care proxy or someone different to act as your agent in money matters. If you do not appoint a proxy, or there is a concern that the proxy is not acting in your best interest, a court may appoint a guardian to act on your behalf. Living will A living will is a set of  instructions that state your wishes about medical care when you cannot express them yourself. Health care providers should keep a copy of your living will in your medical record. You may want to give a copy to family members or friends. To alert caregivers in case of an emergency, you can place a card in your wallet to let them know that you have a living will and where they can find it. A living will is used if you become: Terminally ill. Disabled. Unable to communicate or make decisions. The following decisions should be included in your living will: To use or not to use life support equipment, such as dialysis machines and breathing machines (ventilators). Whether you want a DNR or DNAR order. This tells health care providers not to use cardiopulmonary resuscitation (CPR) if breathing or heartbeat stops. To use or not to use tube feeding. To be given or not to be given food and fluids. Whether you want comfort (palliative) care when the goal becomes comfort rather than a cure. Whether you want to donate your organs and tissues. A living will does not give instructions for distributing your money and property if you should pass away. DNR or DNAR A DNR or DNAR order is a request not to have CPR in the event that your heart stops beating or you stop breathing. If a DNR or DNAR order has not been made and shared, a health care provider will try to help any patient whose heart has stopped or who has stopped breathing. If  you plan to have surgery, talk with your health care provider about how your DNR or DNAR order will be followed if problems occur. What if I do not have an advance directive? Some states assign family decision makers to act on your behalf if you do not have an advance directive. Each state has its own laws about advance directives. You may want to check with your health care provider, attorney, or state representative about the laws in your state. Summary Advance directives are legal  documents that allow you to make decisions about your health care and medical treatment in case you become unable to communicate for yourself. The process of discussing and writing advance directives should happen over time. You can change and update advance directives at any time. Advance directives may include a medical power of attorney, a living will, and a DNR or DNAR order. This information is not intended to replace advice given to you by your health care provider. Make sure you discuss any questions you have with your health care provider. Document Revised: 09/13/2020 Document Reviewed: 09/13/2020 Elsevier Patient Education  2022 [employer].

## 2021-11-10 ENCOUNTER — Encounter (INDEPENDENT_AMBULATORY_CARE_PROVIDER_SITE_OTHER): Payer: Self-pay | Admitting: Primary Care

## 2021-11-14 DIAGNOSIS — M62838 Other muscle spasm: Secondary | ICD-10-CM | POA: Diagnosis not present

## 2021-11-14 DIAGNOSIS — M6281 Muscle weakness (generalized): Secondary | ICD-10-CM | POA: Diagnosis not present

## 2021-11-14 DIAGNOSIS — R8271 Bacteriuria: Secondary | ICD-10-CM | POA: Diagnosis not present

## 2021-11-15 ENCOUNTER — Other Ambulatory Visit (INDEPENDENT_AMBULATORY_CARE_PROVIDER_SITE_OTHER): Payer: Self-pay | Admitting: Primary Care

## 2021-11-15 NOTE — Telephone Encounter (Signed)
Sent to PCP ?

## 2021-12-19 ENCOUNTER — Ambulatory Visit (INDEPENDENT_AMBULATORY_CARE_PROVIDER_SITE_OTHER): Payer: Medicare Other | Admitting: Primary Care

## 2022-01-01 ENCOUNTER — Encounter (INDEPENDENT_AMBULATORY_CARE_PROVIDER_SITE_OTHER): Payer: Self-pay | Admitting: Primary Care

## 2022-01-01 ENCOUNTER — Other Ambulatory Visit: Payer: Self-pay

## 2022-01-01 ENCOUNTER — Ambulatory Visit (INDEPENDENT_AMBULATORY_CARE_PROVIDER_SITE_OTHER): Payer: Commercial Managed Care - HMO | Admitting: Primary Care

## 2022-01-01 VITALS — BP 108/77 | HR 83 | Temp 97.7°F | Ht 68.0 in | Wt 203.6 lb

## 2022-01-01 DIAGNOSIS — Z760 Encounter for issue of repeat prescription: Secondary | ICD-10-CM

## 2022-01-01 DIAGNOSIS — I10 Essential (primary) hypertension: Secondary | ICD-10-CM

## 2022-01-01 DIAGNOSIS — H6122 Impacted cerumen, left ear: Secondary | ICD-10-CM

## 2022-01-01 MED ORDER — HYDROCHLOROTHIAZIDE 25 MG PO TABS: 25.0000 mg | ORAL_TABLET | Freq: Every day | ORAL | 1 refills | Status: DC

## 2022-01-01 MED ORDER — AMLODIPINE BESYLATE 10 MG PO TABS: ORAL_TABLET | ORAL | 1 refills | Status: DC

## 2022-01-01 NOTE — Progress Notes (Signed)
[facility]  Subjective:    Jared Rhodes. is a 71 y.o. male whom I am asked to see for evaluation of diminished hearing in the right ear for the past 2 weeks. There is not a prior history of cerumen impaction. The patient has not been using ear drops to loosen wax immediately prior to this visit. The patient denies ear pain.  The patient's history has been marked as reviewed and updated as appropriate. allergies, current medications, past family history, past medical history, past social history, past surgical history, and problem list Past Medical History:  Diagnosis Date   Hypertension    Prostate cancer ([place]) 2018   radation 40 treatments    Past Surgical History:  Procedure Laterality Date   I & D EXTREMITY Left 07/17/2014   Procedure: IRRIGATION AND DEBRIDEMENT EXTREMITY;  Surgeon: Marianna Payment, MD;  Location: [place];  Service: Orthopedics;  Laterality: Left;   I & D EXTREMITY Left 07/19/2014   Procedure: IRRIGATION AND DEBRIDEMENT EXTREMITY;  Surgeon: Marianna Payment, MD;  Location: [place];  Service: Orthopedics;  Laterality: Left;   LYMPHADENECTOMY Bilateral 09/25/2021   Procedure: LYMPHADENECTOMY, PELVIC;  Surgeon: Raynelle Bring, MD;  Location: [facility];  Service: Urology;  Laterality: Bilateral;   ORIF ANKLE FRACTURE Left 07/19/2014   Procedure: OPEN REDUCTION INTERNAL FIXATION (ORIF) ANKLE FRACTURE;  Surgeon: Marianna Payment, MD;  Location: [place];  Service: Orthopedics;  Laterality: Left;   PROSTATE BIOPSY     ROBOT ASSISTED LAPAROSCOPIC RADICAL PROSTATECTOMY N/A 09/25/2021   Procedure: XI ROBOTIC ASSISTED LAPAROSCOPIC RADICAL PROSTATECTOMY LEVEL 3;  Surgeon: Raynelle Bring, MD;  Location: [facility];  Service: Urology;  Laterality: N/A;   TONSILLECTOMY     TOTAL HIP ARTHROPLASTY Right 03/02/2016   Procedure: RIGHT TOTAL HIP ARTHROPLASTY ANTERIOR APPROACH;  Surgeon: Leandrew Koyanagi, MD;  Location: [place];  Service: Orthopedics;  Laterality: Right;    Family History  Problem Relation Age of Onset   Alcohol abuse Mother    Heart disease Sister    Hyperlipidemia Brother    Colon cancer Neg Hx    Breast cancer Neg Hx    Prostate cancer Neg Hx    Pancreatic cancer Neg Hx    Social History   Socioeconomic History   Marital status: Divorced    Spouse name: n/a   Number of children: 0   Years of education: 11th grade   Highest education level: Not on file  Occupational History   Occupation: CAB DRIVER    Employer: [employer] engineer [employer]   Occupation: truck Geophysicist/field seismologist  Tobacco Use   Smoking status: Some Days    Packs/day: 1.00    Years: 30.00    Pack years: 30.00    Types: Cigarettes   Smokeless tobacco: Never  Vaping Use   Vaping Use: Never used  Substance and Sexual Activity   Alcohol use: Yes    Alcohol/week: 12.0 standard drinks    Types: 12 Cans of beer per week    Comment: 12 beers per week per pt.    Drug use: No   Sexual activity: Yes    Partners: Female  Other Topics Concern   Not on file  Social History Narrative   Lives alone. 2 brothers and 1 sister live in [place].  1 half-sister lives in [place].   Social Determinants of Health   Financial Resource Strain: Low Risk    Difficulty of Paying Living Expenses: Not hard at all  Food Insecurity: Food Insecurity  Present   Worried About Charity fundraiser in the Last Year: Sometimes true   Ran Out of Food in the Last Year: Never true  Transportation Needs: No Transportation Needs   Lack of Transportation (Medical): No   Lack of Transportation (Non-Medical): No  Physical Activity: Sufficiently Active   Days of Exercise per Week: 7 days   Minutes of Exercise per Session: 60 min  Stress: No Stress Concern Present   Feeling of Stress : Not at all  Social Connections: Moderately Isolated   Frequency of Communication with Friends and Family: More than three times a week   Frequency of Social Gatherings with Friends and Family: More than three times a week   Attends  Religious Services: Never   Marine scientist or Organizations: No   Attends Archivist Meetings: Never   Marital Status: Living with partner   Current Outpatient Medications  Medication Sig Dispense Refill   acetaminophen (TYLENOL) 325 MG tablet Take 650 mg by mouth every 6 (six) hours as needed for moderate pain.     amLODipine (NORVASC) 10 MG tablet TAKE 1 TABLET(10 MG) BY MOUTH DAILY 90 tablet 0   docusate sodium (COLACE) 100 MG capsule Take 1 capsule (100 mg total) by mouth 2 (two) times daily.     hydrochlorothiazide (HYDRODIURIL) 25 MG tablet Take 1 tablet (25 mg total) by mouth daily. May take and additional tablet for increase edema in feet. 90 tablet 3   traMADol (ULTRAM) 50 MG tablet Take 1-2 tablets (50-100 mg total) by mouth every 6 (six) hours as needed for moderate pain or severe pain. 20 tablet 0   No current facility-administered medications for this visit.   Allergies: Penicillins The patient had a large amount of cerumen in the external auditory canal(s): right Ear wax softener was used prior to the lavage. Ear lavage was performed on left ear(s)  Curettage by provider was not performed in addition.  There were no complications and following the disimpaction the tympanic membrane was visible. Charges to be entered in Charge Capture section.   Review of Systems Pertinent items noted in HPI and remainder of comprehensive ROS otherwise negative.    Objective:    Auditory canal(s) of the left ear are completely obstructed with cerumen.   Cerumen was removed using gentle irrigation. Tympanic membranes are intact following the procedure.  Auditory canals are normal.   Physical exam: General: Vital signs reviewed.  Patient is well-developed and well-nourished, obese male  in no acute distress and cooperative with exam. Head: Normocephalic and atraumatic. Left ear cerumen impaction  Eyes: EOMI, conjunctivae normal, no scleral icterus. Neck: Supple, trachea  midline, normal ROM, no JVD, masses, thyromegaly, or carotid bruit present. Cardiovascular: RRR, S1 normal, S2 normal, no murmurs, gallops, or rubs. Pulmonary/Chest: Clear to auscultation bilaterally, no wheezes, rales, or rhonchi. Abdominal: Soft, non-tender, non-distended, BS +, no masses, organomegaly, or guarding present. Neurological: A&O x3, Strength is normal Skin: Warm, dry and intact. No rashes or erythema. Psychiatric: Normal mood and affect. speech and behavior is normal. Cognition and memory are normal.    Assessment:   Jared Rhodes was seen today for cerumen impaction.  Diagnoses and all orders for this visit:  Impacted cerumen of left ear   Cerumen Impaction without otitis externa.    1. Care instructions given. 2. Home treatment: none. 3. Follow-up as needed.  Medication Refills -     amLODipine (NORVASC) 10 MG tablet; TAKE 1 TABLET(10 MG) BY MOUTH DAILY -  hydrochlorothiazide (HYDRODIURIL) 25 MG tablet; Take 1 tablet (25 mg total) by mouth daily.    Jared Rhodes

## 2022-03-20 ENCOUNTER — Ambulatory Visit (INDEPENDENT_AMBULATORY_CARE_PROVIDER_SITE_OTHER): Payer: Medicare Other

## 2022-03-20 ENCOUNTER — Ambulatory Visit (INDEPENDENT_AMBULATORY_CARE_PROVIDER_SITE_OTHER): Payer: Medicare Other | Admitting: Orthopaedic Surgery

## 2022-03-20 ENCOUNTER — Encounter: Payer: Self-pay | Admitting: Orthopaedic Surgery

## 2022-03-20 ENCOUNTER — Other Ambulatory Visit: Payer: Self-pay

## 2022-03-20 DIAGNOSIS — M25572 Pain in left ankle and joints of left foot: Secondary | ICD-10-CM

## 2022-03-20 NOTE — Progress Notes (Signed)
? ?Office Visit Note ?  ?Patient: Jared Rhodes.           ?Date of Birth: 1951/11/18           ?MRN: [id number] ?Visit Date: 03/20/2022 ?             ?Requested by: Kerin Perna, NP ?[id number] ?[place],  [place] [address] ?PCP: Kerin Perna, NP ? ? ?Assessment & Plan: ?Visit Diagnoses:  ?1. Pain in left ankle and joints of left foot   ? ? ?Plan: Impression is a left small toe growth versus untreated open fracture of the proximal phalanx.  X-rays are unrevealing today and I have looked at previous x-rays which are also unrevealing.  We will need a CT scan of the toe to get a better idea of what is going on.  Follow-up after the CT scan. ? ?Follow-Up Instructions: No follow-ups on file.  ? ?Orders:  ?Orders Placed This Encounter  ?Procedures  ? XR Ankle Complete Left  ? ?No orders of the defined types were placed in this encounter. ? ? ? ? Procedures: ?No procedures performed ? ? ?Clinical Data: ?No additional findings. ? ? ?Subjective: ?Chief Complaint  ?Patient presents with  ? Left Ankle - Pain  ? ? ?HPI ? ?Jared Rhodes is coming in today for evaluation of a painful growth to the left small toe.  He has noticed it for a few months.  Denies any injuries.  It affects his ability to walk and wear shoes.  He has been using a postop shoe. ? ?Review of Systems  ?Constitutional: Negative.   ?All other systems reviewed and are negative. ? ? ?Objective: ?Vital Signs: There were no vitals taken for this visit. ? ?Physical Exam ?Vitals and nursing note reviewed.  ?Constitutional:   ?   Appearance: He is well-developed.  ?Pulmonary:  ?   Effort: Pulmonary effort is normal.  ?Abdominal:  ?   Palpations: Abdomen is soft.  ?Skin: ?   General: Skin is warm.  ?Neurological:  ?   Mental Status: He is alert and oriented to person, place, and time.  ?Psychiatric:     ?   Behavior: Behavior normal.     ?   Thought Content: Thought content normal.     ?   Judgment: Judgment normal.  ? ? ?Ortho  Exam ? ?Examination of the left small toe shows no growth from the medial aspect.  The appearance consistent with either a callus or possibly protruding phalanx.  There is no drainage.  There is healed skin around it.  No signs of infection.  It is very tender to touch and to manipulation.  No neurovascular compromise to this toe. ? ?Specialty Comments:  ?No specialty comments available. ? ?Imaging: ?XR Ankle Complete Left ? ?Result Date: 03/20/2022 ?Status post ORIF left ankle without hardware complications.  Calcified syndesmosis.  ? ? ?PMFS History: ?Patient Active Problem List  ? Diagnosis Date Noted  ? Prostate cancer ([place]) 02/01/2017  ? Osteoarthritis of right hip 03/02/2016  ? Hip joint replacement status 03/02/2016  ? Alcohol intoxication ([place]) 07/17/2014  ? Type III open fracture dislocation of left ankle joint 07/17/2014  ? HTN (hypertension) 04/17/2014  ? Severe obesity (BMI >= 40) ([place]) 04/17/2014  ? ?Past Medical History:  ?Diagnosis Date  ? Hypertension   ? Prostate cancer ([place]) 2018  ? radation 40 treatments   ?  ?Family History  ?Problem Relation Age of Onset  ? Alcohol abuse Mother   ?  Heart disease Sister   ? Hyperlipidemia Brother   ? Colon cancer Neg Hx   ? Breast cancer Neg Hx   ? Prostate cancer Neg Hx   ? Pancreatic cancer Neg Hx   ?  ?Past Surgical History:  ?Procedure Laterality Date  ? I & D EXTREMITY Left 07/17/2014  ? Procedure: IRRIGATION AND DEBRIDEMENT EXTREMITY;  Surgeon: Marianna Payment, MD;  Location: [place];  Service: Orthopedics;  Laterality: Left;  ? I & D EXTREMITY Left 07/19/2014  ? Procedure: IRRIGATION AND DEBRIDEMENT EXTREMITY;  Surgeon: Marianna Payment, MD;  Location: [place];  Service: Orthopedics;  Laterality: Left;  ? LYMPHADENECTOMY Bilateral 09/25/2021  ? Procedure: LYMPHADENECTOMY, PELVIC;  Surgeon: Raynelle Bring, MD;  [facility];  Service: Urology;  Laterality: Bilateral;  ? ORIF ANKLE FRACTURE Left 07/19/2014  ? Procedure: OPEN REDUCTION INTERNAL FIXATION  (ORIF) ANKLE FRACTURE;  Surgeon: Marianna Payment, MD;  Location: [place];  Service: Orthopedics;  Laterality: Left;  ? PROSTATE BIOPSY    ? ROBOT ASSISTED LAPAROSCOPIC RADICAL PROSTATECTOMY N/A 09/25/2021  ? Procedure: XI ROBOTIC ASSISTED LAPAROSCOPIC RADICAL PROSTATECTOMY LEVEL 3;  Surgeon: Raynelle Bring, MD;  [facility];  Service: Urology;  Laterality: N/A;  ? TONSILLECTOMY    ? TOTAL HIP ARTHROPLASTY Right 03/02/2016  ? Procedure: RIGHT TOTAL HIP ARTHROPLASTY ANTERIOR APPROACH;  Surgeon: Leandrew Koyanagi, MD;  Location: [place];  Service: Orthopedics;  Laterality: Right;  ? ?Social History  ? ?Occupational History  ? Occupation: CAB DRIVER  ?  Employer: [employer]  ? Occupation: truck Geophysicist/field seismologist  ?Tobacco Use  ? Smoking status: Some Days  ?  Packs/day: 1.00  ?  Years: 30.00  ?  Pack years: 30.00  ?  Types: Cigarettes  ? Smokeless tobacco: Never  ?Vaping Use  ? Vaping Use: Never used  ?Substance and Sexual Activity  ? Alcohol use: Yes  ?  Alcohol/week: 12.0 standard drinks  ?  Types: 12 Cans of beer per week  ?  Comment: 12 beers per week per pt.   ? Drug use: No  ? Sexual activity: Yes  ?  Partners: Female  ? ? ? ? ? ? ?

## 2022-03-21 ENCOUNTER — Telehealth: Payer: Self-pay | Admitting: Orthopaedic Surgery

## 2022-03-21 ENCOUNTER — Other Ambulatory Visit: Payer: Self-pay

## 2022-03-21 DIAGNOSIS — M25572 Pain in left ankle and joints of left foot: Secondary | ICD-10-CM

## 2022-03-21 NOTE — Telephone Encounter (Signed)
Pt called about his CT Scan because he needs to set up his [facility] ride. He needs to know 3 days ahead.  ? ?CB [phone] ?

## 2022-03-22 NOTE — Telephone Encounter (Signed)
Pt is already scheduled and enough time for SCAT transportation ?

## 2022-03-30 ENCOUNTER — Inpatient Hospital Stay: Admission: RE | Admit: 2022-03-30 | Payer: Medicare Other | Source: Ambulatory Visit

## 2022-04-02 ENCOUNTER — Ambulatory Visit (INDEPENDENT_AMBULATORY_CARE_PROVIDER_SITE_OTHER): Payer: Commercial Managed Care - HMO | Admitting: Primary Care

## 2022-04-12 ENCOUNTER — Ambulatory Visit: Payer: Medicare Other | Admitting: Orthopaedic Surgery

## 2022-04-18 ENCOUNTER — Other Ambulatory Visit (INDEPENDENT_AMBULATORY_CARE_PROVIDER_SITE_OTHER): Payer: Self-pay | Admitting: Primary Care

## 2022-04-18 DIAGNOSIS — Z760 Encounter for issue of repeat prescription: Secondary | ICD-10-CM

## 2022-04-18 NOTE — Telephone Encounter (Signed)
Medication Refill - Medication: amLODipine (NORVASC) 10 MG tablet  ? ?Has the patient contacted their pharmacy? Yes.   ?(Agent: If no, request that the patient contact the pharmacy for the refill. If patient does not wish to contact the pharmacy document the reason why and proceed with request.) ?(Agent: If yes, when and what did the pharmacy advise?) ? ?Preferred Pharmacy (with phone number or street name):  ?[place], [address] [facility]  ?[place] [address]  ?Phone: [phone] Fax: [phone]  ? ?Has the patient been seen for an appointment in the last year OR does the patient have an upcoming appointment? Yes.   ? ?Agent: Please be advised that RX refills may take up to 3 business days. We ask that you follow-up with your pharmacy. ? ?

## 2022-04-19 NOTE — Telephone Encounter (Signed)
Rx 01/01/22 #90 1RF- 6 month supply ?Too soon ?Requested Prescriptions  ?Pending Prescriptions Disp Refills  ?? amLODipine (NORVASC) 10 MG tablet 90 tablet 1  ?  Sig: TAKE 1 TABLET(10 MG) BY MOUTH DAILY  ?  ? Cardiovascular: Calcium Channel Blockers 2 Passed - 04/18/2022 12:34 PM  ?  ?  Passed - Last BP in normal range  ?  BP Readings from Last 1 Encounters:  ?01/01/22 108/77  ?   ?  ?  Passed - Last Heart Rate in normal range  ?  Pulse Readings from Last 1 Encounters:  ?01/01/22 83  ?   ?  ?  Passed - Valid encounter within last 6 months  ?  Recent Outpatient Visits   ?      ? 3 months ago Impacted cerumen of left ear  ? [facility] Kerin Perna, NP  ? 5 months ago Encounter for [employer] annual wellness exam  ? [facility] Kerin Perna, NP  ? 1 year ago Primary hypertension  ? [facility] Kerin Perna, NP  ? 1 year ago Primary hypertension  ? [facility] Kerin Perna, NP  ? 8 years ago Health examination of defined subpopulation  ? Primary Care at [facility] [place], [place]  ?  ?  ?Future Appointments   ?        ? In 3 weeks Kerin Perna, NP [place]  ?  ? ?  ?  ?  ? ?

## 2022-04-24 ENCOUNTER — Other Ambulatory Visit: Payer: Medicare Other

## 2022-05-14 ENCOUNTER — Other Ambulatory Visit: Payer: Medicare Other

## 2022-05-14 ENCOUNTER — Ambulatory Visit (INDEPENDENT_AMBULATORY_CARE_PROVIDER_SITE_OTHER): Payer: Self-pay

## 2022-05-14 NOTE — Telephone Encounter (Signed)
  Chief Complaint: Left foot edema Symptoms: Left foot edema and throbbing pain Frequency: 3 days Pertinent Negatives: Patient denies Chest pain fever. Disposition: '[]'$ ED /'[x]'$ Urgent Care (no appt availability in office) / '[]'$ Appointment(In office/virtual)/ '[]'$  [facility]/ '[]'$ Home Care/ '[]'$ Refused Recommended Disposition /'[]'$ [facility]/ '[]'$  Follow-up with PCP Additional Notes: PT has had left leg swelling and pain for 3 days. PT cannot wear a shoe.  Reason for Disposition  [1] Thigh, calf, or ankle swelling AND [2] only 1 side  Answer Assessment - Initial Assessment Questions 1. ONSET: "When did the swelling start?" (e.g., minutes, hours, days)     3 days 2. LOCATION: "What part of the leg is swollen?"  "Are both legs swollen or just one leg?"     Left legs 3. SEVERITY: "How bad is the swelling?" (e.g., localized; mild, moderate, severe)  - Localized - small area of swelling localized to one leg  - MILD pedal edema - swelling limited to foot and ankle, pitting edema < 1/4 inch (6 mm) deep, rest and elevation eliminate most or all swelling  - MODERATE edema - swelling of lower leg to knee, pitting edema > 1/4 inch (6 mm) deep, rest and elevation only partially reduce swelling  - SEVERE edema - swelling extends above knee, facial or hand swelling present      moderate 4. REDNESS: "Does the swelling look red or infected?"     no 5. PAIN: "Is the swelling painful to touch?" If Yes, ask: "How painful is it?"   (Scale 1-10; mild, moderate or severe)     8-9/10 6. FEVER: "Do you have a fever?" If Yes, ask: "What is it, how was it measured, and when did it start?"      no 7. CAUSE: "What do you think is causing the leg swelling?"     unsure 8. MEDICAL HISTORY: "Do you have a history of heart failure, kidney disease, liver failure, or cancer?"     no 9. RECURRENT SYMPTOM: "Have you had leg swelling before?" If Yes, ask: "When was the last time?" "What happened that time?"      no 10. OTHER SYMPTOMS: "Do you have any other symptoms?" (e.g., chest pain, difficulty breathing)       none 11. PREGNANCY: "Is there any chance you are pregnant?" "When was your last menstrual period?"       na  Protocols used: Leg Swelling and Edema-A-AH

## 2022-05-14 NOTE — Telephone Encounter (Signed)
Please discuss at [place] on tomorrow

## 2022-05-15 ENCOUNTER — Ambulatory Visit (INDEPENDENT_AMBULATORY_CARE_PROVIDER_SITE_OTHER): Payer: Commercial Managed Care - HMO | Admitting: Primary Care

## 2022-07-24 ENCOUNTER — Encounter (INDEPENDENT_AMBULATORY_CARE_PROVIDER_SITE_OTHER): Payer: Self-pay | Admitting: Primary Care

## 2022-07-24 ENCOUNTER — Ambulatory Visit (INDEPENDENT_AMBULATORY_CARE_PROVIDER_SITE_OTHER): Payer: Medicare Other | Admitting: Primary Care

## 2022-07-24 VITALS — BP 113/80 | HR 65 | Temp 98.4°F | Ht 68.0 in | Wt 221.0 lb

## 2022-07-24 DIAGNOSIS — I10 Essential (primary) hypertension: Secondary | ICD-10-CM

## 2022-07-24 DIAGNOSIS — Z760 Encounter for issue of repeat prescription: Secondary | ICD-10-CM

## 2022-07-24 MED ORDER — AMLODIPINE BESYLATE 10 MG PO TABS: ORAL_TABLET | ORAL | 1 refills | Status: DC

## 2022-07-24 MED ORDER — HYDROCHLOROTHIAZIDE 25 MG PO TABS: 25.0000 mg | ORAL_TABLET | Freq: Every day | ORAL | 1 refills | Status: DC

## 2022-07-24 NOTE — Patient Instructions (Signed)
Calorie Counting for Weight Loss Calories are units of energy. Your body needs a certain number of calories from food to keep going throughout the day. When you eat or drink more calories than your body needs, your body stores the extra calories mostly as fat. When you eat or drink fewer calories than your body needs, your body burns fat to get the energy it needs. Calorie counting means keeping track of how many calories you eat and drink each day. Calorie counting can be helpful if you need to lose weight. If you eat fewer calories than your body needs, you should lose weight. Ask your health care provider what a healthy weight is for you. For calorie counting to work, you will need to eat the right number of calories each day to lose a healthy amount of weight per week. A dietitian can help you figure out how many calories you need in a day and will suggest ways to reach your calorie goal. A healthy amount of weight to lose each week is usually 1-2 lb (0.5-0.9 kg). This usually means that your daily calorie intake should be reduced by 500-750 calories. Eating 1,200-1,500 calories a day can help most women lose weight. Eating 1,500-1,800 calories a day can help most men lose weight. What do I need to know about calorie counting? Work with your health care provider or dietitian to determine how many calories you should get each day. To meet your daily calorie goal, you will need to: Find out how many calories are in each food that you would like to eat. Try to do this before you eat. Decide how much of the food you plan to eat. Keep a food log. Do this by writing down what you ate and how many calories it had. To successfully lose weight, it is important to balance calorie counting with a healthy lifestyle that includes regular activity. Where do I find calorie information?  The number of calories in a food can be found on a Nutrition Facts label. If a food does not have a Nutrition Facts label, try  to look up the calories online or ask your dietitian for help. Remember that calories are listed per serving. If you choose to have more than one serving of a food, you will have to multiply the calories per serving by the number of servings you plan to eat. For example, the label on a package of bread might say that a serving size is 1 slice and that there are 90 calories in a serving. If you eat 1 slice, you will have eaten 90 calories. If you eat 2 slices, you will have eaten 180 calories. How do I keep a food log? After each time that you eat, record the following in your food log as soon as possible: What you ate. Be sure to include toppings, sauces, and other extras on the food. How much you ate. This can be measured in cups, ounces, or number of items. How many calories were in each food and drink. The total number of calories in the food you ate. Keep your food log near you, such as in a pocket-sized notebook or on an app or website on your mobile phone. Some programs will calculate calories for you and show you how many calories you have left to meet your daily goal. What are some portion-control tips? Know how many calories are in a serving. This will help you know how many servings you can have of a certain   food. Use a measuring cup to measure serving sizes. You could also try weighing out portions on a kitchen scale. With time, you will be able to estimate serving sizes for some foods. Take time to put servings of different foods on your favorite plates or in your favorite bowls and cups so you know what a serving looks like. Try not to eat straight from a food's packaging, such as from a bag or box. Eating straight from the package makes it hard to see how much you are eating and can lead to overeating. Put the amount you would like to eat in a cup or on a plate to make sure you are eating the right portion. Use smaller plates, glasses, and bowls for smaller portions and to prevent  overeating. Try not to multitask. For example, avoid watching TV or using your computer while eating. If it is time to eat, sit down at a table and enjoy your food. This will help you recognize when you are full. It will also help you be more mindful of what and how much you are eating. What are tips for following this plan? Reading food labels Check the calorie count compared with the serving size. The serving size may be smaller than what you are used to eating. Check the source of the calories. Try to choose foods that are high in protein, fiber, and vitamins, and low in saturated fat, trans fat, and sodium. Shopping Read nutrition labels while you shop. This will help you make healthy decisions about which foods to buy. Pay attention to nutrition labels for low-fat or fat-free foods. These foods sometimes have the same number of calories or more calories than the full-fat versions. They also often have added sugar, starch, or salt to make up for flavor that was removed with the fat. Make a grocery list of lower-calorie foods and stick to it. Cooking Try to cook your favorite foods in a healthier way. For example, try baking instead of frying. Use low-fat dairy products. Meal planning Use more fruits and vegetables. One-half of your plate should be fruits and vegetables. Include lean proteins, such as chicken, turkey, and fish. Lifestyle Each week, aim to do one of the following: 150 minutes of moderate exercise, such as walking. 75 minutes of vigorous exercise, such as running. General information Know how many calories are in the foods you eat most often. This will help you calculate calorie counts faster. Find a way of tracking calories that works for you. Get creative. Try different apps or programs if writing down calories does not work for you. What foods should I eat?  Eat nutritious foods. It is better to have a nutritious, high-calorie food, such as an avocado, than a food with  few nutrients, such as a bag of potato chips. Use your calories on foods and drinks that will fill you up and will not leave you hungry soon after eating. Examples of foods that fill you up are nuts and nut butters, vegetables, lean proteins, and high-fiber foods such as whole grains. High-fiber foods are foods with more than 5 g of fiber per serving. Pay attention to calories in drinks. Low-calorie drinks include water and unsweetened drinks. The items listed above may not be a complete list of foods and beverages you can eat. Contact a dietitian for more information. What foods should I limit? Limit foods or drinks that are not good sources of vitamins, minerals, or protein or that are high in unhealthy fats. These   include: Candy. Other sweets. Sodas, specialty coffee drinks, alcohol, and juice. The items listed above may not be a complete list of foods and beverages you should avoid. Contact a dietitian for more information. How do I count calories when eating out? Pay attention to portions. Often, portions are much larger when eating out. Try these tips to keep portions smaller: Consider sharing a meal instead of getting your own. If you get your own meal, eat only half of it. Before you start eating, ask for a container and put half of your meal into it. When available, consider ordering smaller portions from the menu instead of full portions. Pay attention to your food and drink choices. Knowing the way food is cooked and what is included with the meal can help you eat fewer calories. If calories are listed on the menu, choose the lower-calorie options. Choose dishes that include vegetables, fruits, whole grains, low-fat dairy products, and lean proteins. Choose items that are boiled, broiled, grilled, or steamed. Avoid items that are buttered, battered, fried, or served with cream sauce. Items labeled as crispy are usually fried, unless stated otherwise. Choose water, low-fat milk,  unsweetened iced tea, or other drinks without added sugar. If you want an alcoholic beverage, choose a lower-calorie option, such as a glass of wine or light beer. Ask for dressings, sauces, and syrups on the side. These are usually high in calories, so you should limit the amount you eat. If you want a salad, choose a garden salad and ask for grilled meats. Avoid extra toppings such as bacon, cheese, or fried items. Ask for the dressing on the side, or ask for olive oil and vinegar or lemon to use as dressing. Estimate how many servings of a food you are given. Knowing serving sizes will help you be aware of how much food you are eating at restaurants. Where to find more information Centers for Disease Control and Prevention: www.cdc.gov U.S. Department of Agriculture: myplate.gov Summary Calorie counting means keeping track of how many calories you eat and drink each day. If you eat fewer calories than your body needs, you should lose weight. A healthy amount of weight to lose per week is usually 1-2 lb (0.5-0.9 kg). This usually means reducing your daily calorie intake by 500-750 calories. The number of calories in a food can be found on a Nutrition Facts label. If a food does not have a Nutrition Facts label, try to look up the calories online or ask your dietitian for help. Use smaller plates, glasses, and bowls for smaller portions and to prevent overeating. Use your calories on foods and drinks that will fill you up and not leave you hungry shortly after a meal. This information is not intended to replace advice given to you by your health care provider. Make sure you discuss any questions you have with your health care provider. Document Revised: 01/21/2020 Document Reviewed: 01/21/2020 Elsevier Patient Education  2023 Elsevier Inc.  

## 2022-07-24 NOTE — Progress Notes (Signed)
Complains of limp when driving/walking due to plate in left foot would like handicap placard

## 2022-07-24 NOTE — Progress Notes (Signed)
Jared Rhodes. is a 71 y.o. male presents for hypertension evaluation, Denies shortness of breath, headaches, chest pain or lower extremity edema, sudden onset, vision changes, unilateral weakness, dizziness, paresthesias   Patient reports adherence with medications.  Dietary habits include: sodium  Exercise habits include:walking  Family / Social history: No   Past Medical History:  Diagnosis Date   Hypertension    Prostate cancer ([facility]) 2018   radation 40 treatments    Past Surgical History:  Procedure Laterality Date   I & D EXTREMITY Left 07/17/2014   Procedure: IRRIGATION AND DEBRIDEMENT EXTREMITY;  Surgeon: Marianna Payment, MD;  Location: [facility];  Service: Orthopedics;  Laterality: Left;   I & D EXTREMITY Left 07/19/2014   Procedure: IRRIGATION AND DEBRIDEMENT EXTREMITY;  Surgeon: Marianna Payment, MD;  Location: [facility];  Service: Orthopedics;  Laterality: Left;   LYMPHADENECTOMY Bilateral 09/25/2021   Procedure: LYMPHADENECTOMY, PELVIC;  Surgeon: Raynelle Bring, MD;  Location: [facility];  Service: Urology;  Laterality: Bilateral;   ORIF ANKLE FRACTURE Left 07/19/2014   Procedure: OPEN REDUCTION INTERNAL FIXATION (ORIF) ANKLE FRACTURE;  Surgeon: Marianna Payment, MD;  Location: [facility];  Service: Orthopedics;  Laterality: Left;   PROSTATE BIOPSY     ROBOT ASSISTED LAPAROSCOPIC RADICAL PROSTATECTOMY N/A 09/25/2021   Procedure: XI ROBOTIC ASSISTED LAPAROSCOPIC RADICAL PROSTATECTOMY LEVEL 3;  Surgeon: Raynelle Bring, MD;  Location: [facility];  Service: Urology;  Laterality: N/A;   TONSILLECTOMY     TOTAL HIP ARTHROPLASTY Right 03/02/2016   Procedure: RIGHT TOTAL HIP ARTHROPLASTY ANTERIOR APPROACH;  Surgeon: Leandrew Koyanagi, MD;  Location: [facility];  Service: Orthopedics;  Laterality: Right;   Allergies  Allergen Reactions   Penicillins Rash    Has patient had a PCN reaction causing immediate rash, facial/tongue/throat swelling, SOB or lightheadedness  with hypotension: UnknowN Has patient had a PCN reaction causing severe rash involving mucus membranes or skin necrosis: UnknowN Has patient had a PCN reaction that required hospitalization: Unknown Has patient had a PCN reaction occurring within the last 10 years: /Unknown If all of the above answers are "NO", then may proceed with Cephalosporin use.    Current Outpatient Medications on File Prior to Visit  Medication Sig Dispense Refill   amLODipine (NORVASC) 10 MG tablet TAKE 1 TABLET(10 MG) BY MOUTH DAILY 90 tablet 1   hydrochlorothiazide (HYDRODIURIL) 25 MG tablet Take 1 tablet (25 mg total) by mouth daily. May take and additional tablet for increase edema in feet. 90 tablet 1   acetaminophen (TYLENOL) 325 MG tablet Take 650 mg by mouth every 6 (six) hours as needed for moderate pain.     docusate sodium (COLACE) 100 MG capsule Take 1 capsule (100 mg total) by mouth 2 (two) times daily.     No current facility-administered medications on file prior to visit.   Social History   Socioeconomic History   Marital status: Divorced    Spouse name: n/a   Number of children: 0   Years of education: 11th grade   Highest education level: Not on file  Occupational History   Occupation: CAB DRIVER    Employer: [employer]   Occupation: truck Geophysicist/field seismologist  Tobacco Use   Smoking status: Some Days    Packs/day: 1.00    Years: 30.00    Total pack years: 30.00    Types: Cigarettes   Smokeless tobacco: Never  Vaping Use   Vaping Use: Never used  Substance and Sexual  Activity   Alcohol use: Yes    Alcohol/week: 12.0 standard drinks of alcohol    Types: 12 Cans of beer per week    Comment: 12 beers per week per pt.    Drug use: No   Sexual activity: Yes    Partners: Female  Other Topics Concern   Not on file  Social History Narrative   Lives alone. 2 brothers and 1 sister live in [place].  1 half-sister lives in [place].   Social Determinants of Health   Financial Resource Strain:  Low Risk  (11/09/2021)   Overall Financial Resource Strain (CARDIA)    Difficulty of Paying Living Expenses: Not hard at all  Food Insecurity: Food Insecurity Present (11/09/2021)   Hunger Vital Sign    Worried About Running Out of Food in the Last Year: Sometimes true    Ran Out of Food in the Last Year: Never true  Transportation Needs: No Transportation Needs (11/09/2021)   PRAPARE - Hydrologist (Medical): No    Lack of Transportation (Non-Medical): No  Physical Activity: Sufficiently Active (11/09/2021)   Exercise Vital Sign    Days of Exercise per Week: 7 days    Minutes of Exercise per Session: 60 min  Stress: No Stress Concern Present (11/09/2021)   [place]    Feeling of Stress : Not at all  Social Connections: Moderately Isolated (11/09/2021)   Social Connection and Isolation Panel [NHANES]    Frequency of Communication with Friends and Family: More than three times a week    Frequency of Social Gatherings with Friends and Family: More than three times a week    Attends Religious Services: Never    Marine scientist or Organizations: No    Attends Archivist Meetings: Never    Marital Status: Living with partner  Intimate Partner Violence: Not At Risk (11/09/2021)   Humiliation, Afraid, Rape, and Kick questionnaire    Fear of Current or Ex-Partner: No    Emotionally Abused: No    Physically Abused: No    Sexually Abused: No   Family History  Problem Relation Age of Onset   Alcohol abuse Mother    Heart disease Sister    Hyperlipidemia Brother    Colon cancer Neg Hx    Breast cancer Neg Hx    Prostate cancer Neg Hx    Pancreatic cancer Neg Hx      OBJECTIVE:  Vitals:   07/24/22 1456  BP: 113/80  Pulse: 65  Temp: 98.4 F (36.9 C)  TempSrc: Oral  SpO2: 97%  Weight: 221 lb (100.2 kg)  Height: '5\' 8"'$  (1.727 m)    Physical Exam Vitals reviewed.   Constitutional:      Appearance: He is obese.  HENT:     Head: Normocephalic.     Right Ear: Tympanic membrane and external ear normal.     Left Ear: Tympanic membrane and external ear normal.     Nose: Nose normal.  Eyes:     Extraocular Movements: Extraocular movements intact.     Pupils: Pupils are equal, round, and reactive to light.  Cardiovascular:     Rate and Rhythm: Normal rate and regular rhythm.  Pulmonary:     Effort: Pulmonary effort is normal.     Breath sounds: Normal breath sounds.  Abdominal:     General: Bowel sounds are normal. There is distension.     Palpations: Abdomen  is soft.  Musculoskeletal:        General: Normal range of motion.     Cervical back: Normal range of motion and neck supple.  Skin:    General: Skin is warm and dry.  Neurological:     Mental Status: He is alert and oriented to person, place, and time.  Psychiatric:        Mood and Affect: Mood normal.        Behavior: Behavior normal.    ROS Comprehensive ROS Pertinent positive and negative noted in HPI   Last 3 Office BP readings: BP Readings from Last 3 Encounters:  07/24/22 113/80  01/01/22 108/77  11/09/21 111/77    BMET    Component Value Date/Time   NA 145 09/22/2021 0833   NA 143 02/07/2021 0936   K 4.1 09/22/2021 0833   CL 114 (H) 09/22/2021 0833   CO2 25 09/22/2021 0833   GLUCOSE 90 09/22/2021 0833   BUN 18 09/22/2021 0833   BUN 14 02/07/2021 0936   CREATININE 0.81 09/22/2021 0833   CALCIUM 9.2 09/22/2021 0833   GFRNONAA >60 09/22/2021 0833   GFRAA 105 02/07/2021 0936    Renal function: CrCl cannot be calculated (Patient's most recent lab result is older than the maximum 21 days allowed.).  Clinical ASCVD: Yes  The 10-year ASCVD risk score (Arnett DK, et al., 2019) is: 22.8%   Values used to calculate the score:     Age: 50 years     Sex: Male     Is Non-Hispanic African American: Yes     Diabetic: No     Tobacco smoker: Yes     Systolic Blood  Pressure: 113 mmHg     Is BP treated: Yes     HDL Cholesterol: 65 mg/dL     Total Cholesterol: 164 mg/dL  ASCVD risk factors include- [place]   ASSESSMENT & PLAN: Jesusmanuel was seen today for hypertension and medication refill.  Diagnoses and all orders for this visit:  Medication refill -     hydrochlorothiazide (HYDRODIURIL) 25 MG tablet; Take 1 tablet (25 mg total) by mouth daily. May take and additional tablet for increase edema in feet. -     amLODipine (NORVASC) 10 MG tablet; TAKE 1 TABLET(10 MG) BY MOUTH DAILY   Primary hypertension  blood pressure Well controlled < 140/90  continue  reducing dietary sodium, increased exercise, weight reduction and adequate sleep.Marland Kitchen Also counseled patient about the importance of medication adherence. If you participate in smoking, it is important to stop using tobacco as this will increase the risks associated with uncontrolled blood pressure.   Goal BP:  For patients younger than 60: Goal BP < 130/80. For patients 60 and older: Goal BP < 140/90. For patients with diabetes: Goal BP < 130/80. Your most recent BP: 113/80  Minimize salt intake. Minimize alcohol intake    This note has been created with Surveyor, quantity. Any transcriptional errors are unintentional.   Kerin Perna, NP 07/24/2022, 3:10 PM

## 2022-10-12 ENCOUNTER — Ambulatory Visit (INDEPENDENT_AMBULATORY_CARE_PROVIDER_SITE_OTHER): Payer: Medicare Other | Admitting: Primary Care

## 2022-11-05 ENCOUNTER — Ambulatory Visit (INDEPENDENT_AMBULATORY_CARE_PROVIDER_SITE_OTHER): Payer: Medicare Other | Admitting: Primary Care

## 2023-01-07 ENCOUNTER — Encounter: Payer: Self-pay | Admitting: Gastroenterology

## 2023-01-16 IMAGING — PT NM PET TUM IMG SKULL BASE T - THIGH
1 of 6 series · 1 of 25 positions shown · non-contrast
Comparison: 11/18/2018 abdominopelvic CT.

CLINICAL DATA: Status post radiation therapy for prostate cancer.
Rising PSA. Current PSA 9.5.

EXAM:
NUCLEAR MEDICINE PET SKULL BASE TO THIGH
TECHNIQUE: 8.4 mCi F18 Piflufolastat (Pylarify) was injected intravenously.
Full-ring PET imaging was performed from the skull base to thigh
after the radiotracer. CT data was obtained and used for attenuation
correction and anatomic localization.

[Series 3: pet sk_thigh ac · axial · 5.0mm · 4.07mm/px · 1 of 243 slices shown]
[im 146/243]
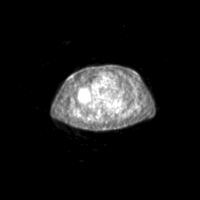

[1 of 25 positions shown; findings below may reference images not displayed]

FINDINGS: NECK

No radiotracer activity in neck lymph nodes.

Incidental CT finding: No cervical adenopathy.

CHEST

No pulmonary parenchymal or thoracic nodal hypermetabolism.

Incidental CT finding: Aortic and minimal coronary artery
calcification. Right hemidiaphragm elevation. Centrilobular and
paraseptal emphysema, mild.

ABDOMEN/PELVIS

Prostate: Radiation seeds within the prostate. Foci of tracer
avidity including within the right lateral mid and apical gland.
These measure a S.U.V. max of 7.2 and 6.7 respectively.

Lymph nodes: No abnormal radiotracer accumulation within pelvic or
abdominal nodes.

Liver: No evidence of liver metastasis

Incidental CT finding: Normal adrenal glands. No renal calculi or
hydronephrosis. Abdominal aortic atherosclerosis. No abdominopelvic
adenopathy. Degraded evaluation of the pelvis, secondary to beam
hardening artifact from right hip arthroplasty.

SKELETON

No abnormal marrow activity.  No focal osseous lesion.
IMPRESSION: 1. Right-sided prostatic tracer uptake, most consistent with
residual/recurrent disease.
2. No evidence of nodal or osseous uptake to suggest distant
metastasis.
3. Incidental findings, including: Aortic atherosclerosis
([id number]), coronary artery atherosclerosis and emphysema
([id number]).

## 2023-02-25 ENCOUNTER — Ambulatory Visit (INDEPENDENT_AMBULATORY_CARE_PROVIDER_SITE_OTHER): Payer: 59 | Admitting: Primary Care

## 2023-02-25 ENCOUNTER — Encounter (INDEPENDENT_AMBULATORY_CARE_PROVIDER_SITE_OTHER): Payer: Self-pay | Admitting: Primary Care

## 2023-02-25 VITALS — BP 93/64 | HR 78 | Resp 16 | Ht 68.0 in | Wt 215.6 lb

## 2023-02-25 DIAGNOSIS — Z1211 Encounter for screening for malignant neoplasm of colon: Secondary | ICD-10-CM

## 2023-02-25 DIAGNOSIS — M545 Low back pain, unspecified: Secondary | ICD-10-CM | POA: Diagnosis not present

## 2023-02-25 DIAGNOSIS — R197 Diarrhea, unspecified: Secondary | ICD-10-CM | POA: Diagnosis not present

## 2023-02-25 DIAGNOSIS — I10 Essential (primary) hypertension: Secondary | ICD-10-CM | POA: Diagnosis not present

## 2023-02-25 MED ORDER — HYDROCHLOROTHIAZIDE 12.5 MG PO TABS: 25.0000 mg | ORAL_TABLET | Freq: Every day | ORAL | 1 refills | Status: DC

## 2023-02-25 MED ORDER — AMLODIPINE BESYLATE 5 MG PO TABS: 10.0000 mg | ORAL_TABLET | Freq: Every day | ORAL | 1 refills | Status: DC

## 2023-02-25 MED ORDER — METHOCARBAMOL 500 MG PO TABS: 500.0000 mg | ORAL_TABLET | Freq: Three times a day (TID) | ORAL | 1 refills | Status: DC | PRN

## 2023-02-25 NOTE — Addendum Note (Signed)
Addended by: Juluis Mire on: 02/25/2023 02:15 PM   Modules accepted: Orders

## 2023-02-25 NOTE — Patient Instructions (Signed)
Acute Back Pain, Adult Acute back pain is sudden and usually short-lived. It is often caused by an injury to the muscles and tissues in the back. The injury may result from: A muscle, tendon, or ligament getting overstretched or torn. Ligaments are tissues that connect bones to each other. Lifting something improperly can cause a back strain. Wear and tear (degeneration) of the spinal disks. Spinal disks are circular tissue that provide cushioning between the bones of the spine (vertebrae). Twisting motions, such as while playing sports or doing yard work. A hit to the back. Arthritis. You may have a physical exam, lab tests, and imaging tests to find the cause of your pain. Acute back pain usually goes away with rest and home care. Follow these instructions at home: Managing pain, stiffness, and swelling Take over-the-counter and prescription medicines only as told by your health care provider. Treatment may include medicines for pain and inflammation that are taken by mouth or applied to the skin, or muscle relaxants. Your health care provider may recommend applying ice during the first 24-48 hours after your pain starts. To do this: Put ice in a plastic bag. Place a towel between your skin and the bag. Leave the ice on for 20 minutes, 2-3 times a day. Remove the ice if your skin turns bright red. This is very important. If you cannot feel pain, heat, or cold, you have a greater risk of damage to the area. If directed, apply heat to the affected area as often as told by your health care provider. Use the heat source that your health care provider recommends, such as a moist heat pack or a heating pad. Place a towel between your skin and the heat source. Leave the heat on for 20-30 minutes. Remove the heat if your skin turns bright red. This is especially important if you are unable to feel pain, heat, or cold. You have a greater risk of getting burned. Activity  Do not stay in bed. Staying in  bed for more than 1-2 days can delay your recovery. Sit up and stand up straight. Avoid leaning forward when you sit or hunching over when you stand. If you work at a desk, sit close to it so you do not need to lean over. Keep your chin tucked in. Keep your neck drawn back, and keep your elbows bent at a 90-degree angle (right angle). Sit high and close to the steering wheel when you drive. Add lower back (lumbar) support to your car seat, if needed. Take short walks on even surfaces as soon as you are able. Try to increase the length of time you walk each day. Do not sit, drive, or stand in one place for more than 30 minutes at a time. Sitting or standing for long periods of time can put stress on your back. Do not drive or use heavy machinery while taking prescription pain medicine. Use proper lifting techniques. When you bend and lift, use positions that put less stress on your back: Bend your knees. Keep the load close to your body. Avoid twisting. Exercise regularly as told by your health care provider. Exercising helps your back heal faster and helps prevent back injuries by keeping muscles strong and flexible. Work with a physical therapist to make a safe exercise program, as recommended by your health care provider. Do any exercises as told by your physical therapist. Lifestyle Maintain a healthy weight. Extra weight puts stress on your back and makes it difficult to have good   posture. Avoid activities or situations that make you feel anxious or stressed. Stress and anxiety increase muscle tension and can make back pain worse. Learn ways to manage anxiety and stress, such as through exercise. General instructions Sleep on a firm mattress in a comfortable position. Try lying on your side with your knees slightly bent. If you lie on your back, put a pillow under your knees. Keep your head and neck in a straight line with your spine (neutral position) when using electronic equipment like  smartphones or pads. To do this: Raise your smartphone or pad to look at it instead of bending your head or neck to look down. Put the smartphone or pad at the level of your face while looking at the screen. Follow your treatment plan as told by your health care provider. This may include: Cognitive or behavioral therapy. Acupuncture or massage therapy. Meditation or yoga. Contact a health care provider if: You have pain that is not relieved with rest or medicine. You have increasing pain going down into your legs or buttocks. Your pain does not improve after 2 weeks. You have pain at night. You lose weight without trying. You have a fever or chills. You develop nausea or vomiting. You develop abdominal pain. Get help right away if: You develop new bowel or bladder control problems. You have unusual weakness or numbness in your arms or legs. You feel faint. These symptoms may represent a serious problem that is an emergency. Do not wait to see if the symptoms will go away. Get medical help right away. Call your local emergency services (911 in the [place] Do not drive yourself to the hospital. Summary Acute back pain is sudden and usually short-lived. Use proper lifting techniques. When you bend and lift, use positions that put less stress on your back. Take over-the-counter and prescription medicines only as told by your health care provider, and apply heat or ice as told. This information is not intended to replace advice given to you by your health care provider. Make sure you discuss any questions you have with your health care provider. Document Revised: 03/03/2021 Document Reviewed: 03/03/2021 Elsevier Patient Education  2023 [employer]vier Inc.  

## 2023-02-25 NOTE — Progress Notes (Addendum)
[place], is a 72 y.o. male  [id number]  [id number]  DOB - Apr 29, 1951  Chief Complaint  Patient presents with   Hypertension       Subjective:   Jared Rhodes is a 72 y.o. male here today for a acute visit. 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 Reports symptoms of diarrhea every 10- 15 comes out like water. Denies abdominal pain, fever or chills. Unsure if other employees are experiencing the same problem. Marland Kitchen  BACK PAIN Location: lumbar/sacral L5-S3  bilateral side pain . Quality: aching, throbbing, and uncomfortable Onset: sudden Worse with: Aggravating factors: certain movements and prolonged walking/standing. Alleviating factors: rest. Progressive LE weakness or saddle anesthesia: none. Extremity sensation changes or weakness: none. Ambulatory without difficulty. Normal bowel/bladder habits: yes; without urinary retention. Normal PO intake without n/v. No associated abdominal pain/n/v. Self treatment: has OTC analgesics, with minimal relief Tylenol      Better with: rest  Radiation: none Trauma: no Best sitting/standing/leaning forward: yes Night pain: yes  No relief with bedrest: no  Patient has No headache, No chest pain, No abdominal pain - No Nausea, No new weakness tingling or numbness, No Cough - shortness of breath  No problems updated.  Allergies  Allergen Reactions   Penicillins Rash    Has patient had a PCN reaction causing immediate rash, facial/tongue/throat swelling, SOB or lightheadedness with hypotension: UnknowN Has patient had a PCN reaction causing severe rash involving mucus membranes or skin necrosis: UnknowN Has patient had a PCN reaction that required hospitalization: Unknown Has patient had a PCN reaction occurring within the last 10 years: /Unknown If all of the above answers are "NO", then may proceed with Cephalosporin use.     Past Medical History:  Diagnosis Date   Hypertension    Prostate cancer ([place]) 2018    radation 40 treatments     Current Outpatient Medications on File Prior to Visit  Medication Sig Dispense Refill   acetaminophen (TYLENOL) 325 MG tablet Take 650 mg by mouth every 6 (six) hours as needed for moderate pain.     docusate sodium (COLACE) 100 MG capsule Take 1 capsule (100 mg total) by mouth 2 (two) times daily.     No current facility-administered medications on file prior to visit.    Objective:   Vitals:   02/25/23 1340 02/25/23 1343  BP: 93/61 93/64  Pulse: 78   Resp: 16   SpO2: 96%   Weight: 215 lb 9.6 oz (97.8 kg)   Height: '5\' 8"'$  (1.727 m)     Comprehensive ROS Pertinent positive and negative noted in HPI   Exam General appearance : Awake, alert, not in any distress. Speech Clear. Not toxic looking HEENT: Atraumatic and Normocephalic, pupils equally reactive to light and accomodation Neck: Supple, no JVD. No cervical lymphadenopathy.  Chest: Good air entry bilaterally, no added sounds  CVS: S1 S2 regular, no murmurs.  Abdomen: Bowel sounds present, Non tender and not distended with no gaurding, rigidity or rebound. Extremities: B/L Lower Ext shows no edema, both legs are warm to touch Neurology: Awake alert, and oriented X 3,  Non focal Skin: No Rash  Data Review No results found for: "HGBA1C"  Assessment & Plan  Jared Rhodes was seen today for hypertension.  Diagnoses and all orders for this visit:  Acute bilateral low back pain without sciatica Asked patient to pick up something uses his back improper lifting and bending technics  See HPI   Diarrhea, unspecified type Not  eating well , when he eats he has diarrhea - let run its course stay hydrated and eat a Brat diet  cmp Primary hypertension   Hypotensive changed amlodipine '5mg'$  rtn 4 weeks  Patient have been counseled extensively about nutrition and exercise. Other issues discussed during this visit include: low cholesterol diet, weight control and daily exercise, foot care, annual eye  examinations at Ophthalmology, importance of adherence with medications and regular follow-up. We also discussed long term complications of uncontrolled diabetes and hypertension.   No follow-ups on file.  The patient was given clear instructions to go to ER or return to medical center if symptoms don't improve, worsen or new problems develop. The patient verbalized understanding. The patient was told to call to get lab results if they haven't heard anything in the next week.   This note has been created with Surveyor, quantity. Any transcriptional errors are unintentional.   Kerin Perna, NP 02/25/2023, 2:10 PM

## 2023-02-26 LAB — CMP14+EGFR
ALT: 14 IU/L (ref 0–44)
AST: 20 IU/L (ref 0–40)
Albumin/Globulin Ratio: 1.1 — ABNORMAL LOW (ref 1.2–2.2)
Albumin: 4 g/dL (ref 3.8–4.8)
Alkaline Phosphatase: 54 IU/L (ref 44–121)
BUN/Creatinine Ratio: 16 (ref 10–24)
BUN: 17 mg/dL (ref 8–27)
Bilirubin Total: <0.2 mg/dL (ref 0.0–1.2)
CO2: 20 mmol/L (ref 20–29)
Calcium: 8.5 mg/dL — ABNORMAL LOW (ref 8.6–10.2)
Chloride: 103 mmol/L (ref 96–106)
Creatinine, Ser: 1.09 mg/dL (ref 0.76–1.27)
Globulin, Total: 3.5 g/dL (ref 1.5–4.5)
Glucose: 76 mg/dL (ref 70–99)
Potassium: 4 mmol/L (ref 3.5–5.2)
Sodium: 138 mmol/L (ref 134–144)
Total Protein: 7.5 g/dL (ref 6.0–8.5)
eGFR: 73 mL/min/{1.73_m2} (ref >59)

## 2023-02-28 ENCOUNTER — Ambulatory Visit: Payer: 59 | Admitting: Orthopaedic Surgery

## 2023-03-05 ENCOUNTER — Encounter (INDEPENDENT_AMBULATORY_CARE_PROVIDER_SITE_OTHER): Payer: Self-pay

## 2023-03-05 ENCOUNTER — Ambulatory Visit (INDEPENDENT_AMBULATORY_CARE_PROVIDER_SITE_OTHER): Payer: 59 | Admitting: Orthopaedic Surgery

## 2023-03-05 ENCOUNTER — Ambulatory Visit (INDEPENDENT_AMBULATORY_CARE_PROVIDER_SITE_OTHER): Payer: 59

## 2023-03-05 DIAGNOSIS — M25572 Pain in left ankle and joints of left foot: Secondary | ICD-10-CM | POA: Diagnosis not present

## 2023-03-05 MED ORDER — DICLOFENAC SODIUM 75 MG PO TBEC: 75.0000 mg | DELAYED_RELEASE_TABLET | Freq: Two times a day (BID) | ORAL | 2 refills | Status: DC | PRN

## 2023-03-05 MED ORDER — TRAMADOL HCL 50 MG PO TABS: 50.0000 mg | ORAL_TABLET | Freq: Two times a day (BID) | ORAL | 2 refills | Status: DC | PRN

## 2023-03-05 NOTE — Progress Notes (Signed)
Office Visit Note   Patient: Jared Rhodes.           Date of Birth: 07-15-1951           MRN: [id number] Visit Date: 03/05/2023              Requested by: Kerin Perna, NP [place],  [place] [address] PCP: Kerin Perna, NP   Assessment & Plan: Visit Diagnoses:  1. Pain in left ankle and joints of left foot     Plan: 72 year old gentleman with left posterior tibial tendon insufficiency and flatfoot deformity.  Disease process explained.  Patient would like to hold off on cam boot and would rather just try arch supports.  I have recommended [employer] and Superfeet arch supports.  He will follow-up with [place] as needed call with concerns or questions.  Follow-Up Instructions: Return if symptoms worsen or fail to improve.   Orders:  Orders Placed This Encounter  Procedures   XR Ankle Complete Left   Meds ordered this encounter  Medications   diclofenac (VOLTAREN) 75 MG EC tablet    Sig: Take 1 tablet (75 mg total) by mouth 2 (two) times daily as needed.    Dispense:  60 tablet    Refill:  2   traMADol (ULTRAM) 50 MG tablet    Sig: Take 1 tablet (50 mg total) by mouth every 12 (twelve) hours as needed.    Dispense:  30 tablet    Refill:  2      Procedures: No procedures performed   Clinical Data: No additional findings.   Subjective: Chief Complaint  Patient presents with   Left Ankle - Pain    HPI patient is a pleasant 72 year old gentleman who comes in today with left ankle pain for the past 2 to 3 weeks.  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  The pain he has is to the medial aspect.  Symptoms are worse when he first gets up in the morning as well as throughout the day when he is walking.  He has been taking Tylenol without any relief.  Review of Systems  Constitutional: Negative.   HENT: Negative.     Eyes: Negative.   Respiratory: Negative.    Cardiovascular: Negative.   Gastrointestinal: Negative.   Endocrine: Negative.   Genitourinary: Negative.   Skin: Negative.   Allergic/Immunologic: Negative.   Neurological: Negative.   Hematological: Negative.   Psychiatric/Behavioral: Negative.    All other systems reviewed and are negative.    Objective: Vital Signs: There were no vitals taken for this visit.  Physical Exam Vitals and nursing note reviewed.  Constitutional:      Appearance: He is well-developed.  Pulmonary:     Effort: Pulmonary effort is normal.  Abdominal:     Palpations: Abdomen is soft.  Skin:    General: Skin is warm.  Neurological:     Mental Status: He is alert and oriented to person, place, and time.  Psychiatric:        Behavior: Behavior normal.        Thought Content: Thought content normal.        Judgment: Judgment normal.     Ortho Exam left ankle exam shows pes planus.  He does have moderate tenderness along the posterior tibial tendon.  Mild tenderness along the peroneal tendon.  He has increased pain with resisted inversion of the ankle.  He is neurovascularly intact distally.  Specialty Comments:  No specialty comments available.  Imaging: XR Ankle Complete Left  Result Date: 03/05/2023 Previous ORIF left fibula fracture and syndesmosis fixation without hardware complication    PMFS History: Patient Active Problem List   Diagnosis Date Noted   Prostate cancer ([place]) 02/01/2017   Osteoarthritis of right hip 03/02/2016   Hip joint replacement status 03/02/2016   Alcohol intoxication ([place]) 07/17/2014   Type III open fracture dislocation of left ankle joint 07/17/2014   HTN (hypertension) 04/17/2014   Severe obesity (BMI >= 40) ([place]) 04/17/2014   Past Medical History:  Diagnosis Date   Hypertension    Prostate cancer ([place]) 2018   radation 40 treatments     Family History  Problem Relation Age of Onset   Alcohol abuse  Mother    Heart disease Sister    Hyperlipidemia Brother    Colon cancer Neg Hx    Breast cancer Neg Hx    Prostate cancer Neg Hx    Pancreatic cancer Neg Hx     Past Surgical History:  Procedure Laterality Date   I & D EXTREMITY Left 07/17/2014   Procedure: IRRIGATION AND DEBRIDEMENT EXTREMITY;  Surgeon: Marianna Payment, MD;  Location: [facility];  Service: Orthopedics;  Laterality: Left;   I & D EXTREMITY Left 07/19/2014   Procedure: IRRIGATION AND DEBRIDEMENT EXTREMITY;  Surgeon: Marianna Payment, MD;  Location: [facility];  Service: Orthopedics;  Laterality: Left;   LYMPHADENECTOMY Bilateral 09/25/2021   Procedure: LYMPHADENECTOMY, PELVIC;  Surgeon: Raynelle Bring, MD;  Location: [facility];  Service: Urology;  Laterality: Bilateral;   ORIF ANKLE FRACTURE Left 07/19/2014   Procedure: OPEN REDUCTION INTERNAL FIXATION (ORIF) ANKLE FRACTURE;  Surgeon: Marianna Payment, MD;  Location: [facility];  Service: Orthopedics;  Laterality: Left;   PROSTATE BIOPSY     ROBOT ASSISTED LAPAROSCOPIC RADICAL PROSTATECTOMY N/A 09/25/2021   Procedure: XI ROBOTIC ASSISTED LAPAROSCOPIC RADICAL PROSTATECTOMY LEVEL 3;  Surgeon: Raynelle Bring, MD;  Location: [facility];  Service: Urology;  Laterality: N/A;   TONSILLECTOMY     TOTAL HIP ARTHROPLASTY Right 03/02/2016   Procedure: RIGHT TOTAL HIP ARTHROPLASTY ANTERIOR APPROACH;  Surgeon: Leandrew Koyanagi, MD;  Location: [facility];  Service: Orthopedics;  Laterality: Right;   Social History   Occupational History   Occupation: CAB DRIVER    Employer: [employer]   Occupation: truck Geophysicist/field seismologist  Tobacco Use   Smoking status: Some Days    Packs/day: 1.00    Years: 30.00    Total pack years: 30.00    Types: Cigarettes   Smokeless tobacco: Never  Vaping Use   Vaping Use: Never used  Substance and Sexual Activity   Alcohol use: Yes    Alcohol/week: 12.0 standard drinks of alcohol    Types: 12 Cans of beer per week    Comment: 12 beers per week per pt.    Drug use: No   Sexual  activity: Yes    Partners: Female

## 2023-03-09 ENCOUNTER — Other Ambulatory Visit (INDEPENDENT_AMBULATORY_CARE_PROVIDER_SITE_OTHER): Payer: Self-pay | Admitting: Primary Care

## 2023-03-09 DIAGNOSIS — Z760 Encounter for issue of repeat prescription: Secondary | ICD-10-CM

## 2023-03-09 DIAGNOSIS — I10 Essential (primary) hypertension: Secondary | ICD-10-CM

## 2023-03-14 ENCOUNTER — Telehealth (INDEPENDENT_AMBULATORY_CARE_PROVIDER_SITE_OTHER): Payer: Self-pay

## 2023-03-14 NOTE — Telephone Encounter (Signed)
Pt given lab results per notes of Sharyn Lull, NP from 02/28/23 on 03/14/23. Pt verbalized understanding.

## 2023-03-15 ENCOUNTER — Ambulatory Visit (HOSPITAL_COMMUNITY)
Admission: EM | Admit: 2023-03-15 | Discharge: 2023-03-15 | Disposition: A | Discharge status: Home / Self Care | Payer: 59 | Attending: Emergency Medicine | Admitting: Emergency Medicine

## 2023-03-15 ENCOUNTER — Encounter (HOSPITAL_COMMUNITY): Payer: Self-pay

## 2023-03-15 DIAGNOSIS — K047 Periapical abscess without sinus: Secondary | ICD-10-CM | POA: Diagnosis not present

## 2023-03-15 MED ORDER — NAPROXEN 500 MG PO TABS: 500.0000 mg | ORAL_TABLET | Freq: Two times a day (BID) | ORAL | 0 refills | Status: AC

## 2023-03-15 MED ORDER — DOXYCYCLINE HYCLATE 100 MG PO CAPS: 100.0000 mg | ORAL_CAPSULE | Freq: Two times a day (BID) | ORAL | 0 refills | Status: DC

## 2023-03-15 NOTE — Discharge Instructions (Addendum)
You have a dental abscess of your left lower jaw.  It is actively draining, please change out your gauze once saturated with pus.  Please take all antibiotics as prescribed and until finished even if you start feeling better.  You can take them with food to help prevent gastrointestinal upset.  Please take the anti-inflammatory naproxen twice daily as well to help with pain and inflammation.  Please use a warm compress on the area to encourage drainage.  You can do saline gargles 2-3 times daily as well.  Please follow-up with your dentist on Monday for further evaluation.  Please seek immediate care if you develop trouble swallowing, are unable to eat or drink, develop shortness of breath, fever, or worsening of condition.

## 2023-03-15 NOTE — ED Provider Notes (Signed)
[facility]    CSN: [id number] Arrival date & time: 03/15/23  1158      History   Chief Complaint Chief Complaint  Patient presents with   Facial Swelling    HPI Jared Peot. is a 72 y.o. male.   Reports left sided jaw / facial swelling for the past 5 days. Has not shaved recently.  Does need teeth removed in his left lower jaw.  Pain currently 10/10.  Denies fevers.  The history is provided by the patient.    Past Medical History:  Diagnosis Date   Hypertension    Prostate cancer ([place]) 2018   radation 40 treatments     Patient Active Problem List   Diagnosis Date Noted   Prostate cancer ([place]) 02/01/2017   Osteoarthritis of right hip 03/02/2016   Hip joint replacement status 03/02/2016   Alcohol intoxication ([place]) 07/17/2014   Type III open fracture dislocation of left ankle joint 07/17/2014   HTN (hypertension) 04/17/2014   Severe obesity (BMI >= 40) ([place]) 04/17/2014    Past Surgical History:  Procedure Laterality Date   I & D EXTREMITY Left 07/17/2014   Procedure: IRRIGATION AND DEBRIDEMENT EXTREMITY;  Surgeon: Marianna Payment, MD;  Location: [facility];  Service: Orthopedics;  Laterality: Left;   I & D EXTREMITY Left 07/19/2014   Procedure: IRRIGATION AND DEBRIDEMENT EXTREMITY;  Surgeon: Marianna Payment, MD;  Location: [facility];  Service: Orthopedics;  Laterality: Left;   LYMPHADENECTOMY Bilateral 09/25/2021   Procedure: LYMPHADENECTOMY, PELVIC;  Surgeon: Raynelle Bring, MD;  Location: [facility];  Service: Urology;  Laterality: Bilateral;   ORIF ANKLE FRACTURE Left 07/19/2014   Procedure: OPEN REDUCTION INTERNAL FIXATION (ORIF) ANKLE FRACTURE;  Surgeon: Marianna Payment, MD;  Location: [facility];  Service: Orthopedics;  Laterality: Left;   PROSTATE BIOPSY     ROBOT ASSISTED LAPAROSCOPIC RADICAL PROSTATECTOMY N/A 09/25/2021   Procedure: XI ROBOTIC ASSISTED LAPAROSCOPIC RADICAL PROSTATECTOMY LEVEL 3;  Surgeon: Raynelle Bring, MD;  Location: [facility];   Service: Urology;  Laterality: N/A;   TONSILLECTOMY     TOTAL HIP ARTHROPLASTY Right 03/02/2016   Procedure: RIGHT TOTAL HIP ARTHROPLASTY ANTERIOR APPROACH;  Surgeon: Leandrew Koyanagi, MD;  Location: [facility];  Service: Orthopedics;  Laterality: Right;       Home Medications    Prior to Admission medications   Medication Sig Start Date End Date Taking? Authorizing Provider  acetaminophen (TYLENOL) 325 MG tablet Take 650 mg by mouth every 6 (six) hours as needed for moderate pain.   Yes [provider]  amLODipine (NORVASC) 5 MG tablet Take 2 tablets (10 mg total) by mouth daily. 02/25/23  Yes Kerin Perna, NP  diclofenac (VOLTAREN) 75 MG EC tablet Take 1 tablet (75 mg total) by mouth 2 (two) times daily as needed. 03/05/23  Yes Aundra Dubin, PA-C  docusate sodium (COLACE) 100 MG capsule Take 1 capsule (100 mg total) by mouth 2 (two) times daily. 09/25/21  Yes Dancy, Estill Bamberg, PA-C  doxycycline (VIBRAMYCIN) 100 MG capsule Take 1 capsule (100 mg total) by mouth 2 (two) times daily. 03/15/23  Yes Louretta Shorten, [place], FNP  hydrochlorothiazide (HYDRODIURIL) 12.5 MG tablet Take 2 tablets (25 mg total) by mouth daily. 02/25/23  Yes Kerin Perna, NP  methocarbamol (ROBAXIN) 500 MG tablet Take 1 tablet (500 mg total) by mouth every 8 (eight) hours as needed for muscle spasms. 02/25/23  Yes Kerin Perna, NP  naproxen (NAPROSYN) 500 MG tablet Take 1  tablet (500 mg total) by mouth 2 (two) times daily for 7 days. 03/15/23 03/22/23 Yes Louretta Shorten, [place], FNP  traMADol (ULTRAM) 50 MG tablet Take 1 tablet (50 mg total) by mouth every 12 (twelve) hours as needed. 03/05/23  Yes Aundra Dubin, PA-C    Family History Family History  Problem Relation Age of Onset   Alcohol abuse Mother    Heart disease Sister    Hyperlipidemia Brother    Colon cancer Neg Hx    Breast cancer Neg Hx    Prostate cancer Neg Hx    Pancreatic cancer Neg Hx     Social History Social History   Tobacco Use    Smoking status: Some Days    Packs/day: 1.00    Years: 30.00    Additional pack years: 0.00    Total pack years: 30.00    Types: Cigarettes   Smokeless tobacco: Never  Vaping Use   Vaping Use: Never used  Substance Use Topics   Alcohol use: Yes    Alcohol/week: 12.0 standard drinks of alcohol    Types: 12 Cans of beer per week    Comment: 12 beers per week per pt.    Drug use: No     Allergies   Penicillins   Review of Systems Review of Systems  Constitutional:  Negative for fatigue and fever.  HENT:  Positive for dental problem and facial swelling.   Eyes:  Negative for discharge.  Respiratory:  Negative for cough and shortness of breath.   Cardiovascular:  Negative for chest pain.  Gastrointestinal:  Negative for abdominal pain.     Physical Exam Triage Vital Signs ED Triage Vitals [03/15/23 1237]  Enc Vitals Group     BP 110/72     Pulse Rate 87     Resp 18     Temp 98.4 F (36.9 C)     Temp Source Oral     SpO2 98 %     Weight      Height      Head Circumference      Peak Flow      Pain Score      Pain Loc      Pain Edu?      Excl. in [place]?    No data found.  Updated Vital Signs BP 110/72 (BP Location: Left Arm)   Pulse 87   Temp 98.4 F (36.9 C) (Oral)   Resp 18   SpO2 98%   Visual Acuity Right Eye Distance:   Left Eye Distance:   Bilateral Distance:    Right Eye Near:   Left Eye Near:    Bilateral Near:     Physical Exam Vitals and nursing note reviewed.  Constitutional:      Appearance: Normal appearance.  HENT:     Head: Normocephalic and atraumatic.     Jaw: Swelling present.      Nose: Nose normal.     Mouth/Throat:     Mouth: Mucous membranes are moist.     Dentition: Abnormal dentition. Dental caries and dental abscesses present.     Tongue: No lesions. Tongue does not deviate from midline.     Palate: No mass and lesions.     Pharynx: Uvula midline. No pharyngeal swelling, oropharyngeal exudate, posterior  oropharyngeal erythema or uvula swelling.     Tonsils: No tonsillar exudate or tonsillar abscesses.      Comments: Purulent bloody drainage actively being expelled from left lower gums /  dental area.  Cardiovascular:     Rate and Rhythm: Normal rate and regular rhythm.  Pulmonary:     Effort: Pulmonary effort is normal. No respiratory distress.  Lymphadenopathy:     Cervical: Cervical adenopathy present.  Neurological:     Mental Status: He is alert.  Psychiatric:        Behavior: Behavior is cooperative.      UC Treatments / Results  Labs (all labs ordered are listed, but only abnormal results are displayed) Labs Reviewed - No data to display  EKG   Radiology No results found.  Procedures Procedures (including critical care time)  Medications Ordered in UC Medications - No data to display  Initial Impression / Assessment and Plan / UC Course  I have reviewed the triage vital signs and the nursing notes.  Pertinent labs & imaging results that were available during my care of the patient were reviewed by me and considered in my medical decision making (see chart for details).  Vitals in triage reviewed, patient is hemodynamically stable.  Afebrile without tachycardia.  Left lower jaw swelling, on oral examination a puddle of bloody purulent fluid was noted to the left lower outer molars.  Able to express more drainage with pressure.  Will cover with doxycycline due to penicillin allergy.  Advised to follow-up with dentist on Monday.  Discussed warm compresses, saline rinses and anti-inflammatories.  Patient in no acute distress, uvula midline, able to speak freely, low concern for airway involvement at this point.  Strict return and follow-up precautions given, patient verbalized understanding.  No questions at this time.    Final Clinical Impressions(s) / UC Diagnoses   Final diagnoses:  Dental abscess     Discharge Instructions      You have a dental abscess of  your left lower jaw.  It is actively draining, please change out your gauze once saturated with pus.  Please take all antibiotics as prescribed and until finished even if you start feeling better.  You can take them with food to help prevent gastrointestinal upset.  Please take the anti-inflammatory naproxen twice daily as well to help with pain and inflammation.  Please use a warm compress on the area to encourage drainage.  You can do saline gargles 2-3 times daily as well.  Please follow-up with your dentist on Monday for further evaluation.  Please seek immediate care if you develop trouble swallowing, are unable to eat or drink, develop shortness of breath, fever, or worsening of condition.      ED Prescriptions     Medication Sig Dispense Auth. Provider   doxycycline (VIBRAMYCIN) 100 MG capsule Take 1 capsule (100 mg total) by mouth 2 (two) times daily. 20 capsule Louretta Shorten, [place], [place]   naproxen (NAPROSYN) 500 MG tablet Take 1 tablet (500 mg total) by mouth 2 (two) times daily for 7 days. 14 tablet Nasreen Goedecke, [place], [place]      PDMP not reviewed this encounter.   Airanna Partin, [place], [place] 03/15/23 [phone]

## 2023-03-15 NOTE — ED Triage Notes (Signed)
Pt states left side facial swelling and pain x 1 week.

## 2023-03-25 ENCOUNTER — Ambulatory Visit (INDEPENDENT_AMBULATORY_CARE_PROVIDER_SITE_OTHER): Payer: 59 | Admitting: Primary Care

## 2023-03-29 ENCOUNTER — Other Ambulatory Visit (INDEPENDENT_AMBULATORY_CARE_PROVIDER_SITE_OTHER): Payer: Self-pay | Admitting: Primary Care

## 2023-04-02 ENCOUNTER — Ambulatory Visit (INDEPENDENT_AMBULATORY_CARE_PROVIDER_SITE_OTHER): Payer: Self-pay

## 2023-04-02 NOTE — Telephone Encounter (Signed)
Attempted to contact patient and VM is not set up yet, patient should be taking 10Mg  of the medication.

## 2023-04-02 NOTE — Telephone Encounter (Signed)
  Chief Complaint: Medication question.  Symptoms:  Frequency:  Pertinent Negatives: Patient denies  Disposition: [] ED /[] Urgent Care (no appt availability in office) / [] Appointment(In office/virtual)/ []  [facility]/ [] Home Care/ [] Refused Recommended Disposition /[] [facility]/ [x]  Follow-up with PCP Additional Notes: Please verify dosage of Amlodipine for this pt. Pt states he has both 5 mg and 10 mg , and thinks he is supposed to take both.  Please advise.   Summary: Pt requesting refill of amLODipine but it shows Rx was sent to pharmacy on 03/29/23.   Pt requesting refill of Rx for amLODipine (NORVASC) 5 MG tablet. Informed pt that the Rx shows it was sent to his pharmacy on 03/29/23 so he can contact his pharmacy. Pt stated he knows about that refill but he is out of the medication. Pt requests call back to discuss. Cb# [phone]       Reason for Disposition  [1] Caller has URGENT medicine question about med that PCP or specialist prescribed AND [2] triager unable to answer question  Answer Assessment - Initial Assessment Questions 1. NAME of MEDICINE: "What medicine(s) are you calling about?"     HTN medications 2. QUESTION: "What is your question?" (e.g., double dose of medicine, side effect)     What should pt be taking 3. PRESCRIBER: "Who prescribed the medicine?" Reason: if prescribed by specialist, call should be referred to that group.     Gwinda Passe  Protocols used: Medication Question Call-A-AH

## 2023-04-08 ENCOUNTER — Ambulatory Visit (INDEPENDENT_AMBULATORY_CARE_PROVIDER_SITE_OTHER): Payer: 59 | Admitting: Primary Care

## 2023-05-09 ENCOUNTER — Encounter (INDEPENDENT_AMBULATORY_CARE_PROVIDER_SITE_OTHER): Payer: Self-pay

## 2023-06-01 ENCOUNTER — Other Ambulatory Visit (INDEPENDENT_AMBULATORY_CARE_PROVIDER_SITE_OTHER): Payer: Self-pay | Admitting: Primary Care

## 2023-06-21 ENCOUNTER — Other Ambulatory Visit (INDEPENDENT_AMBULATORY_CARE_PROVIDER_SITE_OTHER): Payer: Self-pay | Admitting: Primary Care

## 2023-06-21 NOTE — Telephone Encounter (Signed)
Medication Refill - Medication: amLODipine (NORVASC) 5 MG tablet   Has the patient contacted their pharmacy? yes (Agent: If no, request that the patient contact the pharmacy for the refill. If patient does not wish to contact the pharmacy document the reason why and proceed with request.) (Agent: If yes, when and what did the pharmacy advise?)contact pcp  Preferred Pharmacy (with phone number or street name):  [id number] [place] Otto, [place] - [address] AT [facility] Phone: [phone]  Fax: [phone]     Has the patient been seen for an appointment in the last year OR does the patient have an upcoming appointment? yes  Agent: Please be advised that RX refills may take up to 3 business days. We ask that you follow-up with your pharmacy.

## 2023-06-24 ENCOUNTER — Other Ambulatory Visit (INDEPENDENT_AMBULATORY_CARE_PROVIDER_SITE_OTHER): Payer: Self-pay | Admitting: Primary Care

## 2023-06-24 NOTE — Telephone Encounter (Signed)
Requested medication (s) are due for refill today - yes  Requested medication (s) are on the active medication list -yes  Future visit scheduled -no  Last refill: 03/29/23 #30 1RF  Notes to clinic: Please review Rx- for monthly supply- # does not match direction- patient has no followed up in office as directed  Requested Prescriptions  Pending Prescriptions Disp Refills   amLODipine (NORVASC) 5 MG tablet 30 tablet 1     Cardiovascular: Calcium Channel Blockers 2 Passed - 06/21/2023  1:28 PM      Passed - Last BP in normal range    BP Readings from Last 1 Encounters:  03/15/23 110/72         Passed - Last Heart Rate in normal range    Pulse Readings from Last 1 Encounters:  03/15/23 87         Passed - Valid encounter within last 6 months    Recent Outpatient Visits           3 months ago Acute bilateral low back pain without sciatica   [place] [facility] Grayce Sessions, NP   11 months ago Primary hypertension   [place] [facility] Grayce Sessions, NP   1 year ago Impacted cerumen of left ear   [place] [facility] Grayce Sessions, NP   1 year ago Encounter for Medicare annual wellness exam   [place] [facility] Grayce Sessions, NP   2 years ago Primary hypertension   [place] [facility] Grayce Sessions, NP                 Requested Prescriptions  Pending Prescriptions Disp Refills   amLODipine (NORVASC) 5 MG tablet 30 tablet 1     Cardiovascular: Calcium Channel Blockers 2 Passed - 06/21/2023  1:28 PM      Passed - Last BP in normal range    BP Readings from Last 1 Encounters:  03/15/23 110/72         Passed - Last Heart Rate in normal range    Pulse Readings from Last 1 Encounters:  03/15/23 87         Passed - Valid encounter within last 6 months    Recent Outpatient Visits           3 months ago Acute bilateral low back pain  without sciatica   [place] [facility] Grayce Sessions, NP   11 months ago Primary hypertension   [place] [facility] Grayce Sessions, NP   1 year ago Impacted cerumen of left ear   [place] [facility] Grayce Sessions, NP   1 year ago Encounter for Medicare annual wellness exam   [place] [facility] Grayce Sessions, NP   2 years ago Primary hypertension   [place] [facility] Grayce Sessions, NP

## 2023-06-25 NOTE — Telephone Encounter (Signed)
Requested Prescriptions  Pending Prescriptions Disp Refills   amLODipine (NORVASC) 5 MG tablet [Pharmacy Med Name: AMLODIPINE BESYLATE 5MG  TABLETS] 180 tablet 0    Sig: TAKE 2 TABLETS(10 MG) BY MOUTH DAILY     Cardiovascular: Calcium Channel Blockers 2 Passed - 06/24/2023  2:58 PM      Passed - Last BP in normal range    BP Readings from Last 1 Encounters:  03/15/23 110/72         Passed - Last Heart Rate in normal range    Pulse Readings from Last 1 Encounters:  03/15/23 87         Passed - Valid encounter within last 6 months    Recent Outpatient Visits           4 months ago Acute bilateral low back pain without sciatica   [place] [facility] Grayce Sessions, NP   11 months ago Primary hypertension   [place] [facility] Grayce Sessions, NP   1 year ago Impacted cerumen of left ear   [place] [facility] Grayce Sessions, NP   1 year ago Encounter for Medicare annual wellness exam   [place] [facility] Grayce Sessions, NP   2 years ago Primary hypertension   [place] [facility] Grayce Sessions, NP

## 2023-06-26 ENCOUNTER — Other Ambulatory Visit (INDEPENDENT_AMBULATORY_CARE_PROVIDER_SITE_OTHER): Payer: Self-pay | Admitting: Primary Care

## 2023-06-26 NOTE — Telephone Encounter (Signed)
Medication Refill - Medication: amLODipine (NORVASC) 5 MG tablet [[id number]]   (patient would like a call)      Has the patient contacted their pharmacy? Yes.     Preferred Pharmacy (with phone number or street name):  [facility] #[id number] [place] Otto, [place] - [address] AT [facility] [address], [place] [address] Phone: [phone]  Fax: [phone] DEA #: [id number] DAW Reason: --     Has the patient been seen for an appointment in the last year OR does the patient have an upcoming appointment? Yes.    Agent: Please be advised that RX refills may take up to 3 business days. We ask that you follow-up with your pharmacy.

## 2023-06-26 NOTE — Telephone Encounter (Signed)
Duplicate request- filled 06/25/23 Requested Prescriptions  Pending Prescriptions Disp Refills   amLODipine (NORVASC) 5 MG tablet 180 tablet 0     Cardiovascular: Calcium Channel Blockers 2 Passed - 06/26/2023 10:15 AM      Passed - Last BP in normal range    BP Readings from Last 1 Encounters:  03/15/23 110/72         Passed - Last Heart Rate in normal range    Pulse Readings from Last 1 Encounters:  03/15/23 87         Passed - Valid encounter within last 6 months    Recent Outpatient Visits           4 months ago Acute bilateral low back pain without sciatica   [place] [facility] Grayce Sessions, NP   11 months ago Primary hypertension   [place] [facility] Grayce Sessions, NP   1 year ago Impacted cerumen of left ear   [place] [facility] Grayce Sessions, NP   1 year ago Encounter for Medicare annual wellness exam   [place] [facility] Grayce Sessions, NP   2 years ago Primary hypertension   [place] [facility] Grayce Sessions, NP       Future Appointments             In 1 week Randa Evens, Kinnie Scales, NP [place] [facility]

## 2023-06-28 NOTE — Telephone Encounter (Signed)
Will forward to provider  

## 2023-07-08 ENCOUNTER — Ambulatory Visit (INDEPENDENT_AMBULATORY_CARE_PROVIDER_SITE_OTHER): Payer: Self-pay | Admitting: *Deleted

## 2023-07-08 ENCOUNTER — Ambulatory Visit (INDEPENDENT_AMBULATORY_CARE_PROVIDER_SITE_OTHER): Payer: 59 | Admitting: Primary Care

## 2023-07-08 NOTE — Telephone Encounter (Signed)
  Chief Complaint: requesting to cancel appt today due to left ankle swelling and unable to walk. Requesting to reschedule appt for "physical". Wants to f/u with surgeon again before seeing PCP.  Symptoms: left ankle swelling, unable to walk per patient report Frequency: na Pertinent Negatives: Patient denies na Disposition: [] ED /[] Urgent Care (no appt availability in office) / [] Appointment(In office/virtual)/ []  [facility]/ [] Home Care/ [] Refused Recommended Disposition /[x] [place] [facility]/ []  Follow-up with PCP Additional Notes:   Recommended if patient unable to walk to go to ED / advised patient to keep appt today and patient reports he needs to reschedule. Appt today for medication refills. Patient continued to request rescheduled appt rescheduled for 07/29/23.      Reason for Disposition  SEVERE swelling (e.g., can't move swollen ankle at all)    Reports can't walk on left leg due to ankle swelling  Answer Assessment - Initial Assessment Questions 1. LOCATION: "Which ankle is swollen?" "Where is the swelling?"     Left ankle swelling s/p pins in ankle  2. ONSET: "When did the swelling start?"     Na  3. SWELLING: "How bad is the swelling?" Or, "How large is it?" (e.g., mild, moderate, severe; size of localized swelling)    - NONE: No joint swelling.   - LOCALIZED: Localized; small area of puffy or swollen skin (e.g., insect bite, skin irritation).   - MILD: Joint looks or feels mildly swollen or puffy.   - MODERATE: Swollen; interferes with normal activities (e.g., work or school); decreased range of movement; may be limping.   - SEVERE: Very swollen; can't move swollen joint at all; limping a lot or unable to walk.     Reports unable to walk 4. PAIN: "Is there any pain?" If Yes, ask: "How bad is it?" (Scale 1-10; or mild, moderate, severe)   - NONE (0): no pain.   - MILD (1-3): doesn't interfere with normal activities.    - MODERATE (4-7): interferes with  normal activities (e.g., work or school) or awakens from sleep, limping.    - SEVERE (8-10): excruciating pain, unable to do any normal activities, unable to walk.      Na  5. CAUSE: "What do you think caused the ankle swelling?"     S/p surgery and pins in ankle  6. OTHER SYMPTOMS: "Do you have any other symptoms?" (e.g., fever, chest pain, difficulty breathing, calf pain)     Swelling left ankle  7. PREGNANCY: "Is there any chance you are pregnant?" "When was your last menstrual period?"     Na  Protocols used: Ankle Swelling-A-AH

## 2023-07-08 NOTE — Telephone Encounter (Signed)
 Will forward to provider  

## 2023-07-11 ENCOUNTER — Other Ambulatory Visit (INDEPENDENT_AMBULATORY_CARE_PROVIDER_SITE_OTHER): Payer: Self-pay | Admitting: Primary Care

## 2023-07-11 MED ORDER — AMLODIPINE BESYLATE 10 MG PO TABS: 10.0000 mg | ORAL_TABLET | Freq: Every day | ORAL | 0 refills | Status: DC

## 2023-07-11 NOTE — Telephone Encounter (Signed)
Needs appt

## 2023-07-15 ENCOUNTER — Other Ambulatory Visit (INDEPENDENT_AMBULATORY_CARE_PROVIDER_SITE_OTHER): Payer: Self-pay

## 2023-07-15 MED ORDER — AMLODIPINE BESYLATE 10 MG PO TABS: 10.0000 mg | ORAL_TABLET | Freq: Every day | ORAL | Status: DC

## 2023-07-29 ENCOUNTER — Encounter (INDEPENDENT_AMBULATORY_CARE_PROVIDER_SITE_OTHER): Payer: 59 | Admitting: Primary Care

## 2023-07-30 ENCOUNTER — Encounter (INDEPENDENT_AMBULATORY_CARE_PROVIDER_SITE_OTHER): Payer: Self-pay

## 2023-11-29 ENCOUNTER — Other Ambulatory Visit (INDEPENDENT_AMBULATORY_CARE_PROVIDER_SITE_OTHER): Payer: Self-pay | Admitting: Primary Care

## 2023-11-29 NOTE — Telephone Encounter (Signed)
Medication Refill -  Most Recent Primary Care Visit:  Provider: Grayce Sessions  Department: [facility]  Visit Type: OFFICE VISIT  Date: 02/25/2023  Medication: amLODipine (NORVASC) 10 MG Pt requesting courtesy refill until appointment on 12/09/2023.   Has the patient contacted their pharmacy? Yes (Agent: If yes, when and what did the pharmacy advise?)Contact office.   Is this the correct pharmacy for this prescription? Yes This is the patient's preferred pharmacy:  [facility] #[id number] [facility], [place] - [address] AT [facility] [address] [place] [address] Phone: [phone] Fax: [phone]   Has the prescription been filled recently? No  Is the patient out of the medication? No, almost out only has two pills left.  Has the patient been seen for an appointment in the last year OR does the patient have an upcoming appointment? Yes  Can we respond through MyChart? No  Agent: Please be advised that Rx refills may take up to 3 business days. We ask that you follow-up with your pharmacy.

## 2023-11-29 NOTE — Telephone Encounter (Signed)
Pt has canceled 2 and no showed 2 appointments. Please reach out to pt to make an appt with provider.  Unable to refill amlodipine till pt is seen

## 2023-12-02 NOTE — Telephone Encounter (Signed)
Requested medication (s) are due for refill today: yes  Requested medication (s) are on the active medication list: yes  Last refill:  07/15/23 #90  Future visit scheduled: yes  Notes to clinic:  pt has not been in office since March and has several cancellations and no f/u. This was refused 11/29/23. Sending this to office to review.   Requested Prescriptions  Pending Prescriptions Disp Refills   amLODipine (NORVASC) 10 MG tablet 90 tablet O    Sig: Take 1 tablet (10 mg total) by mouth daily.     Cardiovascular: Calcium Channel Blockers 2 Failed - 11/29/2023  5:45 PM      Failed - Valid encounter within last 6 months    Recent Outpatient Visits           9 months ago Acute bilateral low back pain without sciatica   [place] [facility] Grayce Sessions, NP   1 year ago Primary hypertension   [place] [facility] Grayce Sessions, NP   1 year ago Impacted cerumen of left ear   [place] [facility] Grayce Sessions, NP   2 years ago Encounter for Medicare annual wellness exam   [place] [facility] Grayce Sessions, NP   2 years ago Primary hypertension   [place] [facility] Grayce Sessions, NP       Future Appointments             In 1 week Grayce Sessions, NP  [facility]            Passed - Last BP in normal range    BP Readings from Last 1 Encounters:  03/15/23 110/72         Passed - Last Heart Rate in normal range    Pulse Readings from Last 1 Encounters:  03/15/23 87

## 2023-12-06 ENCOUNTER — Telehealth (INDEPENDENT_AMBULATORY_CARE_PROVIDER_SITE_OTHER): Payer: Self-pay | Admitting: Primary Care

## 2023-12-06 NOTE — Telephone Encounter (Signed)
Called pt to confirm apt. Phone number is unavailable.

## 2023-12-09 ENCOUNTER — Encounter (INDEPENDENT_AMBULATORY_CARE_PROVIDER_SITE_OTHER): Payer: Self-pay | Admitting: Primary Care

## 2023-12-09 ENCOUNTER — Ambulatory Visit (INDEPENDENT_AMBULATORY_CARE_PROVIDER_SITE_OTHER): Payer: 59 | Admitting: Primary Care

## 2023-12-09 VITALS — BP 128/85 | HR 71 | Resp 16 | Ht 68.0 in | Wt 223.0 lb

## 2023-12-09 DIAGNOSIS — I10 Essential (primary) hypertension: Secondary | ICD-10-CM

## 2023-12-09 DIAGNOSIS — Z2821 Immunization not carried out because of patient refusal: Secondary | ICD-10-CM

## 2023-12-09 DIAGNOSIS — Z1211 Encounter for screening for malignant neoplasm of colon: Secondary | ICD-10-CM

## 2023-12-09 DIAGNOSIS — Z23 Encounter for immunization: Secondary | ICD-10-CM | POA: Diagnosis not present

## 2023-12-09 DIAGNOSIS — Z125 Encounter for screening for malignant neoplasm of prostate: Secondary | ICD-10-CM

## 2023-12-09 DIAGNOSIS — Z13220 Encounter for screening for lipoid disorders: Secondary | ICD-10-CM

## 2023-12-09 DIAGNOSIS — Z1159 Encounter for screening for other viral diseases: Secondary | ICD-10-CM

## 2023-12-09 DIAGNOSIS — Z122 Encounter for screening for malignant neoplasm of respiratory organs: Secondary | ICD-10-CM

## 2023-12-09 MED ORDER — AMLODIPINE BESYLATE 10 MG PO TABS: 10.0000 mg | ORAL_TABLET | Freq: Every day | ORAL | 1 refills | Status: DC

## 2023-12-09 NOTE — Progress Notes (Signed)
[facility]  Jared Rhodes, is a 72 y.o. male  GNF:[id number]  QIO:[id number]  DOB - 1951-11-10  Chief Complaint  Patient presents with   Hypertension       Subjective:   Jared Rhodes is a 71 y.o. male here today for a follow up HTN . Patient has No headache, No chest pain, No abdominal pain - No Nausea, No new weakness tingling or numbness, No Cough - shortness of breath.    No problems updated.  Comprehensive ROS Pertinent positive and negative noted in HPI   Allergies  Allergen Reactions   Penicillins Rash    Has patient had a PCN reaction causing immediate rash, facial/tongue/throat swelling, SOB or lightheadedness with hypotension: UnknowN Has patient had a PCN reaction causing severe rash involving mucus membranes or skin necrosis: UnknowN Has patient had a PCN reaction that required hospitalization: Unknown Has patient had a PCN reaction occurring within the last 10 years: /Unknown If all of the above answers are "NO", then may proceed with Cephalosporin use.     Past Medical History:  Diagnosis Date   Hypertension    Prostate cancer (HCC) 2018   radation 40 treatments     Current Outpatient Medications on File Prior to Visit  Medication Sig Dispense Refill   acetaminophen (TYLENOL) 325 MG tablet Take 650 mg by mouth every 6 (six) hours as needed for moderate pain.     No current facility-administered medications on file prior to visit.   Health Maintenance  Topic Date Due   COVID-19 Vaccine (1) Never done   Hepatitis C Screening  Never done   Zoster (Shingles) Vaccine (1 of 2) Never done   Screening for Lung Cancer  11/30/2006   Medicare Annual Wellness Visit  11/09/2022   Colon Cancer Screening  01/28/2023   Flu Shot  03/23/2024*   DTaP/Tdap/Td vaccine (2 - Td or Tdap) 07/17/2024   Pneumonia Vaccine  Completed   HPV Vaccine  Aged Out  *Topic was postponed. The date shown is not the original due date.    Objective:    Vitals:   12/09/23 1036 12/09/23 1037  BP: (!) 145/88 128/85  Pulse: 71   Resp: 16   SpO2: 98%   Weight: 223 lb (101.2 kg)   Height: 5\' 8"  (1.727 m)    BP Readings from Last 3 Encounters:  12/09/23 128/85  03/15/23 110/72  02/25/23 93/64      Physical Exam Constitutional:      Appearance: He is obese.  HENT:     Head: Normocephalic.     Right Ear: External ear normal.     Left Ear: Tympanic membrane and external ear normal.     Nose: Nose normal.  Eyes:     Extraocular Movements: Extraocular movements intact.  Cardiovascular:     Rate and Rhythm: Normal rate and regular rhythm.  Pulmonary:     Effort: Pulmonary effort is normal.     Breath sounds: Normal breath sounds.  Abdominal:     General: Bowel sounds are normal. There is distension.     Palpations: Abdomen is soft.  Musculoskeletal:        General: Normal range of motion.     Cervical back: Normal range of motion and neck supple.  Skin:    General: Skin is warm and dry.  Neurological:     Mental Status: He is alert and oriented to person, place, and time.  Psychiatric:        Mood and  Affect: Mood normal.        Behavior: Behavior normal.     Assessment & Plan  Screening for colon cancer -     Ambulatory referral to Gastroenterology  Need for hepatitis C screening test -     HCV Ab w Reflex to Quant PCR  Primary hypertension -     CBC with Differential/Platelet -     CMP14+EGFR -     amLODIPine Besylate; Take 1 tablet (10 mg total) by mouth daily.  Dispense: 90 tablet; Refill: 1  Prostate cancer screening -     PSA  Screening for lung cancer -     CT CHEST LUNG CANCER SCREENING LOW DOSE WO CONTRAST; Future  Screening cholesterol level -     Lipid panel  Influenza vaccination declined  Encounter for immunization -     Pneumococcal conjugate vaccine 20-valent     Patient have been counseled extensively about nutrition and exercise. Other issues discussed during this visit include: low  cholesterol diet, weight control and daily exercise, foot care, annual eye examinations at Ophthalmology, importance of adherence with medications and regular follow-up. We also discussed long term complications of uncontrolled diabetes and hypertension.   Return in about 3 months (around 03/08/2024).  The patient was given clear instructions to go to ER or return to medical center if symptoms don't improve, worsen or new problems develop. The patient verbalized understanding. The patient was told to call to get lab results if they haven't heard anything in the next week.   This note has been created with Education officer, environmental. Any transcriptional errors are unintentional.   Grayce Sessions, NP 12/09/2023, 11:19 AM

## 2023-12-09 NOTE — Patient Instructions (Signed)
Pneumococcal Conjugate Vaccine: What You Need to Know Many vaccine information statements are available in Spanish and other languages. See PromoAge.com.br. 1. Why get vaccinated? Pneumococcal conjugate vaccine can prevent pneumococcal disease. Pneumococcal disease refers to any illness caused by pneumococcal bacteria. These bacteria can cause many types of illnesses, including pneumonia, which is an infection of the lungs. Pneumococcal bacteria are one of the most common causes of pneumonia. Besides pneumonia, pneumococcal bacteria can also cause: Ear infections Sinus infections Meningitis (infection of the tissue covering the brain and spinal cord) Bacteremia (infection of the blood) Anyone can get pneumococcal disease, but children under 13 years old, people with certain medical conditions or other risk factors, and adults 65 years or older are at the highest risk. Most pneumococcal infections are mild. However, some can result in long-term problems, such as brain damage or hearing loss. Meningitis, bacteremia, and pneumonia caused by pneumococcal disease can be fatal. 2. Pneumococcal conjugate vaccine Pneumococcal conjugate vaccine helps protect against bacteria that cause pneumococcal disease. There are three pneumococcal conjugate vaccines (PCV13, PCV15, and PCV20). The different vaccines are recommended for different people based on age and medical status. Your health care provider can help you determine which type of pneumococcal conjugate vaccine, and how many doses, you should receive. Infants and young children usually need 4 doses of pneumococcal conjugate vaccine. These doses are recommended at 2, 4, 6, and 58-29 months of age. Older children and adolescents might need pneumococcal conjugate vaccine depending on their age and medical conditions or other risk factors if they did not receive the recommended doses as infants or young children. Adults 19 through 11 years old with certain  medical conditions or other risk factors who have not already received pneumococcal conjugate vaccine should receive pneumococcal conjugate vaccine. Adults 65 years or older who have not previously received pneumococcal conjugate vaccine should receive pneumococcal conjugate vaccine. Some people with certain medical conditions are also recommended to receive pneumococcal polysaccharide vaccine (a different type of pneumococcal vaccine known as PPSV23). Some adults who have previously received a pneumococcal conjugate vaccine may be recommended to receive another pneumococcal conjugate vaccine. 3. Talk with your health care provider Tell your vaccination provider if the person getting the vaccine: Has had an allergic reaction after a previous dose of any type of pneumococcal conjugate vaccine (PCV13, PCV15, PCV20, or an earlier pneumococcal conjugate vaccine known as PCV7), or to any vaccine containing diphtheria toxoid (for example, DTaP), or has any severe, life-threatening allergies In some cases, your health care provider may decide to postpone pneumococcal conjugate vaccination until a future visit. People with minor illnesses, such as a cold, may be vaccinated. People who are moderately or severely ill should usually wait until they recover. Your health care provider can give you more information. 4. Risks of a vaccine reaction Redness, swelling, pain, or tenderness where the shot is given, and fever, loss of appetite, fussiness (irritability), feeling tired, headache, muscle aches, joint pain, and chills can happen after pneumococcal conjugate vaccination. Young children may be at increased risk for seizures caused by fever after a pneumococcal conjugate vaccine if it is administered at the same time as inactivated influenza vaccine. Ask your health care provider for more information. People sometimes faint after medical procedures, including vaccination. Tell your provider if you feel dizzy or  have vision changes or ringing in the ears. As with any medicine, there is a very remote chance of a vaccine causing a severe allergic reaction, other serious injury, or death. 5.  What if there is a serious problem? An allergic reaction could occur after the vaccinated person leaves the clinic. If you see signs of a severe allergic reaction (hives, swelling of the face and throat, difficulty breathing, a fast heartbeat, dizziness, or weakness), call 9-1-1 and get the person to the nearest hospital. For other signs that concern you, call your health care provider. Adverse reactions should be reported to the Vaccine Adverse Event Reporting System (VAERS). Your health care provider will usually file this report, or you can do it yourself. Visit the VAERS website at www.vaers.LAgents.no or call 913-439-3438. VAERS is only for reporting reactions, and VAERS staff members do not give medical advice. 6. The National Vaccine Injury Compensation Program The [employer] Vaccine Injury Compensation Program (VICP) is a federal program that was created to compensate people who may have been injured by certain vaccines. Claims regarding alleged injury or death due to vaccination have a time limit for filing, which may be as short as two years. Visit the VICP website at SpiritualWord.at or call 860 253 0829 to learn about the program and about filing a claim. 7. How can I learn more? Ask your health care provider. Call your local or state health department. Visit the website of the Food and Drug Administration (FDA) for vaccine package inserts and additional information at FinderList.no. Contact the Centers for Disease Control and Prevention (CDC): Call 4055360835 (1-800-CDC-INFO) or Visit CDC's website at PicCapture.uy. Source: CDC Vaccine Information Statement (Interim) Pneumococcal Conjugate Vaccine (05/04/2022) This same material is available at  FootballExhibition.com.br for no charge. This information is not intended to replace advice given to you by your health care provider. Make sure you discuss any questions you have with your health care provider. Document Revised: 03/27/2023 Document Reviewed: 12/31/2022 Elsevier Patient Education  2024 [employer].

## 2023-12-10 LAB — CBC WITH DIFFERENTIAL/PLATELET
Basophils Absolute: 0 10*3/uL (ref 0.0–0.2)
Basos: 0 %
EOS (ABSOLUTE): 0.2 10*3/uL (ref 0.0–0.4)
Eos: 5 %
Hematocrit: 38.4 % (ref 37.5–51.0)
Hemoglobin: 12.3 g/dL — ABNORMAL LOW (ref 13.0–17.7)
Immature Grans (Abs): 0 10*3/uL (ref 0.0–0.1)
Immature Granulocytes: 0 %
Lymphocytes Absolute: 1.5 10*3/uL (ref 0.7–3.1)
Lymphs: 33 %
MCH: 28.1 pg (ref 26.6–33.0)
MCHC: 32 g/dL (ref 31.5–35.7)
MCV: 88 fL (ref 79–97)
Monocytes Absolute: 0.4 10*3/uL (ref 0.1–0.9)
Monocytes: 9 %
Neutrophils Absolute: 2.4 10*3/uL (ref 1.4–7.0)
Neutrophils: 53 %
Platelets: 247 10*3/uL (ref 150–450)
RBC: 4.38 x10E6/uL (ref 4.14–5.80)
RDW: 13.6 % (ref 11.6–15.4)
WBC: 4.6 10*3/uL (ref 3.4–10.8)

## 2023-12-10 LAB — LIPID PANEL
Chol/HDL Ratio: 2.8 {ratio} (ref 0.0–5.0)
Cholesterol, Total: 168 mg/dL (ref 100–199)
HDL: 61 mg/dL (ref >39)
LDL Chol Calc (NIH): 98 mg/dL (ref 0–99)
Triglycerides: 43 mg/dL (ref 0–149)
VLDL Cholesterol Cal: 9 mg/dL (ref 5–40)

## 2023-12-10 LAB — CMP14+EGFR
ALT: 12 [IU]/L (ref 0–44)
AST: 16 [IU]/L (ref 0–40)
Albumin: 4 g/dL (ref 3.8–4.8)
Alkaline Phosphatase: 55 [IU]/L (ref 44–121)
BUN/Creatinine Ratio: 19 (ref 10–24)
BUN: 13 mg/dL (ref 8–27)
Bilirubin Total: 0.3 mg/dL (ref 0.0–1.2)
CO2: 26 mmol/L (ref 20–29)
Calcium: 9.1 mg/dL (ref 8.6–10.2)
Chloride: 103 mmol/L (ref 96–106)
Creatinine, Ser: 0.7 mg/dL — ABNORMAL LOW (ref 0.76–1.27)
Globulin, Total: 3.1 g/dL (ref 1.5–4.5)
Glucose: 75 mg/dL (ref 70–99)
Potassium: 4.5 mmol/L (ref 3.5–5.2)
Sodium: 142 mmol/L (ref 134–144)
Total Protein: 7.1 g/dL (ref 6.0–8.5)
eGFR: 98 mL/min/{1.73_m2} (ref >59)

## 2023-12-10 LAB — HCV INTERPRETATION

## 2023-12-10 LAB — HCV AB W REFLEX TO QUANT PCR: HCV Ab: NONREACTIVE

## 2023-12-10 LAB — PSA: Prostate Specific Ag, Serum: 1.2 ng/mL (ref 0.0–4.0)

## 2023-12-16 ENCOUNTER — Telehealth: Payer: Self-pay | Admitting: Primary Care

## 2023-12-16 NOTE — Telephone Encounter (Signed)
Tried reaching out to pt pt didn't answer

## 2023-12-16 NOTE — Telephone Encounter (Signed)
Copied from CRM [id number]. Topic: General - Other >> Dec 16, 2023 10:22 AM Turkey B wrote: Reason for CRM: pt called in wants to know why he has an appt at [facility] imaging, and DR Randa Evens didn't tell him about this for chest/lung looks like, He says he already has an appt with a Dr Leavy Cella on Jan 3

## 2024-01-30 ENCOUNTER — Encounter (INDEPENDENT_AMBULATORY_CARE_PROVIDER_SITE_OTHER): Payer: Self-pay | Admitting: *Deleted

## 2024-02-10 ENCOUNTER — Encounter: Payer: Self-pay | Admitting: Gastroenterology

## 2024-02-19 ENCOUNTER — Emergency Department (HOSPITAL_COMMUNITY)
Admission: EM | Admit: 2024-02-19 | Discharge: 2024-02-20 | Disposition: A | Discharge status: Home / Self Care | Payer: 59 | Attending: Emergency Medicine | Admitting: Emergency Medicine

## 2024-02-19 DIAGNOSIS — M545 Low back pain, unspecified: Secondary | ICD-10-CM | POA: Diagnosis not present

## 2024-02-19 DIAGNOSIS — R109 Unspecified abdominal pain: Secondary | ICD-10-CM | POA: Diagnosis not present

## 2024-02-19 DIAGNOSIS — K429 Umbilical hernia without obstruction or gangrene: Secondary | ICD-10-CM | POA: Insufficient documentation

## 2024-02-20 ENCOUNTER — Emergency Department (HOSPITAL_COMMUNITY): Payer: 59

## 2024-02-20 DIAGNOSIS — K429 Umbilical hernia without obstruction or gangrene: Secondary | ICD-10-CM | POA: Diagnosis not present

## 2024-02-20 DIAGNOSIS — R109 Unspecified abdominal pain: Secondary | ICD-10-CM | POA: Diagnosis not present

## 2024-02-20 LAB — URINALYSIS, ROUTINE W REFLEX MICROSCOPIC
Bilirubin Urine: NEGATIVE
Glucose, UA: NEGATIVE mg/dL
Hgb urine dipstick: NEGATIVE
Ketones, ur: NEGATIVE mg/dL
Leukocytes,Ua: NEGATIVE
Nitrite: NEGATIVE
Protein, ur: NEGATIVE mg/dL
Specific Gravity, Urine: >1.03 — ABNORMAL HIGH (ref 1.005–1.030)
pH: 6 (ref 5.0–8.0)

## 2024-02-20 MED ORDER — METHOCARBAMOL 500 MG PO TABS: 500.0000 mg | ORAL_TABLET | Freq: Two times a day (BID) | ORAL | 0 refills | Status: DC

## 2024-02-20 MED ORDER — KETOROLAC TROMETHAMINE 30 MG/ML IJ SOLN
30.0000 mg | Freq: Once | INTRAMUSCULAR | Status: AC
  Administered 2024-02-20: 30 mg via INTRAMUSCULAR
  Filled 2024-02-20: qty 1

## 2024-02-20 MED ORDER — NAPROXEN 500 MG PO TABS: 500.0000 mg | ORAL_TABLET | Freq: Two times a day (BID) | ORAL | 0 refills | Status: DC

## 2024-02-20 NOTE — ED Triage Notes (Signed)
 Patient arrived with complaints of right sided back pain that radiates to hip over the last week, states it worsens with movement and when laying down.

## 2024-02-20 NOTE — ED Provider Notes (Addendum)
 [place] EMERGENCY DEPARTMENT AT [facility] Provider Note   CSN: [id number] Arrival date & time: 02/19/24  2350     History  Chief Complaint  Patient presents with   Back Pain    Jared Rhodes. is a 73 y.o. male.  The history is provided by the patient.  Flank Pain This is a new problem. The current episode started 3 to 5 hours ago. The problem occurs constantly. The problem has not changed since onset.Pertinent negatives include no chest pain, no headaches and no shortness of breath. Nothing aggravates the symptoms. Nothing relieves the symptoms. He has tried nothing for the symptoms. The treatment provided no relief.       Home Medications Prior to Admission medications   Medication Sig Start Date End Date Taking? Authorizing Provider  acetaminophen (TYLENOL) 325 MG tablet Take 650 mg by mouth every 6 (six) hours as needed for moderate pain.    [provider]  amLODipine (NORVASC) 10 MG tablet Take 1 tablet (10 mg total) by mouth daily. 12/09/23   Grayce Sessions, NP      Allergies    Penicillins    Review of Systems   Review of Systems  Constitutional:  Negative for fever.  Respiratory:  Negative for shortness of breath.   Cardiovascular:  Negative for chest pain.  Gastrointestinal:  Negative for nausea and vomiting.  Genitourinary:  Positive for flank pain.  Neurological:  Negative for headaches.  All other systems reviewed and are negative.   Physical Exam Updated Vital Signs BP 129/84 (BP Location: Right Arm)   Pulse 74   Temp 97.9 F (36.6 C) (Oral)   Resp 16   SpO2 98%  Physical Exam Vitals and nursing note reviewed.  Constitutional:      General: He is not in acute distress.    Appearance: Normal appearance. He is well-developed. He is not diaphoretic.  HENT:     Head: Normocephalic and atraumatic.     Nose: Nose normal.  Eyes:     Conjunctiva/sclera: Conjunctivae normal.     Pupils: Pupils are equal, round,  and reactive to light.  Cardiovascular:     Rate and Rhythm: Normal rate and regular rhythm.     Pulses: Normal pulses.     Heart sounds: Normal heart sounds.  Pulmonary:     Effort: Pulmonary effort is normal.     Breath sounds: Normal breath sounds. No wheezing or rales.  Abdominal:     General: Bowel sounds are normal.     Palpations: Abdomen is soft.     Tenderness: There is no abdominal tenderness. There is no guarding or rebound.  Musculoskeletal:        General: Normal range of motion.     Cervical back: Normal range of motion and neck supple.  Skin:    General: Skin is warm and dry.     Capillary Refill: Capillary refill takes less than 2 seconds.  Neurological:     General: No focal deficit present.     Mental Status: He is alert and oriented to person, place, and time.     Deep Tendon Reflexes: Reflexes normal.  Psychiatric:        Mood and Affect: Mood normal.        Behavior: Behavior normal.     ED Results / Procedures / Treatments   Labs (all labs ordered are listed, but only abnormal results are displayed) Results for orders placed or performed during the hospital  encounter of 02/19/24  Urinalysis, Routine w reflex microscopic -Urine, Clean Catch   Collection Time: 02/20/24  4:01 AM  Result Value Ref Range   Color, Urine YELLOW YELLOW   APPearance CLEAR CLEAR   Specific Gravity, Urine >1.030 (H) 1.005 - 1.030   pH 6.0 5.0 - 8.0   Glucose, UA NEGATIVE NEGATIVE mg/dL   Hgb urine dipstick NEGATIVE NEGATIVE   Bilirubin Urine NEGATIVE NEGATIVE   Ketones, ur NEGATIVE NEGATIVE mg/dL   Protein, ur NEGATIVE NEGATIVE mg/dL   Nitrite NEGATIVE NEGATIVE   Leukocytes,Ua NEGATIVE NEGATIVE   CT Renal Stone Study Result Date: 02/20/2024 CLINICAL DATA:  Right flank pain, abdominal pain EXAM: CT ABDOMEN AND PELVIS WITHOUT CONTRAST TECHNIQUE: Multidetector CT imaging of the abdomen and pelvis was performed following the standard protocol without IV contrast. RADIATION DOSE  REDUCTION: This exam was performed according to the departmental dose-optimization program which includes automated exposure control, adjustment of the mA and/or kV according to patient size and/or use of iterative reconstruction technique. COMPARISON:  11/18/2018 FINDINGS: Lower chest: No acute abnormality Hepatobiliary: No focal hepatic abnormality. Gallbladder unremarkable. Pancreas: No focal abnormality or ductal dilatation. Spleen: No focal abnormality.  Normal size. Adrenals/Urinary Tract: No adrenal abnormality. No focal renal abnormality. No stones or hydronephrosis. Urinary bladder is unremarkable. Stomach/Bowel: Umbilical hernia containing small bowel loops. No evidence of bowel obstruction. Stomach, large and small bowel grossly unremarkable. Vascular/Lymphatic: No evidence of aneurysm or adenopathy. Reproductive: 2 No visible focal abnormality. Other: No free fluid or free air. Musculoskeletal: Prior right hip replacement. Mild compression fractures through the superior endplates of L2 and L4, stable since 2019. No acute bony abnormality. IMPRESSION: Moderate-sized umbilical hernia containing small bowel loops. No evidence of bowel obstruction. No acute findings. Electronically Signed   By: Charlett Nose M.D.   On: 02/20/2024 03:31    Radiology No results found.  Procedures Hernia reduction  Date/Time: 02/20/2024 5:27 AM  Performed by: Cy Blamer, MD Authorized by: Cy Blamer, MD  Consent: Verbal consent obtained. Consent given by: patient Patient identity confirmed: arm band Preparation: Patient was prepped and draped in the usual sterile fashion. Local anesthesia used: no  Anesthesia: Local anesthesia used: no  Sedation: Patient sedated: no  Patient tolerance: patient tolerated the procedure well with no immediate complications Comments: Hernia back in        Medications Ordered in ED Medications  ketorolac (TORADOL) 30 MG/ML injection 30 mg (has no administration  in time range)    ED Course/ Medical Decision Making/ A&P                                 Medical Decision Making Patient with low right back pain   Amount and/or Complexity of Data Reviewed External Data Reviewed: notes.    Details: Previous notes reviewed  Labs: ordered.    Details: Urine is negative for UTI Radiology: ordered and independent interpretation performed.    Details: No stones on CT by me   Risk Prescription drug management. Risk Details: Hernia reduced.  Will have patient follow up with general surgery for definitive repair.  Will treat back pain that is mechanical in nature.  Stable for discharge.  Strict return.      Final Clinical Impression(s) / ED Diagnoses Final diagnoses:  None   Return for intractable cough, coughing up blood, fevers > 100.4 unrelieved by medication, shortness of breath, intractable vomiting, chest pain, shortness of breath, weakness,  numbness, changes in speech, facial asymmetry, abdominal pain, passing out, Inability to tolerate liquids or food, cough, altered mental status or any concerns. No signs of systemic illness or infection. The patient is nontoxic-appearing on exam and vital signs are within normal limits.  I have reviewed the triage vital signs and the nursing notes. Pertinent labs & imaging results that were available during my care of the patient were reviewed by me and considered in my medical decision making (see chart for details). After history, exam, and medical workup I feel the patient has been appropriately medically screened and is safe for discharge home. Pertinent diagnoses were discussed with the patient. Patient was given return precautions.      Jared Wendorf, MD 02/20/24 [phone]

## 2024-02-20 NOTE — ED Notes (Signed)
 Pt refusing labs at this time, pt expressed frusteration over wait time, informed pt that the bloodwork will not take too long. Pt continues to refuse, notified EDP.

## 2024-02-20 NOTE — ED Notes (Signed)
 Pt requested to wait on labs until after CT resulted. Pt in restroom trying to provide urine specimen

## 2024-03-02 ENCOUNTER — Ambulatory Visit (AMBULATORY_SURGERY_CENTER): Payer: 59 | Admitting: *Deleted

## 2024-03-02 VITALS — Ht 68.0 in | Wt 216.0 lb

## 2024-03-02 DIAGNOSIS — Z860100 Personal history of colon polyps, unspecified: Secondary | ICD-10-CM

## 2024-03-02 MED ORDER — SUFLAVE 178.7 G PO SOLR: 1.0000 | Freq: Once | ORAL | 0 refills | Status: AC

## 2024-03-02 NOTE — Progress Notes (Signed)
 Pt's name and DOB verified at the beginning of the pre-visit wit 2 identifiers  Permission given to speak with  Pt denies any difficulty with ambulating,sitting, laying down or rolling side to side  Pt has no issues with ambulation   Pt has no issues moving head neck or swallowing  No egg or soy allergy known to patient   No issues known to pt with past sedation with any surgeries or procedures  Pt denies having issues being intubatedNo FH of Malignant Hyperthermia  Pt is not on diet pills or shots  Pt is not on home 02   Pt is not on blood thinners   Pt denies issues with constipation   Pt has frequent issues with constipation RN instructed pt to use Miralax per bottles instructions a week before prep days. Pt states they will  Pt is not on dialysis  Pt denise any abnormal heart rhythms   Pt denies any upcoming cardiac testing  Patient's chart reviewed by Cathlyn Parsons CNRA prior to pre-visit and patient appropriate for the [facility].  Pre-visit completed and red dot placed by patient's name on their procedure day (on provider's schedule).    Pt states weight is 2316 lb  IInstructions reviewed. Pt given [facility], [facility] main # and MD on call # prior to instructions.  Pt states understanding of instructions. Instructed to review again prior to procedure. Pt states they will.   Informed pt that they will receive a text or  call from [facility] regarding there prep med.

## 2024-03-13 ENCOUNTER — Other Ambulatory Visit: Payer: Self-pay | Admitting: Surgery

## 2024-03-13 DIAGNOSIS — K432 Incisional hernia without obstruction or gangrene: Secondary | ICD-10-CM | POA: Diagnosis not present

## 2024-03-23 ENCOUNTER — Encounter: Payer: 59 | Admitting: Gastroenterology

## 2024-03-23 ENCOUNTER — Telehealth: Payer: Self-pay | Admitting: Gastroenterology

## 2024-03-23 ENCOUNTER — Encounter: Admitting: Gastroenterology

## 2024-03-23 NOTE — Telephone Encounter (Signed)
 Good Afternoon Dr. Myrtie Neither,   We Tried calling this patient  twice this afternoon at 1:15pm and 1:30 .  The phone line was busy and we could not leave a message.    I will NO SHOW  this patient.  [employer]

## 2024-04-22 NOTE — Pre-Procedure Instructions (Signed)
 Surgical Instructions   Your procedure is scheduled on Thursday May 8th. Report to [facility] Main Entrance "A" at 7:00 A.M., then check in with the Admitting office. Any questions or running late day of surgery: call [phone]  Questions prior to your surgery date: call [phone], Monday-Friday, 8am-4pm. If you experience any cold or flu symptoms such as cough, fever, chills, shortness of breath, etc. between now and your scheduled surgery, please notify us  at the above number.     Remember:  Do not eat after midnight the night before your surgery   You may drink clear liquids until 6:00 am the morning of your surgery.   Clear liquids allowed are: Water, Non-Citrus Juices (without pulp), Carbonated Beverages, Clear Tea (no milk, honey, etc.), Black Coffee Only (NO MILK, CREAM OR POWDERED CREAMER of any kind), and Gatorade.  Patient Instructions  The night before surgery:  No food after midnight. ONLY clear liquids after midnight  The day of surgery (if you do NOT have diabetes):  Drink ONE (1) Pre-Surgery Clear Ensure by 6:00 am the morning of surgery. Drink in one sitting. Do not sip.  This drink was given to you during your hospital  pre-op appointment visit.  Nothing else to drink after completing the  Pre-Surgery Clear Ensure.    Take these medicines the morning of surgery with A SIP OF WATER  amLODipine  (NORVASC )  methocarbamol  (ROBAXIN )  solifenacin (VESICARE)    May take these medicines IF NEEDED: acetaminophen  (TYLENOL )     One week prior to surgery, STOP taking any Aspirin  (unless otherwise instructed by your surgeon) Aleve , Naproxen , Ibuprofen , Motrin , Advil , Goody's, BC's, all herbal medications, fish oil, and non-prescription vitamins.                     Do NOT Smoke (Tobacco/Vaping) for 24 hours prior to your procedure.  If you use a CPAP at night, you may bring your mask/headgear for your overnight stay.   You will be asked to remove any  contacts, glasses, piercing's, hearing aid's, dentures/partials prior to surgery. Please bring cases for these items if needed.    Patients discharged the day of surgery will not be allowed to drive home, and someone needs to stay with them for 24 hours.  SURGICAL WAITING ROOM VISITATION Patients may have no more than 2 support people in the waiting area - these visitors may rotate.   Pre-op nurse will coordinate an appropriate time for 1 ADULT support person, who may not rotate, to accompany patient in pre-op.  Children under the age of 51 must have an adult with them who is not the patient and must remain in the main waiting area with an adult.  If the patient needs to stay at the hospital during part of their recovery, the visitor guidelines for inpatient rooms apply.  Please refer to the [facility] website for the visitor guidelines for any additional information.   If you received a COVID test during your pre-op visit  it is requested that you wear a mask when out in public, stay away from anyone that may not be feeling well and notify your surgeon if you develop symptoms. If you have been in contact with anyone that has tested positive in the last 10 days please notify you surgeon.      Pre-operative CHG Bathing Instructions   You can play a key role in reducing the risk of infection after surgery. Your skin needs to be as free of  germs as possible. You can reduce the number of germs on your skin by washing with CHG (chlorhexidine  gluconate) soap before surgery. CHG is an antiseptic soap that kills germs and continues to kill germs even after washing.   DO NOT use if you have an allergy to chlorhexidine /CHG or antibacterial soaps. If your skin becomes reddened or irritated, stop using the CHG and notify one of our RNs at [phone].              TAKE A SHOWER THE NIGHT BEFORE SURGERY AND THE DAY OF SURGERY    Please keep in mind the following:  DO NOT shave, including legs and  underarms, 48 hours prior to surgery.   You may shave your face before/day of surgery.  Place clean sheets on your bed the night before surgery Use a clean washcloth (not used since being washed) for each shower. DO NOT sleep with pet's night before surgery.  CHG Shower Instructions:  Wash your face and private area with normal soap. If you choose to wash your hair, wash first with your normal shampoo.  After you use shampoo/soap, rinse your hair and body thoroughly to remove shampoo/soap residue.  Turn the water OFF and apply half the bottle of CHG soap to a CLEAN washcloth.  Apply CHG soap ONLY FROM YOUR NECK DOWN TO YOUR TOES (washing for 3-5 minutes)  DO NOT use CHG soap on face, private areas, open wounds, or sores.  Pay special attention to the area where your surgery is being performed.  If you are having back surgery, having someone wash your back for you may be helpful. Wait 2 minutes after CHG soap is applied, then you may rinse off the CHG soap.  Pat dry with a clean towel  Put on clean pajamas    Additional instructions for the day of surgery: DO NOT APPLY any lotions, deodorants, cologne, or perfumes.   Do not wear jewelry or makeup Do not wear nail polish, gel polish, artificial nails, or any other type of covering on natural nails (fingers and toes) Do not bring valuables to the hospital. [facility] is not responsible for valuables/personal belongings. Put on clean/comfortable clothes.  Please brush your teeth.  Ask your nurse before applying any prescription medications to the skin.

## 2024-04-23 ENCOUNTER — Encounter (HOSPITAL_COMMUNITY)
Admission: RE | Admit: 2024-04-23 | Discharge: 2024-04-23 | Disposition: A | Discharge status: Home / Self Care | Source: Ambulatory Visit | Attending: Surgery | Admitting: Surgery

## 2024-04-23 ENCOUNTER — Other Ambulatory Visit: Payer: Self-pay

## 2024-04-23 ENCOUNTER — Encounter (HOSPITAL_COMMUNITY): Payer: Self-pay

## 2024-04-23 ENCOUNTER — Encounter (HOSPITAL_COMMUNITY): Payer: Self-pay | Admitting: *Deleted

## 2024-04-23 VITALS — BP 116/82 | HR 74 | Temp 98.2°F | Resp 18 | Ht 68.0 in | Wt 214.8 lb

## 2024-04-23 DIAGNOSIS — Z01812 Encounter for preprocedural laboratory examination: Secondary | ICD-10-CM | POA: Diagnosis present

## 2024-04-23 DIAGNOSIS — Z01810 Encounter for preprocedural cardiovascular examination: Secondary | ICD-10-CM | POA: Diagnosis present

## 2024-04-23 DIAGNOSIS — I10 Essential (primary) hypertension: Secondary | ICD-10-CM | POA: Diagnosis not present

## 2024-04-23 DIAGNOSIS — I4519 Other right bundle-branch block: Secondary | ICD-10-CM | POA: Diagnosis not present

## 2024-04-23 DIAGNOSIS — Z01818 Encounter for other preprocedural examination: Secondary | ICD-10-CM | POA: Insufficient documentation

## 2024-04-23 LAB — BASIC METABOLIC PANEL WITH GFR
Anion gap: 6 (ref 5–15)
BUN: 11 mg/dL (ref 8–23)
CO2: 27 mmol/L (ref 22–32)
Calcium: 8.7 mg/dL — ABNORMAL LOW (ref 8.9–10.3)
Chloride: 106 mmol/L (ref 98–111)
Creatinine, Ser: 0.81 mg/dL (ref 0.61–1.24)
GFR, Estimated: >60 mL/min (ref >60)
Glucose, Bld: 101 mg/dL — ABNORMAL HIGH (ref 70–99)
Potassium: 4.2 mmol/L (ref 3.5–5.1)
Sodium: 139 mmol/L (ref 135–145)

## 2024-04-23 LAB — CBC
HCT: 39.4 % (ref 39.0–52.0)
Hemoglobin: 12.9 g/dL — ABNORMAL LOW (ref 13.0–17.0)
MCH: 29.7 pg (ref 26.0–34.0)
MCHC: 32.7 g/dL (ref 30.0–36.0)
MCV: 90.6 fL (ref 80.0–100.0)
Platelets: 231 10*3/uL (ref 150–400)
RBC: 4.35 MIL/uL (ref 4.22–5.81)
RDW: 15.1 % (ref 11.5–15.5)
WBC: 3.9 10*3/uL — ABNORMAL LOW (ref 4.0–10.5)
nRBC: 0 % (ref 0.0–0.2)

## 2024-04-23 NOTE — Progress Notes (Signed)
 PCP - Madelyn Schick, NP Cardiologist - denies  PPM/ICD - denies   Chest x-ray - 12/01/05 EKG - 04/23/24 Stress Test - denies ECHO - denies Cardiac Cath - denies  Sleep Study - denies   DM- denies  Last dose of GLP1 agonist-  n/a   ASA/Blood Thinner Instructions: n/a   ERAS Protcol - clears until 0600 PRE-SURGERY Ensure given  COVID TEST- n/a   Anesthesia review: no  Patient denies shortness of breath, fever, cough and chest pain at PAT appointment   All instructions explained to the patient, with a verbal understanding of the material. Patient agrees to go over the instructions while at home for a better understanding.  The opportunity to ask questions was provided.

## 2024-04-29 NOTE — H&P (Signed)
 REFERRING PHYSICIAN: Self PROVIDER: Debi Fall, MD MRN: [id number] DOB: [birth year]  Subjective   Chief Complaint: New Consultation (umbilical hernia w/o obs or gang)  History of Present Illness: Jared Rhodes  is a 73 y.o. male who is seen today as an office consultation for evaluation of New Consultation (umbilical hernia w/o obs or gang)  This is a 73 year old gentleman who is referred here for evaluation of a hernia. He presented to the Emergency Department in late February with constant cramping abdominal pain. It was mostly in the flank. He underwent a CT scan with renal protocol that showed he had a hernia above the umbilicus containing incarcerated small bowel. The bowel was able to be reduced and he was discharged home. He has had a previous history of a robotic prostatectomy for prostate cancer. Since that time, he has had no abdominal pain. He reports he did not even know he had a hernia. He has no current cardiopulmonary issues  Review of Systems: A complete review of systems was obtained from the patient. I have reviewed this information and discussed as appropriate with the patient. See HPI as well for other ROS.  ROS   Medical History: Past Medical History:  Diagnosis Date  Hypertension   There is no problem list on file for this patient.  History reviewed. No pertinent surgical history.   No Known Allergies  Current Outpatient Medications on File Prior to Visit  Medication Sig Dispense Refill  amLODIPine  (NORVASC ) 10 MG tablet Take 1 tablet by mouth once daily  methocarbamoL  (ROBAXIN ) 500 MG tablet Take 500 mg by mouth 2 (two) times daily  naproxen  (NAPROSYN ) 500 MG tablet Take 500 mg by mouth 2 (two) times daily with meals  solifenacin (VESICARE) 5 MG tablet Take 5 mg by mouth once daily   No current facility-administered medications on file prior to visit.   Family History  Problem Relation Age of Onset  High blood pressure (Hypertension)  Brother    Social History   Tobacco Use  Smoking Status Every Day  Types: Cigarettes  Smokeless Tobacco Never    Social History   Socioeconomic History  Marital status: Divorced  Tobacco Use  Smoking status: Every Day  Types: Cigarettes  Smokeless tobacco: Never  Vaping Use  Vaping status: Never Used  Substance and Sexual Activity  Alcohol use: Yes  Drug use: Never   Social Drivers of Corporate investment banker Strain: Low Risk (11/09/2021)  Received from [facility]  Overall Financial Resource Strain (CARDIA)  Difficulty of Paying Living Expenses: Not hard at all  Food Insecurity: Food Insecurity Present (11/09/2021)  Received from [facility]  Hunger Vital Sign  Worried About Running Out of Food in the Last Year: Sometimes true  Ran Out of Food in the Last Year: Never true  Transportation Needs: No Transportation Needs (11/09/2021)  Received from [facility] - Transportation  Lack of Transportation (Medical): No  Lack of Transportation (Non-Medical): No  Physical Activity: Sufficiently Active (11/09/2021)  Received from [facility]  Exercise Vital Sign  Days of Exercise per Week: 7 days  Minutes of Exercise per Session: 60 min  Stress: No Stress Concern Present (11/09/2021)  Received from [facility] - Occupational Stress Questionnaire  Feeling of Stress : Not at all  Social Connections: Moderately Isolated (11/09/2021)  Received from [facility]  Social Connection and Isolation Panel [NHANES]  Frequency of Communication with Friends and Family: More than three times a  week  Frequency of Social Gatherings with Friends and Family: More than three times a week  Attends Religious Services: Never  Database administrator or Organizations: No  Attends Engineer, structural: Never  Marital Status: Living with partner   Objective:   Vitals:   BP: 127/80  Pulse: 75  Temp: 36.8 C (98.2 F)  SpO2:  98%  Weight: 100.3 kg (221 lb 3.2 oz)  Height: 172.7 cm (5\' 8" )  PainSc: 0-No pain  PainLoc: Abdomen   Body mass index is 33.63 kg/m.  Physical Exam   He appears well on exam  His abdomen is soft and obese.  There is a well-healed incision above the umbilicus and just to the right of this is a hernia with a 5 cm fascial defect. The contents are easily reducible and the hernia is nontender  Labs, Imaging and Diagnostic Testing: I reviewed the CT scan of his abdomen and pelvis  Assessment and Plan:   Diagnoses and all orders for this visit:  Incisional hernia, without obstruction or gangrene   I discussed the diagnosis of hernias with the patient. I explained abdominal wall anatomy and what hernias are. He does have a fairly moderate-sized fascial defect measuring 5 cm. The contents are already reduced from the hernia. He is active and still works and I recommend a hernia repair with mesh. I discussed both the laparoscopic and open techniques. We discussed the risk of surgery which includes but is not limited to bleeding, infection, injury to surrounding structures, the use of mesh, hernia recurrence, cardiopulmonary issues with anesthesia, blood clots, postoperative recovery, etc. After discussion we decided to proceed with an open repair with mesh. He agrees with the plans. Surgery will be scheduled

## 2024-04-30 ENCOUNTER — Encounter (HOSPITAL_COMMUNITY)
Admission: RE | Disposition: A | Discharge status: Home / Self Care | Payer: Self-pay | Source: Home / Self Care | Attending: Surgery

## 2024-04-30 ENCOUNTER — Ambulatory Visit (HOSPITAL_BASED_OUTPATIENT_CLINIC_OR_DEPARTMENT_OTHER): Admitting: Anesthesiology

## 2024-04-30 ENCOUNTER — Other Ambulatory Visit: Payer: Self-pay

## 2024-04-30 ENCOUNTER — Ambulatory Visit (HOSPITAL_COMMUNITY): Admitting: Physician Assistant

## 2024-04-30 ENCOUNTER — Ambulatory Visit (HOSPITAL_COMMUNITY)
Admission: RE | Admit: 2024-04-30 | Discharge: 2024-05-01 | Disposition: A | Discharge status: Home / Self Care | Attending: Surgery | Admitting: Surgery

## 2024-04-30 ENCOUNTER — Encounter (HOSPITAL_COMMUNITY): Payer: Self-pay | Admitting: Surgery

## 2024-04-30 DIAGNOSIS — Z6832 Body mass index (BMI) 32.0-32.9, adult: Secondary | ICD-10-CM | POA: Diagnosis not present

## 2024-04-30 DIAGNOSIS — D649 Anemia, unspecified: Secondary | ICD-10-CM | POA: Insufficient documentation

## 2024-04-30 DIAGNOSIS — Z791 Long term (current) use of non-steroidal anti-inflammatories (NSAID): Secondary | ICD-10-CM | POA: Insufficient documentation

## 2024-04-30 DIAGNOSIS — I10 Essential (primary) hypertension: Secondary | ICD-10-CM | POA: Diagnosis not present

## 2024-04-30 DIAGNOSIS — Z8546 Personal history of malignant neoplasm of prostate: Secondary | ICD-10-CM | POA: Insufficient documentation

## 2024-04-30 DIAGNOSIS — Z79899 Other long term (current) drug therapy: Secondary | ICD-10-CM | POA: Insufficient documentation

## 2024-04-30 DIAGNOSIS — K432 Incisional hernia without obstruction or gangrene: Secondary | ICD-10-CM

## 2024-04-30 DIAGNOSIS — E669 Obesity, unspecified: Secondary | ICD-10-CM | POA: Insufficient documentation

## 2024-04-30 DIAGNOSIS — F17210 Nicotine dependence, cigarettes, uncomplicated: Secondary | ICD-10-CM | POA: Diagnosis not present

## 2024-04-30 DIAGNOSIS — M1990 Unspecified osteoarthritis, unspecified site: Secondary | ICD-10-CM | POA: Diagnosis not present

## 2024-04-30 DIAGNOSIS — K430 Incisional hernia with obstruction, without gangrene: Secondary | ICD-10-CM | POA: Diagnosis not present

## 2024-04-30 HISTORY — PX: INSERTION OF MESH: SHX5868

## 2024-04-30 HISTORY — PX: INCISIONAL HERNIA REPAIR: SHX193

## 2024-04-30 SURGERY — REPAIR, HERNIA, INCISIONAL
Anesthesia: General | Site: Abdomen

## 2024-04-30 MED ORDER — DEXAMETHASONE SODIUM PHOSPHATE 10 MG/ML IJ SOLN
INTRAMUSCULAR | Status: DC | PRN
  Administered 2024-04-30: 10 mg via INTRAVENOUS

## 2024-04-30 MED ORDER — MIDAZOLAM HCL 2 MG/2ML IJ SOLN
INTRAMUSCULAR | Status: AC
  Filled 2024-04-30: qty 2

## 2024-04-30 MED ORDER — CIPROFLOXACIN IN D5W 400 MG/200ML IV SOLN
400.0000 mg | INTRAVENOUS | Status: AC
  Administered 2024-04-30: 400 mg via INTRAVENOUS
  Filled 2024-04-30: qty 200

## 2024-04-30 MED ORDER — ONDANSETRON HCL 4 MG/2ML IJ SOLN
INTRAMUSCULAR | Status: AC
  Filled 2024-04-30: qty 2

## 2024-04-30 MED ORDER — CHLORHEXIDINE GLUCONATE 0.12 % MT SOLN
15.0000 mL | Freq: Once | OROMUCOSAL | Status: AC
  Administered 2024-04-30: 15 mL via OROMUCOSAL

## 2024-04-30 MED ORDER — ONDANSETRON 4 MG PO TBDP: 4.0000 mg | ORAL_TABLET | Freq: Four times a day (QID) | ORAL | Status: DC | PRN

## 2024-04-30 MED ORDER — ACETAMINOPHEN 500 MG PO TABS
1000.0000 mg | ORAL_TABLET | Freq: Four times a day (QID) | ORAL | Status: DC
  Administered 2024-04-30 – 2024-05-01 (×3): 1000 mg via ORAL
  Filled 2024-04-30 (×3): qty 2

## 2024-04-30 MED ORDER — EPHEDRINE SULFATE (PRESSORS) 50 MG/ML IJ SOLN
INTRAMUSCULAR | Status: DC | PRN
Start: 2024-04-30 — End: 2024-04-30
  Administered 2024-04-30 (×3): 5 mg via INTRAVENOUS

## 2024-04-30 MED ORDER — FENTANYL CITRATE (PF) 100 MCG/2ML IJ SOLN
INTRAMUSCULAR | Status: AC
  Filled 2024-04-30: qty 2

## 2024-04-30 MED ORDER — PROPOFOL 10 MG/ML IV BOLUS
INTRAVENOUS | Status: AC
  Filled 2024-04-30: qty 20

## 2024-04-30 MED ORDER — KETOROLAC TROMETHAMINE 30 MG/ML IJ SOLN
INTRAMUSCULAR | Status: AC
  Filled 2024-04-30: qty 1

## 2024-04-30 MED ORDER — ACETAMINOPHEN 500 MG PO TABS
1000.0000 mg | ORAL_TABLET | ORAL | Status: AC
  Administered 2024-04-30: 1000 mg via ORAL
  Filled 2024-04-30: qty 2

## 2024-04-30 MED ORDER — ENSURE PRE-SURGERY PO LIQD: 296.0000 mL | Freq: Once | ORAL | Status: DC

## 2024-04-30 MED ORDER — CHLORHEXIDINE GLUCONATE 0.12 % MT SOLN
15.0000 mL | Freq: Once | OROMUCOSAL | Status: DC
  Filled 2024-04-30: qty 15

## 2024-04-30 MED ORDER — BUPIVACAINE-EPINEPHRINE (PF) 0.25% -1:200000 IJ SOLN
INTRAMUSCULAR | Status: AC
  Filled 2024-04-30: qty 30

## 2024-04-30 MED ORDER — SUGAMMADEX SODIUM 200 MG/2ML IV SOLN
INTRAVENOUS | Status: DC | PRN
  Administered 2024-04-30: 200 mg via INTRAVENOUS

## 2024-04-30 MED ORDER — METHOCARBAMOL 500 MG PO TABS: 500.0000 mg | ORAL_TABLET | Freq: Four times a day (QID) | ORAL | Status: DC | PRN

## 2024-04-30 MED ORDER — ORAL CARE MOUTH RINSE: 15.0000 mL | Freq: Once | OROMUCOSAL | Status: DC

## 2024-04-30 MED ORDER — AMLODIPINE BESYLATE 10 MG PO TABS
10.0000 mg | ORAL_TABLET | Freq: Every day | ORAL | Status: DC
  Administered 2024-05-01: 10 mg via ORAL
  Filled 2024-04-30: qty 1

## 2024-04-30 MED ORDER — BUPIVACAINE-EPINEPHRINE 0.25% -1:200000 IJ SOLN
INTRAMUSCULAR | Status: DC | PRN
  Administered 2024-04-30: 20 mL

## 2024-04-30 MED ORDER — LACTATED RINGERS IV SOLN: INTRAVENOUS | Status: DC

## 2024-04-30 MED ORDER — FENTANYL CITRATE (PF) 250 MCG/5ML IJ SOLN
INTRAMUSCULAR | Status: DC | PRN
  Administered 2024-04-30: 100 ug via INTRAVENOUS

## 2024-04-30 MED ORDER — FESOTERODINE FUMARATE ER 4 MG PO TB24
4.0000 mg | ORAL_TABLET | Freq: Every day | ORAL | Status: DC
  Administered 2024-05-01: 4 mg via ORAL
  Filled 2024-04-30: qty 1

## 2024-04-30 MED ORDER — PROPOFOL 10 MG/ML IV BOLUS
INTRAVENOUS | Status: DC | PRN
  Administered 2024-04-30: 140 mg via INTRAVENOUS

## 2024-04-30 MED ORDER — ACETAMINOPHEN 500 MG PO TABS
ORAL_TABLET | ORAL | Status: AC
  Filled 2024-04-30: qty 2

## 2024-04-30 MED ORDER — MIDAZOLAM HCL 5 MG/5ML IJ SOLN
INTRAMUSCULAR | Status: DC | PRN
  Administered 2024-04-30: 2 mg via INTRAVENOUS

## 2024-04-30 MED ORDER — ENOXAPARIN SODIUM 40 MG/0.4ML IJ SOSY
40.0000 mg | PREFILLED_SYRINGE | INTRAMUSCULAR | Status: DC
  Administered 2024-05-01: 40 mg via SUBCUTANEOUS
  Filled 2024-04-30: qty 0.4

## 2024-04-30 MED ORDER — KETOROLAC TROMETHAMINE 15 MG/ML IJ SOLN
INTRAMUSCULAR | Status: DC | PRN
Start: 2024-04-30 — End: 2024-04-30
  Administered 2024-04-30: 15 mg via INTRAVENOUS

## 2024-04-30 MED ORDER — POTASSIUM CHLORIDE IN NACL 20-0.9 MEQ/L-% IV SOLN
INTRAVENOUS | Status: DC
  Filled 2024-04-30: qty 1000

## 2024-04-30 MED ORDER — CHLORHEXIDINE GLUCONATE CLOTH 2 % EX PADS: 6.0000 | MEDICATED_PAD | Freq: Once | CUTANEOUS | Status: DC

## 2024-04-30 MED ORDER — ONDANSETRON HCL 4 MG/2ML IJ SOLN: 4.0000 mg | Freq: Once | INTRAMUSCULAR | Status: DC | PRN

## 2024-04-30 MED ORDER — LIDOCAINE 2% (20 MG/ML) 5 ML SYRINGE
INTRAMUSCULAR | Status: AC
  Filled 2024-04-30: qty 5

## 2024-04-30 MED ORDER — ORAL CARE MOUTH RINSE: 15.0000 mL | Freq: Once | OROMUCOSAL | Status: AC

## 2024-04-30 MED ORDER — TRAMADOL HCL 50 MG PO TABS: 50.0000 mg | ORAL_TABLET | Freq: Four times a day (QID) | ORAL | Status: DC | PRN

## 2024-04-30 MED ORDER — ROCURONIUM BROMIDE 10 MG/ML (PF) SYRINGE
PREFILLED_SYRINGE | INTRAVENOUS | Status: DC | PRN
  Administered 2024-04-30: 60 mg via INTRAVENOUS

## 2024-04-30 MED ORDER — DEXAMETHASONE SODIUM PHOSPHATE 10 MG/ML IJ SOLN
INTRAMUSCULAR | Status: AC
  Filled 2024-04-30: qty 1

## 2024-04-30 MED ORDER — 0.9 % SODIUM CHLORIDE (POUR BTL) OPTIME
TOPICAL | Status: DC | PRN
  Administered 2024-04-30: 1000 mL

## 2024-04-30 MED ORDER — HYDROMORPHONE HCL 1 MG/ML IJ SOLN: 1.0000 mg | INTRAMUSCULAR | Status: DC | PRN

## 2024-04-30 MED ORDER — FENTANYL CITRATE (PF) 100 MCG/2ML IJ SOLN
25.0000 ug | INTRAMUSCULAR | Status: DC | PRN
  Administered 2024-04-30: 25 ug via INTRAVENOUS
  Administered 2024-04-30: 50 ug via INTRAVENOUS

## 2024-04-30 MED ORDER — OXYCODONE HCL 5 MG PO TABS
5.0000 mg | ORAL_TABLET | ORAL | Status: DC | PRN
  Filled 2024-04-30: qty 2

## 2024-04-30 MED ORDER — FENTANYL CITRATE (PF) 250 MCG/5ML IJ SOLN
INTRAMUSCULAR | Status: AC
  Filled 2024-04-30: qty 5

## 2024-04-30 MED ORDER — ONDANSETRON HCL 4 MG/2ML IJ SOLN
INTRAMUSCULAR | Status: DC | PRN
  Administered 2024-04-30: 4 mg via INTRAVENOUS

## 2024-04-30 MED ORDER — PHENYLEPHRINE HCL-NACL 20-0.9 MG/250ML-% IV SOLN
INTRAVENOUS | Status: DC | PRN
  Administered 2024-04-30 (×3): 80 ug via INTRAVENOUS

## 2024-04-30 MED ORDER — ONDANSETRON HCL 4 MG/2ML IJ SOLN: 4.0000 mg | Freq: Four times a day (QID) | INTRAMUSCULAR | Status: DC | PRN

## 2024-04-30 MED ORDER — LIDOCAINE 2% (20 MG/ML) 5 ML SYRINGE
INTRAMUSCULAR | Status: DC | PRN
  Administered 2024-04-30: 100 mg via INTRAVENOUS

## 2024-04-30 SURGICAL SUPPLY — 26 items
CANISTER SUCTION 3000ML PPV (SUCTIONS) ×1 IMPLANT
CHLORAPREP W/TINT 26 (MISCELLANEOUS) ×1 IMPLANT
COVER SURGICAL LIGHT HANDLE (MISCELLANEOUS) ×1 IMPLANT
DERMABOND ADVANCED .7 DNX12 (GAUZE/BANDAGES/DRESSINGS) IMPLANT
DRAPE LAPAROSCOPIC ABDOMINAL (DRAPES) ×1 IMPLANT
ELECTRODE REM PT RTRN 9FT ADLT (ELECTROSURGICAL) ×1 IMPLANT
GLOVE SURG SIGNA 7.5 PF LTX (GLOVE) ×1 IMPLANT
GOWN STRL REUS W/ TWL LRG LVL3 (GOWN DISPOSABLE) ×1 IMPLANT
GOWN STRL REUS W/ TWL XL LVL3 (GOWN DISPOSABLE) ×1 IMPLANT
KIT BASIN OR (CUSTOM PROCEDURE TRAY) ×1 IMPLANT
KIT TURNOVER KIT B (KITS) ×1 IMPLANT
MESH VENTRALEX ST 8CM LRG (Mesh General) IMPLANT
NDL HYPO 25GX1X1/2 BEV (NEEDLE) ×1 IMPLANT
NEEDLE HYPO 25GX1X1/2 BEV (NEEDLE) ×1 IMPLANT
NS IRRIG 1000ML POUR BTL (IV SOLUTION) ×1 IMPLANT
PACK GENERAL/GYN (CUSTOM PROCEDURE TRAY) ×1 IMPLANT
PAD ARMBOARD POSITIONER FOAM (MISCELLANEOUS) ×1 IMPLANT
PENCIL SMOKE EVACUATOR (MISCELLANEOUS) ×1 IMPLANT
SUT MNCRL AB 4-0 PS2 18 (SUTURE) IMPLANT
SUT NOVA NAB DX-16 0-1 5-0 T12 (SUTURE) IMPLANT
SUT PROLENE 1 CT (SUTURE) IMPLANT
SUT VIC AB 3-0 SH 27XBRD (SUTURE) IMPLANT
SYR CONTROL 10ML LL (SYRINGE) IMPLANT
TOWEL GREEN STERILE (TOWEL DISPOSABLE) ×1 IMPLANT
TOWEL GREEN STERILE FF (TOWEL DISPOSABLE) ×1 IMPLANT
TRAY FOLEY MTR SLVR 14FR STAT (SET/KITS/TRAYS/PACK) IMPLANT

## 2024-04-30 NOTE — Anesthesia Preprocedure Evaluation (Addendum)
 Anesthesia Evaluation  Patient identified by MRN, date of birth, ID band Patient awake    Reviewed: Allergy & Precautions, NPO status , Patient's Chart, lab work & pertinent test results  Airway Mallampati: III  TM Distance: >3 FB Neck ROM: Full    Dental  (+) Dental Advisory Given, Missing, Poor Dentition   Pulmonary Current Smoker and Patient abstained from smoking.   Pulmonary exam normal breath sounds clear to auscultation       Cardiovascular hypertension, Pt. on medications Normal cardiovascular exam Rhythm:Regular Rate:Normal     Neuro/Psych negative neurological ROS  negative psych ROS   GI/Hepatic Neg liver ROS,,,Incisional hernia, without obstruction or gangrene    Endo/Other  Obesity   Renal/GU negative Renal ROS   Prostate cancer     Musculoskeletal  (+) Arthritis ,    Abdominal   Peds  Hematology  (+) Blood dyscrasia, anemia   Anesthesia Other Findings Day of surgery medications reviewed with the patient.  Reproductive/Obstetrics                             Anesthesia Physical Anesthesia Plan  ASA: 2  Anesthesia Plan: General   Post-op Pain Management: Tylenol  PO (pre-op)* and Toradol  IV (intra-op)*   Induction: Intravenous  PONV Risk Score and Plan: 2 and Midazolam , Dexamethasone  and Ondansetron   Airway Management Planned: Oral ETT  Additional Equipment:   Intra-op Plan:   Post-operative Plan: Extubation in OR  Informed Consent: I have reviewed the patients History and Physical, chart, labs and discussed the procedure including the risks, benefits and alternatives for the proposed anesthesia with the patient or authorized representative who has indicated his/her understanding and acceptance.     Dental advisory given  Plan Discussed with: CRNA  Anesthesia Plan Comments:        Anesthesia Quick Evaluation

## 2024-04-30 NOTE — Anesthesia Procedure Notes (Signed)
 Procedure Name: Intubation Date/Time: 04/30/2024 8:41 AM  Performed by: Adolphus Hoops, CRNAPre-anesthesia Checklist: Patient identified, Emergency Drugs available, Suction available and Patient being monitored Patient Re-evaluated:Patient Re-evaluated prior to induction Oxygen Delivery Method: Circle System Utilized Preoxygenation: Pre-oxygenation with 100% oxygen Induction Type: IV induction Ventilation: Mask ventilation without difficulty Laryngoscope Size: Mac and 4 Grade View: Grade II Tube type: Oral Tube size: 7.5 mm Number of attempts: 1 Airway Equipment and Method: Stylet and Oral airway Placement Confirmation: ETT inserted through vocal cords under direct vision, positive ETCO2 and breath sounds checked- equal and bilateral Secured at: 23 cm Tube secured with: Tape Dental Injury: Teeth and Oropharynx as per pre-operative assessment

## 2024-04-30 NOTE — Op Note (Signed)
   Jared Rhodes  Jr. 04/30/2024   Pre-op Diagnosis: INCISIONAL HERNIA     Post-op Diagnosis: INCISIONAL HERNIA 4 CM FASCIAL DEFECT  Procedure(s): OPEN INCISIONAL HERNIA REPAIR WITH MESH 8 CM ROUND PROLENE PATCH FROM BARD 4 CM FASCIAL DEFECT  Surgeon(s): [name], MD  Anesthesia: Monitor Anesthesia Care  Staff:  Circulator: Nolen Baumann, RN Relief Circulator: Micky Albee, RN Scrub Person: Perez-Vasquez, Tiffany; Jocelyne Mull, RN  Estimated Blood Loss: Minimal               Indications: This is a 73 year old gentleman who presents with an incisional hernia above the umbilicus from a robot port site from his previous robotic prostatectomy.  He presented earlier this year with incarcerated small bowel which was able to be reduced in the emergency department so now he presents for elective repair of the hernia  Findings: The patient was found to have a 4 cm fascial defect above the umbilicus.  This was again at the robotic port site.  It was repaired with a 8 cm round ventral Prolene patch from Bard  Procedure: The patient was brought to the operating room and identified the correct patient.  He was placed upon the operating table and general anesthesia was induced.  His abdomen was then prepped and draped in usual sterile fashion.  I excised the scar over the top of the hernia from the previous surgery with a scalpel.  I then dissected down to the hernia sac which was easily identified and opened.  All contents had already been reduced.  I excised the sac with the electrocautery.  I grasped the fascial edges with Kocher clamps and elevated it.  The fascial defect was measured with a ruler and was 4 cm round.  An 8 cm round ventral Prolene patch from bar was brought into the field.  I placed it through the fascial opening and then did up against the peritoneum with the ties.  I evaluated all the edges and it appeared to be tolerating at the peritoneum.  I then sutured  the mesh in place circumferentially with #1 Novafil sutures.  Wide coverage of the fascial defect appeared to be achieved.  I cut the stay ties and then closed the fascia over the top of the mesh with figure-of-eight #1 Novafil sutures.  Hemostasis.  To be achieved.  I anesthetized the fascia and surrounding tissue with Marcaine .  I then closed the subcutaneous tissue with interrupted 3-0 Vicryl sutures and closed the skin with a running 4-0 Monocryl.  Dermabond was then applied.  The patient tolerated the procedure well.  All the counts were correct at the end of the procedure.  The patient was then extubated in the operating room and taken in a stable condition to the recovery room.          [name]   Date: 04/30/2024  Time: 9:26 AM

## 2024-04-30 NOTE — Transfer of Care (Signed)
 Immediate Anesthesia Transfer of Care Note  Patient: Jared Mcginley  Jr.  Procedure(s) Performed: REPAIR, HERNIA, INCISIONAL  Patient Location: PACU  Anesthesia Type:General  Level of Consciousness: drowsy  Airway & Oxygen Therapy: Patient Spontanous Breathing and Patient connected to face mask oxygen  Post-op Assessment: Report given to RN and Post -op Vital signs reviewed and stable  Post vital signs: Reviewed and stable  Last Vitals:  Vitals Value Taken Time  BP    Temp    Pulse 71 04/30/24 0936  Resp    SpO2 94 % 04/30/24 0936  Vitals shown include unfiled device data.  Last Pain:  Vitals:   04/30/24 0733  TempSrc:   PainSc: 0-No pain         Complications: No notable events documented.

## 2024-04-30 NOTE — Anesthesia Postprocedure Evaluation (Signed)
 Anesthesia Post Note  Patient: Jared Laredo  Jr.  Procedure(s) Performed: REPAIR, HERNIA, INCISIONAL (Abdomen) INSERTION OF MESH (Abdomen)     Patient location during evaluation: PACU Anesthesia Type: General Level of consciousness: awake and alert Pain management: pain level controlled Vital Signs Assessment: post-procedure vital signs reviewed and stable Respiratory status: spontaneous breathing, nonlabored ventilation and respiratory function stable Cardiovascular status: blood pressure returned to baseline and stable Postop Assessment: no apparent nausea or vomiting Anesthetic complications: no   No notable events documented.  Last Vitals:  Vitals:   04/30/24 1230 04/30/24 1253  BP: 100/70 123/82  Pulse: (!) 58 62  Resp: 15 18  Temp: (!) 36.3 C (!) 36.3 C  SpO2: 95% 99%    Last Pain:  Vitals:   04/30/24 1350  TempSrc:   PainSc: 5                  Erin Havers

## 2024-04-30 NOTE — Interval H&P Note (Signed)
 History and Physical Interval Note:NO CHANGE IN H AND P  04/30/2024 8:07 AM  Jared Rhodes  Jr.  has presented today for surgery, with the diagnosis of INCISIONAL HERNIA.  The various methods of treatment have been discussed with the patient and family. After consideration of risks, benefits and other options for treatment, the patient has consented to  Procedure(s): REPAIR, HERNIA, INCISIONAL (N/A) as a surgical intervention.  The patient's history has been reviewed, patient examined, no change in status, stable for surgery.  I have reviewed the patient's chart and labs.  Questions were answered to the patient's satisfaction.     [name]

## 2024-04-30 NOTE — Plan of Care (Signed)

## 2024-05-01 ENCOUNTER — Other Ambulatory Visit: Payer: Self-pay

## 2024-05-01 ENCOUNTER — Encounter (HOSPITAL_COMMUNITY): Payer: Self-pay | Admitting: Surgery

## 2024-05-01 DIAGNOSIS — I10 Essential (primary) hypertension: Secondary | ICD-10-CM | POA: Diagnosis not present

## 2024-05-01 DIAGNOSIS — Z79899 Other long term (current) drug therapy: Secondary | ICD-10-CM | POA: Diagnosis not present

## 2024-05-01 DIAGNOSIS — M1990 Unspecified osteoarthritis, unspecified site: Secondary | ICD-10-CM | POA: Diagnosis not present

## 2024-05-01 DIAGNOSIS — K432 Incisional hernia without obstruction or gangrene: Secondary | ICD-10-CM | POA: Diagnosis not present

## 2024-05-01 DIAGNOSIS — K430 Incisional hernia with obstruction, without gangrene: Secondary | ICD-10-CM | POA: Diagnosis not present

## 2024-05-01 DIAGNOSIS — Z791 Long term (current) use of non-steroidal anti-inflammatories (NSAID): Secondary | ICD-10-CM | POA: Diagnosis not present

## 2024-05-01 DIAGNOSIS — F17210 Nicotine dependence, cigarettes, uncomplicated: Secondary | ICD-10-CM | POA: Diagnosis not present

## 2024-05-01 DIAGNOSIS — D649 Anemia, unspecified: Secondary | ICD-10-CM | POA: Diagnosis not present

## 2024-05-01 MED ORDER — OXYCODONE HCL 5 MG PO TABS: 5.0000 mg | ORAL_TABLET | Freq: Four times a day (QID) | ORAL | 0 refills | Status: DC | PRN

## 2024-05-01 NOTE — Discharge Instructions (Signed)
 [facility] _______[facility]  UMBILICAL OR INGUINAL HERNIA REPAIR: POST OP INSTRUCTIONS  Always review your discharge instruction sheet given to you by the facility where your surgery was performed. IF YOU HAVE DISABILITY OR FAMILY LEAVE FORMS, YOU MUST BRING THEM TO THE OFFICE FOR PROCESSING.   DO NOT GIVE THEM TO YOUR DOCTOR.  1. A  prescription for pain medication may be given to you upon discharge.  Take your pain medication as prescribed, if needed.  If narcotic pain medicine is not needed, then you may take acetaminophen  (Tylenol ) or ibuprofen  (Advil ) as needed. 2. Take your usually prescribed medications unless otherwise directed. If you need a refill on your pain medication, please contact your pharmacy.  They will contact our office to request authorization. Prescriptions will not be filled after 5 pm or on week-ends. 3. You should follow a light diet the first 24 hours after arrival home, such as soup and crackers, etc.  Be sure to include lots of fluids daily.  Resume your normal diet the day after surgery. 4.Most patients will experience some swelling and bruising around the umbilicus or in the groin and scrotum.  Ice packs and reclining will help.  Swelling and bruising can take several days to resolve.  6. It is common to experience some constipation if taking pain medication after surgery.  Increasing fluid intake and taking a stool softener (such as Colace) will usually help or prevent this problem from occurring.  A mild laxative (Milk of Magnesia or Miralax ) should be taken according to package directions if there are no bowel movements after 48 hours. 7. Unless discharge instructions indicate otherwise, you may remove your bandages 24-48 hours after surgery, and you may shower at that time.  You may have steri-strips (small skin tapes) in place directly over the incision.  These strips should be left on the skin for 7-10 days.  If your surgeon used skin glue on the  incision, you may shower in 24 hours.  The glue will flake off over the next 2-3 weeks.  Any sutures or staples will be removed at the office during your follow-up visit. 8. ACTIVITIES:  You may resume regular (light) daily activities beginning the next day--such as daily self-care, walking, climbing stairs--gradually increasing activities as tolerated.  You may have sexual intercourse when it is comfortable.  Refrain from any heavy lifting or straining until approved by your doctor.  a.You may drive when you are no longer taking prescription pain medication, you can comfortably wear a seatbelt, and you can safely maneuver your car and apply brakes. 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  Make sure that you call for this appointment within a day or two after you arrive home to insure a convenient appointment time. 10.OTHER INSTRUCTIONS: YOU MAY SHOWER STARTING TODAY ICE PACK, TYLENOL , AND IBUPROFEN  ALSO FOR PAIN NO LIFTING MORE THAN 15 POUNDS FOR 6 WEEKS________________________    _____________________________________  WHEN TO CALL YOUR DOCTOR: Fever over 101.0 Inability to urinate Nausea and/or vomiting Extreme swelling or bruising Continued bleeding from incision. Increased pain, redness, or drainage from the incision  The clinic staff is available to answer your questions during regular business hours.  Please don't hesitate to call and ask to speak to one of the nurses for clinical concerns.  If you have a medical emergency,  go to the nearest emergency room or call 911.  A surgeon from [facility] is always on call at the hospital   [address], [address], [place], [place]  [address] ?  [address] [address], [place], [place]   [address] [phone] ? [phone] ? FAX  [phone] Web site: [email or web address]      TO WHOM IT MAY CONCERN:  [name]  had a surgical procedure performed on 04/30/2024  He may return to work on 05/08/24 to light duty with no lifting over 15 pounds.  He may resume normal activity with no restrictions on 06/12/24.  If you have any questions, please have Mr. Jahr  call our office.  Thank you,  [name], MD [facility], a division of [facility]

## 2024-05-01 NOTE — Plan of Care (Signed)

## 2024-05-01 NOTE — Discharge Summary (Signed)
 Physician Discharge Summary  Patient ID: Jared Rhodes  Jared Rhodes. MRN: [id number] DOB/AGE: 02/13/1951 73 y.o.  Admit date: 04/30/2024 Discharge date: 05/01/2024  Admission Diagnoses:  Discharge Diagnoses:  Principal Problem:   Incisional hernia   Discharged Condition: good  Hospital Course: uneventful post op recovery.  Discharged home POD#1 doing well  Consults: None  Significant Diagnostic Studies:   Treatments: surgery: open incisional hernia repair with mesh  Discharge Exam: Blood pressure (!) 141/81, pulse 65, temperature (!) 97.5 F (36.4 C), resp. rate 18, height 5\' 8"  (1.727 m), weight 97.1 kg, SpO2 98%. General appearance: alert, cooperative, and no distress Lungs clear Abdomen soft, incision clean  Disposition: Discharge disposition: 01-Home or Self Care        Allergies as of 05/01/2024       Reactions   Penicillins Rash   Has patient had a PCN reaction causing immediate rash, facial/tongue/throat swelling, SOB or lightheadedness with hypotension: UnknowN Has patient had a PCN reaction causing severe rash involving mucus membranes or skin necrosis: UnknowN Has patient had a PCN reaction that required hospitalization: Unknown Has patient had a PCN reaction occurring within the last 10 years: /Unknown If all of the above answers are "NO", then may proceed with Cephalosporin use.        Medication List     TAKE these medications    acetaminophen  325 MG tablet Commonly known as: TYLENOL  Take 650 mg by mouth every 6 (six) hours as needed for moderate pain.   amLODipine  10 MG tablet Commonly known as: NORVASC  Take 1 tablet (10 mg total) by mouth daily.   oxyCODONE  5 MG immediate release tablet Commonly known as: Oxy IR/ROXICODONE  Take 1 tablet (5 mg total) by mouth every 6 (six) hours as needed for moderate pain (pain score 4-6) or severe pain (pain score 7-10).   solifenacin 5 MG tablet Commonly known as: VESICARE Take 5 mg by mouth daily.         Follow-up Information     [name], MD. Schedule an appointment as soon as possible for a visit on 05/21/2024.   Specialty: General Surgery Contact information: [address] [place] [address] [phone]                 Signed: [name] 05/01/2024, 7:57 AM

## 2024-05-05 ENCOUNTER — Encounter: Admitting: Gastroenterology

## 2024-05-12 ENCOUNTER — Other Ambulatory Visit: Payer: Self-pay

## 2024-05-12 ENCOUNTER — Encounter (HOSPITAL_COMMUNITY): Payer: Self-pay

## 2024-05-12 ENCOUNTER — Emergency Department (HOSPITAL_COMMUNITY)

## 2024-05-12 ENCOUNTER — Inpatient Hospital Stay (HOSPITAL_COMMUNITY)
Admission: EM | Admit: 2024-05-12 | Discharge: 2024-05-14 | DRG: 389 | Disposition: A | Discharge status: Home / Self Care | Attending: Surgery | Admitting: Surgery

## 2024-05-12 DIAGNOSIS — K56609 Unspecified intestinal obstruction, unspecified as to partial versus complete obstruction: Secondary | ICD-10-CM | POA: Diagnosis not present

## 2024-05-12 DIAGNOSIS — Z79899 Other long term (current) drug therapy: Secondary | ICD-10-CM | POA: Diagnosis not present

## 2024-05-12 DIAGNOSIS — Z811 Family history of alcohol abuse and dependence: Secondary | ICD-10-CM

## 2024-05-12 DIAGNOSIS — K5669 Other partial intestinal obstruction: Secondary | ICD-10-CM | POA: Diagnosis not present

## 2024-05-12 DIAGNOSIS — Z8349 Family history of other endocrine, nutritional and metabolic diseases: Secondary | ICD-10-CM

## 2024-05-12 DIAGNOSIS — Z8249 Family history of ischemic heart disease and other diseases of the circulatory system: Secondary | ICD-10-CM

## 2024-05-12 DIAGNOSIS — K56600 Partial intestinal obstruction, unspecified as to cause: Secondary | ICD-10-CM | POA: Diagnosis not present

## 2024-05-12 DIAGNOSIS — Z96641 Presence of right artificial hip joint: Secondary | ICD-10-CM | POA: Diagnosis present

## 2024-05-12 DIAGNOSIS — I10 Essential (primary) hypertension: Secondary | ICD-10-CM | POA: Diagnosis not present

## 2024-05-12 DIAGNOSIS — K91872 Postprocedural seroma of a digestive system organ or structure following a digestive system procedure: Secondary | ICD-10-CM | POA: Diagnosis not present

## 2024-05-12 DIAGNOSIS — K439 Ventral hernia without obstruction or gangrene: Secondary | ICD-10-CM | POA: Diagnosis not present

## 2024-05-12 DIAGNOSIS — Z880 Allergy status to penicillin: Secondary | ICD-10-CM

## 2024-05-12 DIAGNOSIS — F17210 Nicotine dependence, cigarettes, uncomplicated: Secondary | ICD-10-CM | POA: Diagnosis present

## 2024-05-12 DIAGNOSIS — Z8546 Personal history of malignant neoplasm of prostate: Secondary | ICD-10-CM

## 2024-05-12 DIAGNOSIS — R1084 Generalized abdominal pain: Secondary | ICD-10-CM | POA: Diagnosis not present

## 2024-05-12 LAB — LIPASE, BLOOD: Lipase: 34 U/L (ref 11–51)

## 2024-05-12 LAB — COMPREHENSIVE METABOLIC PANEL WITH GFR
ALT: 10 U/L (ref 0–44)
AST: 13 U/L — ABNORMAL LOW (ref 15–41)
Albumin: 3.3 g/dL — ABNORMAL LOW (ref 3.5–5.0)
Alkaline Phosphatase: 47 U/L (ref 38–126)
Anion gap: 9 (ref 5–15)
BUN: 12 mg/dL (ref 8–23)
CO2: 24 mmol/L (ref 22–32)
Calcium: 8.7 mg/dL — ABNORMAL LOW (ref 8.9–10.3)
Chloride: 105 mmol/L (ref 98–111)
Creatinine, Ser: 0.79 mg/dL (ref 0.61–1.24)
GFR, Estimated: >60 mL/min (ref >60)
Glucose, Bld: 117 mg/dL — ABNORMAL HIGH (ref 70–99)
Potassium: 3.9 mmol/L (ref 3.5–5.1)
Sodium: 138 mmol/L (ref 135–145)
Total Bilirubin: 0.6 mg/dL (ref 0.0–1.2)
Total Protein: 7.4 g/dL (ref 6.5–8.1)

## 2024-05-12 LAB — CBC
HCT: 37.9 % — ABNORMAL LOW (ref 39.0–52.0)
Hemoglobin: 12.4 g/dL — ABNORMAL LOW (ref 13.0–17.0)
MCH: 29 pg (ref 26.0–34.0)
MCHC: 32.7 g/dL (ref 30.0–36.0)
MCV: 88.8 fL (ref 80.0–100.0)
Platelets: 322 10*3/uL (ref 150–400)
RBC: 4.27 MIL/uL (ref 4.22–5.81)
RDW: 14.4 % (ref 11.5–15.5)
WBC: 10.1 10*3/uL (ref 4.0–10.5)
nRBC: 0 % (ref 0.0–0.2)

## 2024-05-12 LAB — I-STAT CG4 LACTIC ACID, ED: Lactic Acid, Venous: 1 mmol/L (ref 0.5–1.9)

## 2024-05-12 MED ORDER — MORPHINE SULFATE (PF) 4 MG/ML IV SOLN: 4.0000 mg | Freq: Once | INTRAVENOUS | Status: DC

## 2024-05-12 MED ORDER — LIDOCAINE VISCOUS HCL 2 % MT SOLN
15.0000 mL | Freq: Once | OROMUCOSAL | Status: AC
  Administered 2024-05-12: 15 mL via OROMUCOSAL
  Filled 2024-05-12: qty 15

## 2024-05-12 MED ORDER — IOHEXOL 300 MG/ML  SOLN
100.0000 mL | Freq: Once | INTRAMUSCULAR | Status: AC | PRN
  Administered 2024-05-12: 100 mL via INTRAVENOUS

## 2024-05-12 NOTE — ED Provider Notes (Signed)
 I provided a substantive portion of the care of this patient.  I personally made/approved the management plan for this patient and take responsibility for the patient management.   73 year old male presents with abdominal discomfort.  He has also had nausea and vomiting.  He had a recent hernia repair.  His CT scan shows a close obstruction.  Patient had NG tube ordered general surgery consulted and will see the patient   Lind Repine, MD 05/12/24 2013

## 2024-05-12 NOTE — ED Provider Notes (Signed)
 [place] EMERGENCY DEPARTMENT AT [facility] Provider Note   CSN: [id number] Arrival date & time: 05/12/24  1627     History  Chief Complaint  Patient presents with   Abdominal Pain   Post-op Problem   Emesis    Jared Rhodes  Jared Rhodes. is a 73 y.o. male with past medical history of HTN, prostate cancer (s/p robotic prostatectomy) presents emergency department for evaluation of lower abdominal pain that started today while he was at work. Reports pain was sudden in onset 10/10 but is now currently 3/10. He endorses 5 episodes of vomiting today and 2 episodes yesterday.  Last BM was this morning and normal.  He is status post elective hernia repair on 04/30/2024.  Has been taking oxycodone  for pain with his last dose on 05/05/2024.  Since then, has been taking Tylenol  with his last Tylenol  this morning.  Denies diarrhea, fever  Abdominal Pain Associated symptoms: vomiting   Emesis Associated symptoms: abdominal pain       Home Medications Prior to Admission medications   Medication Sig Start Date End Date Taking? Authorizing Provider  acetaminophen  (TYLENOL ) 325 MG tablet Take 650 mg by mouth every 6 (six) hours as needed for moderate pain.   Yes [provider]  amLODipine  (NORVASC ) 10 MG tablet Take 1 tablet (10 mg total) by mouth daily. 12/09/23  Yes Marius Siemens, NP  oxyCODONE  (OXY IR/ROXICODONE ) 5 MG immediate release tablet Take 1 tablet (5 mg total) by mouth every 6 (six) hours as needed for moderate pain (pain score 4-6) or severe pain (pain score 7-10). 05/01/24  Yes [name], MD  solifenacin (VESICARE) 5 MG tablet Take 5 mg by mouth daily. 02/05/24  Yes [provider]      Allergies    Penicillins    Review of Systems   Review of Systems  Gastrointestinal:  Positive for abdominal pain and vomiting.    Physical Exam Updated Vital Signs BP (!) 135/92 (BP Location: Right Arm)   Pulse 68   Temp 99 F (37.2 C) (Oral)   Resp  15   Ht 5\' 8"  (1.727 m)   Wt 97.1 kg   SpO2 100%   BMI 32.54 kg/m  Physical Exam Vitals and nursing note reviewed.  Constitutional:      General: He is not in acute distress.    Appearance: Normal appearance.  HENT:     Head: Normocephalic and atraumatic.  Eyes:     Conjunctiva/sclera: Conjunctivae normal.  Cardiovascular:     Rate and Rhythm: Normal rate.  Pulmonary:     Effort: Pulmonary effort is normal. No respiratory distress.  Abdominal:     General: A surgical scar is present.     Tenderness: There is generalized abdominal tenderness. There is no guarding.     Comments: Surgical scar from recent hernia repair in periumbillical area. Appears to be healing well with no surrounding erythema, warmth, nor discharge. No peritoneal signs  Skin:    Coloration: Skin is not jaundiced or pale.  Neurological:     Mental Status: He is alert. Mental status is at baseline.     ED Results / Procedures / Treatments   Labs (all labs ordered are listed, but only abnormal results are displayed) Labs Reviewed  COMPREHENSIVE METABOLIC PANEL WITH GFR - Abnormal; Notable for the following components:      Result Value   Glucose, Bld 117 (*)    Calcium 8.7 (*)    Albumin 3.3 (*)  AST 13 (*)    All other components within normal limits  CBC - Abnormal; Notable for the following components:   Hemoglobin 12.4 (*)    HCT 37.9 (*)    All other components within normal limits  LIPASE, BLOOD  I-STAT CG4 LACTIC ACID, ED    EKG None  Radiology CT ABDOMEN PELVIS W CONTRAST Addendum Date: 05/12/2024 ADDENDUM REPORT: 05/12/2024 19:29 ADDENDUM: These results were called by telephone at the time of interpretation on 05/12/2024 at 7:29 pm to provider Paris Bolds , who verbally acknowledged these results. Electronically Signed   By: Tyron Gallon M.D.   On: 05/12/2024 19:29   Result Date: 05/12/2024 CLINICAL DATA:  Acute abdominal pain EXAM: CT ABDOMEN AND PELVIS WITH CONTRAST TECHNIQUE:  Multidetector CT imaging of the abdomen and pelvis was performed using the standard protocol following bolus administration of intravenous contrast. RADIATION DOSE REDUCTION: This exam was performed according to the departmental dose-optimization program which includes automated exposure control, adjustment of the mA and/or kV according to patient size and/or use of iterative reconstruction technique. CONTRAST:  100mL OMNIPAQUE IOHEXOL 300 MG/ML  SOLN COMPARISON:  CT abdomen and pelvis 02/20/2024. FINDINGS: Lower chest: There is linear atelectasis in the lung bases. Hepatobiliary: No focal liver abnormality is seen. No gallstones, gallbladder wall thickening, or biliary dilatation. Pancreas: Unremarkable. No pancreatic ductal dilatation or surrounding inflammatory changes. Spleen: Normal in size without focal abnormality. Adrenals/Urinary Tract: The adrenal glands and kidneys are within normal limits for the bladder is completely decompressed and not well evaluated. Stomach/Bowel: Patient is status post ventral hernia repair. Multiple small bowel loops are clustered adjacent to the anterior abdominal wall. Central small bowel loops are dilated with air-fluid levels and mesenteric edema measuring up to 4.4 cm. Proximal and distal transition points are seen separately in the region of matted small bowel loops. One transition is located on and axial image 2/49 in the other transition is located on axial image 2/54. Small bowel proximal and distal to this level appear decompressed. Colon is nondilated. Stomach is nondilated. There is no pneumatosis or free air. The appendix is not seen. Vascular/Lymphatic: No significant vascular findings are present. No enlarged abdominal or pelvic lymph nodes. Reproductive: Prostate gland not well seen secondary to streak artifact in the pelvis. Other: There is a small amount of free fluid in the pelvis. Patient is status post ventral hernia repair. There is a subcutaneous fluid  collection with air along the right side of the scar/wound measuring 2.9 x 4.6 x 3.4 cm. Musculoskeletal: There are stable compression deformities of the superior endplates of T12,, L2, and L4. Right hip arthroplasty present. IMPRESSION: 1. Small-bowel obstruction with proximal and distal transition points in the region of matted small bowel loops in the anterior abdomen. Findings are concerning for closed loop obstruction. 2. Small amount of free fluid in the pelvis. 3. Subcutaneous fluid collection with air along the right side of the ventral hernia repair scar/wound measuring 2.9 x 4.6 x 3.4 cm. Findings may represent postoperative seroma or abscess. Electronically Signed: By: Tyron Gallon M.D. On: 05/12/2024 19:21    Procedures Procedures    Medications Ordered in ED Medications  iohexol (OMNIPAQUE) 300 MG/ML solution 100 mL (100 mLs Intravenous Contrast Given 05/12/24 1800)  lidocaine  (XYLOCAINE ) 2 % viscous mouth solution 15 mL (15 mLs Mouth/Throat Given 05/12/24 2018)    ED Course/ Medical Decision Making/ A&P  Medical Decision Making Amount and/or Complexity of Data Reviewed Labs: ordered. Radiology: ordered.  Risk Prescription drug management. Decision regarding hospitalization.   Patient presents to the ED for concern of NV, abd pain, this involves an extensive number of treatment options, and is a complaint that carries with it a high risk of complications and morbidity.  The differential diagnosis includes gastroenteritis, obstruction, perforation, appendicitis, diverticulitis, postop abscess/infection   Co morbidities that complicate the patient evaluation  S/P hernia repair on 04/30/2024 from Dr. Lucienne Ryder   Additional history obtained:  Additional history obtained from Nursing and Outside Medical Records   External records from outside source obtained and reviewed including triage RN note, surgical note from 04/30/24   Lab Tests:  I  Ordered, and personally interpreted labs.  The pertinent results include:   CBG 117 Calcium 8.7 Hemoglobin 12.4 (baseline 11.1-13 over the past 68yrs)   Imaging Studies ordered:  I ordered imaging studies including CT abd pelvis  I independently visualized and interpreted imaging which showed  Small-bowel obstruction with proximal and distal transition points in the region of matted small bowel loops in the anterior abdomen. Findings are concerning for closed loop obstruction. Small amount of free fluid in the pelvis. Subcutaneous fluid collection with air along the right side of the ventral hernia repair scar/wound measuring 2.9 x 4.6 x 3.4 cm. Findings may represent postoperative seroma or abscess I agree with the radiologist interpretation      Consultations Obtained:  I requested consultation with general surgery Dr. Jonnie Nettles,  and discussed lab and imaging findings as well as pertinent plan -  Initially recommended NG tube however patient could not tolerate it. Surgery notified NPO No abx at this time They will accept patient for admission See their note   Problem List / ED Course:  Abd pain NV On arrival mild tachycardia of 104bpm. Never had fever while in  ED Lab work grossly unremarkable.  No leukocytosis nor gross anemia. No lactic acidosis Generally tender on exam but no significant debilitating pain. Reports pain has improved since onset at work this morning. At time of HPI, did not want analgesia No nausea nor vomiting while in ED CT abd notable for SBO and concern for closed loop obstruction and possible post op seroma or abscess General surgery consult as noted above. They will admit patient for SBO Last oral intake 5/19 at 2000  Reevaluation:  After the interventions noted above, I reevaluated the patient and found that they have :improved     Dispostion:  After consideration of the diagnostic results and the patients response to treatment, I  feel that the patent would benefit from admission to gen surgery service for further management.   Discussed ED workup, plan with patient who expresses understanding and agrees with plan  Final Clinical Impression(s) / ED Diagnoses Final diagnoses:  Generalized abdominal pain  SBO (small bowel obstruction) [facility])    Rx / DC Orders ED Discharge Orders     None         Royann Cords, PA 05/12/24 2113    Lind Repine, MD 05/13/24 1606

## 2024-05-12 NOTE — H&P (Signed)
 Jared Rhodes  Jared Rhodes. is an 73 y.o. male.   Chief Complaint: Nausea, vomiting abdominal pain HPI: Patient presents for evaluation of nausea, vomiting and abdominal pain.  He is 12 days out from an incisional hernia pair with mesh by Dr. Dr. Lucienne Ryder.  He has had 5 days of nausea and vomiting.  He is still moving his bowels and passing gas though.  Complains of diffuse abdominal pain crampy in nature.  His last bowel movement was this morning.  It was normal.  He still passing gas throughout today and into this evening.  He states he is hungry.  CT was obtained by the EDP which showed a small bowel obstruction with concern for closed-loop obstruction.  There is also small fluid collection where the hernia sac was noted as well.  His white count is 10.  His lactate is normal.  Past Medical History:  Diagnosis Date   Hypertension    Prostate cancer (HCC) 2018   radation 40 treatments     Past Surgical History:  Procedure Laterality Date   COLONOSCOPY     I & D EXTREMITY Left 07/17/2014   Procedure: IRRIGATION AND DEBRIDEMENT EXTREMITY;  Surgeon: Edison Gore, MD;  Location: [facility] OR;  Service: Orthopedics;  Laterality: Left;   I & D EXTREMITY Left 07/19/2014   Procedure: IRRIGATION AND DEBRIDEMENT EXTREMITY;  Surgeon: Edison Gore, MD;  Location: [facility] OR;  Service: Orthopedics;  Laterality: Left;   INCISIONAL HERNIA REPAIR N/A 04/30/2024   Procedure: REPAIR, HERNIA, INCISIONAL;  Surgeon: [name], MD;  Location: [facility] OR;  Service: General;  Laterality: N/A;   INSERTION OF MESH N/A 04/30/2024   Procedure: INSERTION OF MESH;  Surgeon: [name], MD;  Location: [facility] OR;  Service: General;  Laterality: N/A;   LYMPHADENECTOMY Bilateral 09/25/2021   Procedure: Ane Keener, PELVIC;  Surgeon: Florencio Hunting, MD;  Location: [facility] ORS;  Service: Urology;  Laterality: Bilateral;   ORIF ANKLE FRACTURE Left 07/19/2014   Procedure: OPEN REDUCTION INTERNAL FIXATION (ORIF) ANKLE FRACTURE;   Surgeon: Edison Gore, MD;  Location: [facility] OR;  Service: Orthopedics;  Laterality: Left;   PROSTATE BIOPSY     ROBOT ASSISTED LAPAROSCOPIC RADICAL PROSTATECTOMY N/A 09/25/2021   Procedure: XI ROBOTIC ASSISTED LAPAROSCOPIC RADICAL PROSTATECTOMY LEVEL 3;  Surgeon: Florencio Hunting, MD;  Location: [facility] ORS;  Service: Urology;  Laterality: N/A;   TONSILLECTOMY     TOTAL HIP ARTHROPLASTY Right 03/02/2016   Procedure: RIGHT TOTAL HIP ARTHROPLASTY ANTERIOR APPROACH;  Surgeon: Wes Hamman, MD;  Location: [facility] OR;  Service: Orthopedics;  Laterality: Right;    Family History  Problem Relation Age of Onset   Alcohol abuse Mother    Heart disease Sister    Hyperlipidemia Brother    Colon cancer Neg Hx    Breast cancer Neg Hx    Prostate cancer Neg Hx    Pancreatic cancer Neg Hx    Colon polyps Neg Hx    Esophageal cancer Neg Hx    Rectal cancer Neg Hx    Stomach cancer Neg Hx    Social History:  reports that he has been smoking cigarettes. He has a 30 pack-year smoking history. He has never used smokeless tobacco. He reports current alcohol use of about 6.0 standard drinks of alcohol per week. He reports that he does not use drugs.  Allergies:  Allergies  Allergen Reactions   Penicillins Rash    Has patient had a PCN reaction causing immediate rash, facial/tongue/throat swelling, SOB or lightheadedness with  hypotension: UnknowN Has patient had a PCN reaction causing severe rash involving mucus membranes or skin necrosis: UnknowN Has patient had a PCN reaction that required hospitalization: Unknown Has patient had a PCN reaction occurring within the last 10 years: /Unknown If all of the above answers are "NO", then may proceed with Cephalosporin use.     (Not in a hospital admission)   Results for orders placed or performed during the hospital encounter of 05/12/24 (from the past 48 hours)  Lipase, blood     Status: None   Collection Time: 05/12/24  4:36 PM  Result Value Ref Range    Lipase 34 11 - 51 U/L    Comment: Performed at [facility], [address] [place], [place] [address]  Comprehensive metabolic panel     Status: Abnormal   Collection Time: 05/12/24  4:36 PM  Result Value Ref Range   Sodium 138 135 - 145 mmol/L   Potassium 3.9 3.5 - 5.1 mmol/L   Chloride 105 98 - 111 mmol/L   CO2 24 22 - 32 mmol/L   Glucose, Bld 117 (H) 70 - 99 mg/dL    Comment: Glucose reference range applies only to samples taken after fasting for at least 8 hours.   BUN 12 8 - 23 mg/dL   Creatinine, Ser 6.04 0.61 - 1.24 mg/dL   Calcium 8.7 (L) 8.9 - 10.3 mg/dL   Total Protein 7.4 6.5 - 8.1 g/dL   Albumin 3.3 (L) 3.5 - 5.0 g/dL   AST 13 (L) 15 - 41 U/L   ALT 10 0 - 44 U/L   Alkaline Phosphatase 47 38 - 126 U/L   Total Bilirubin 0.6 0.0 - 1.2 mg/dL   GFR, Estimated >54 >09 mL/min    Comment: (NOTE) Calculated using the CKD-EPI Creatinine Equation (2021)    Anion gap 9 5 - 15    Comment: Performed at [facility], [address] [place], [place] [address]  CBC     Status: Abnormal   Collection Time: 05/12/24  4:36 PM  Result Value Ref Range   WBC 10.1 4.0 - 10.5 K/uL   RBC 4.27 4.22 - 5.81 MIL/uL   Hemoglobin 12.4 (L) 13.0 - 17.0 g/dL   HCT 47.8 (L) 29.5 - 62.1 %   MCV 88.8 80.0 - 100.0 fL   MCH 29.0 26.0 - 34.0 pg   MCHC 32.7 30.0 - 36.0 g/dL   RDW 30.8 65.7 - 84.6 %   Platelets 322 150 - 400 K/uL   nRBC 0.0 0.0 - 0.2 %    Comment: Performed at [facility], [address] [place], [place] [address]  I-Stat CG4 Lactic Acid, ED     Status: None   Collection Time: 05/12/24  7:54 PM  Result Value Ref Range   Lactic Acid, Venous 1.0 0.5 - 1.9 mmol/L   CT ABDOMEN PELVIS W CONTRAST Addendum Date: 05/12/2024 ADDENDUM REPORT: 05/12/2024 19:29 ADDENDUM: These results were called by telephone at the time of interpretation on 05/12/2024 at 7:29 pm to provider Paris Bolds , who verbally acknowledged these results.  Electronically Signed   By: Tyron Gallon M.D.   On: 05/12/2024 19:29   Result Date: 05/12/2024 CLINICAL DATA:  Acute abdominal pain EXAM: CT ABDOMEN AND PELVIS WITH CONTRAST TECHNIQUE: Multidetector CT imaging of the abdomen and pelvis was performed using the standard protocol following bolus administration of intravenous contrast. RADIATION DOSE REDUCTION: This exam was performed according to the departmental  dose-optimization program which includes automated exposure control, adjustment of the mA and/or kV according to patient size and/or use of iterative reconstruction technique. CONTRAST:  100mL OMNIPAQUE IOHEXOL 300 MG/ML  SOLN COMPARISON:  CT abdomen and pelvis 02/20/2024. FINDINGS: Lower chest: There is linear atelectasis in the lung bases. Hepatobiliary: No focal liver abnormality is seen. No gallstones, gallbladder wall thickening, or biliary dilatation. Pancreas: Unremarkable. No pancreatic ductal dilatation or surrounding inflammatory changes. Spleen: Normal in size without focal abnormality. Adrenals/Urinary Tract: The adrenal glands and kidneys are within normal limits for the bladder is completely decompressed and not well evaluated. Stomach/Bowel: Patient is status post ventral hernia repair. Multiple small bowel loops are clustered adjacent to the anterior abdominal wall. Central small bowel loops are dilated with air-fluid levels and mesenteric edema measuring up to 4.4 cm. Proximal and distal transition points are seen separately in the region of matted small bowel loops. One transition is located on and axial image 2/49 in the other transition is located on axial image 2/54. Small bowel proximal and distal to this level appear decompressed. Colon is nondilated. Stomach is nondilated. There is no pneumatosis or free air. The appendix is not seen. Vascular/Lymphatic: No significant vascular findings are present. No enlarged abdominal or pelvic lymph nodes. Reproductive: Prostate gland not well  seen secondary to streak artifact in the pelvis. Other: There is a small amount of free fluid in the pelvis. Patient is status post ventral hernia repair. There is a subcutaneous fluid collection with air along the right side of the scar/wound measuring 2.9 x 4.6 x 3.4 cm. Musculoskeletal: There are stable compression deformities of the superior endplates of T12,, L2, and L4. Right hip arthroplasty present. IMPRESSION: 1. Small-bowel obstruction with proximal and distal transition points in the region of matted small bowel loops in the anterior abdomen. Findings are concerning for closed loop obstruction. 2. Small amount of free fluid in the pelvis. 3. Subcutaneous fluid collection with air along the right side of the ventral hernia repair scar/wound measuring 2.9 x 4.6 x 3.4 cm. Findings may represent postoperative seroma or abscess. Electronically Signed: By: Tyron Gallon M.D. On: 05/12/2024 19:21    Review of Systems  Gastrointestinal:  Positive for abdominal pain, nausea and vomiting.  All other systems reviewed and are negative.   Blood pressure 111/72, pulse (!) 104, temperature 98.1 F (36.7 C), temperature source Oral, resp. rate 18, height 5\' 8"  (1.727 m), weight 97.1 kg, SpO2 99%. Physical Exam Cardiovascular:     Rate and Rhythm: Normal rate.  Pulmonary:     Effort: Pulmonary effort is normal.  Abdominal:     General: Abdomen is protuberant. There is distension.     Palpations: Abdomen is soft.     Tenderness: There is generalized abdominal tenderness. There is no guarding or rebound.     Hernia: No hernia is present.       Comments: Incision clean dry intact.  No signs of cellulitis or fluctuance.  Skin:    General: Skin is warm.  Neurological:     General: No focal deficit present.     Mental Status: He is alert.  Psychiatric:        Mood and Affect: Mood normal.      Assessment/Plan Postop day 12 open incisional hernia repair with mesh with Dr. Lucienne Ryder  5 days of  nausea and vomiting.  Still having bowel function though.  CT shows signs consistent with a partial small bowel obstruction.  By exam, he has  no rebound guarding or any tenderness whatsoever.  This would be unusual in the setting of a closed-loop obstruction therefore I think this is less likely.  Fluid collection is more likely a postop seroma which would be expected for this procedure.  Plan for admission, n.p.o. status, IV fluids, repeat films in AM.  He has refused an NG tube at this point in time.  I explained if he has any further nausea or vomiting he may want to reconsider this.  He does not have an acutely surgical abdomen at this point in time but will follow along.  I will let Dr. Lucienne Ryder know he is in the hospital.  Rodrigo [name], MD 05/12/2024, 8:35 PM

## 2024-05-12 NOTE — ED Triage Notes (Signed)
 Pt arrives via POV. PT reports recent hernia repair on the 8th. Pt reports intermittent abdominal pain, nausea, and vomiting for the past two days.

## 2024-05-13 ENCOUNTER — Inpatient Hospital Stay (HOSPITAL_COMMUNITY)

## 2024-05-13 DIAGNOSIS — K91872 Postprocedural seroma of a digestive system organ or structure following a digestive system procedure: Secondary | ICD-10-CM | POA: Diagnosis present

## 2024-05-13 DIAGNOSIS — Z96641 Presence of right artificial hip joint: Secondary | ICD-10-CM | POA: Diagnosis present

## 2024-05-13 DIAGNOSIS — K56609 Unspecified intestinal obstruction, unspecified as to partial versus complete obstruction: Secondary | ICD-10-CM | POA: Diagnosis present

## 2024-05-13 DIAGNOSIS — I10 Essential (primary) hypertension: Secondary | ICD-10-CM | POA: Diagnosis present

## 2024-05-13 DIAGNOSIS — F17210 Nicotine dependence, cigarettes, uncomplicated: Secondary | ICD-10-CM | POA: Diagnosis present

## 2024-05-13 DIAGNOSIS — Z79899 Other long term (current) drug therapy: Secondary | ICD-10-CM | POA: Diagnosis not present

## 2024-05-13 DIAGNOSIS — Z880 Allergy status to penicillin: Secondary | ICD-10-CM | POA: Diagnosis not present

## 2024-05-13 DIAGNOSIS — K56600 Partial intestinal obstruction, unspecified as to cause: Secondary | ICD-10-CM | POA: Diagnosis present

## 2024-05-13 DIAGNOSIS — Z811 Family history of alcohol abuse and dependence: Secondary | ICD-10-CM | POA: Diagnosis not present

## 2024-05-13 DIAGNOSIS — Z8349 Family history of other endocrine, nutritional and metabolic diseases: Secondary | ICD-10-CM | POA: Diagnosis not present

## 2024-05-13 DIAGNOSIS — Z8249 Family history of ischemic heart disease and other diseases of the circulatory system: Secondary | ICD-10-CM | POA: Diagnosis not present

## 2024-05-13 DIAGNOSIS — Z8546 Personal history of malignant neoplasm of prostate: Secondary | ICD-10-CM | POA: Diagnosis not present

## 2024-05-13 DIAGNOSIS — K5669 Other partial intestinal obstruction: Secondary | ICD-10-CM | POA: Diagnosis not present

## 2024-05-13 LAB — COMPREHENSIVE METABOLIC PANEL WITH GFR
ALT: 9 U/L (ref 0–44)
AST: 13 U/L — ABNORMAL LOW (ref 15–41)
Albumin: 2.9 g/dL — ABNORMAL LOW (ref 3.5–5.0)
Alkaline Phosphatase: 40 U/L (ref 38–126)
Anion gap: 11 (ref 5–15)
BUN: 10 mg/dL (ref 8–23)
CO2: 21 mmol/L — ABNORMAL LOW (ref 22–32)
Calcium: 8.6 mg/dL — ABNORMAL LOW (ref 8.9–10.3)
Chloride: 108 mmol/L (ref 98–111)
Creatinine, Ser: 0.59 mg/dL — ABNORMAL LOW (ref 0.61–1.24)
GFR, Estimated: >60 mL/min (ref >60)
Glucose, Bld: 113 mg/dL — ABNORMAL HIGH (ref 70–99)
Potassium: 4 mmol/L (ref 3.5–5.1)
Sodium: 140 mmol/L (ref 135–145)
Total Bilirubin: 0.4 mg/dL (ref 0.0–1.2)
Total Protein: 6.7 g/dL (ref 6.5–8.1)

## 2024-05-13 LAB — CBC
HCT: 37.8 % — ABNORMAL LOW (ref 39.0–52.0)
Hemoglobin: 12.1 g/dL — ABNORMAL LOW (ref 13.0–17.0)
MCH: 29.4 pg (ref 26.0–34.0)
MCHC: 32 g/dL (ref 30.0–36.0)
MCV: 92 fL (ref 80.0–100.0)
Platelets: 285 10*3/uL (ref 150–400)
RBC: 4.11 MIL/uL — ABNORMAL LOW (ref 4.22–5.81)
RDW: 14.6 % (ref 11.5–15.5)
WBC: 6 10*3/uL (ref 4.0–10.5)
nRBC: 0 % (ref 0.0–0.2)

## 2024-05-13 MED ORDER — KCL IN DEXTROSE-NACL 20-5-0.9 MEQ/L-%-% IV SOLN
INTRAVENOUS | Status: DC
  Filled 2024-05-13: qty 1000

## 2024-05-13 MED ORDER — METOPROLOL TARTRATE 5 MG/5ML IV SOLN: 5.0000 mg | Freq: Four times a day (QID) | INTRAVENOUS | Status: DC | PRN

## 2024-05-13 MED ORDER — PROCHLORPERAZINE MALEATE 10 MG PO TABS: 10.0000 mg | ORAL_TABLET | Freq: Four times a day (QID) | ORAL | Status: DC | PRN

## 2024-05-13 MED ORDER — ONDANSETRON 4 MG PO TBDP: 4.0000 mg | ORAL_TABLET | Freq: Four times a day (QID) | ORAL | Status: DC | PRN

## 2024-05-13 MED ORDER — KCL IN DEXTROSE-NACL 20-5-0.9 MEQ/L-%-% IV SOLN
INTRAVENOUS | Status: DC
  Filled 2024-05-13 (×2): qty 1000

## 2024-05-13 MED ORDER — KCL IN DEXTROSE-NACL 20-5-0.9 MEQ/L-%-% IV SOLN
INTRAVENOUS | Status: AC
  Filled 2024-05-13 (×3): qty 1000

## 2024-05-13 MED ORDER — HYDROMORPHONE HCL 1 MG/ML IJ SOLN: 1.0000 mg | INTRAMUSCULAR | Status: DC | PRN

## 2024-05-13 MED ORDER — ENOXAPARIN SODIUM 40 MG/0.4ML IJ SOSY
40.0000 mg | PREFILLED_SYRINGE | INTRAMUSCULAR | Status: DC
  Administered 2024-05-13 – 2024-05-14 (×2): 40 mg via SUBCUTANEOUS
  Filled 2024-05-13 (×2): qty 0.4

## 2024-05-13 MED ORDER — PROCHLORPERAZINE EDISYLATE 10 MG/2ML IJ SOLN: 5.0000 mg | Freq: Four times a day (QID) | INTRAMUSCULAR | Status: DC | PRN

## 2024-05-13 MED ORDER — ONDANSETRON HCL 4 MG/2ML IJ SOLN: 4.0000 mg | Freq: Four times a day (QID) | INTRAMUSCULAR | Status: DC | PRN

## 2024-05-13 NOTE — Progress Notes (Signed)
 Subjective/Chief Complaint: Patient reports no nausea or abdominal pain this morning Passing flatus Getting ready to have a BM   Objective: Vital signs in last 24 hours: Temp:  [97.7 F (36.5 C)-99 F (37.2 C)] 97.7 F (36.5 C) (05/21 0529) Pulse Rate:  [56-104] 56 (05/21 0529) Resp:  [15-18] 16 (05/21 0529) BP: (110-140)/(72-92) 121/77 (05/21 0529) SpO2:  [97 %-100 %] 99 % (05/21 0529) Weight:  [97.1 kg] 97.1 kg (05/20 1633)    Intake/Output from previous day: No intake/output data recorded. Intake/Output this shift: No intake/output data recorded.  Exam: Awake and alert Looks comfortable Abdomen is soft and non-tender/non-distended Palpable seroma at hernia site as expected  Lab Results:  Recent Labs    05/12/24 1636 05/13/24 0445  WBC 10.1 6.0  HGB 12.4* 12.1*  HCT 37.9* 37.8*  PLT 322 285   BMET Recent Labs    05/12/24 1636 05/13/24 0445  NA 138 140  K 3.9 4.0  CL 105 108  CO2 24 21*  GLUCOSE 117* 113*  BUN 12 10  CREATININE 0.79 0.59*  CALCIUM 8.7* 8.6*   PT/INR No results for input(s): "LABPROT", "INR" in the last 72 hours. ABG No results for input(s): "PHART", "HCO3" in the last 72 hours.  Invalid input(s): "PCO2", "PO2"  Studies/Results: DG Abd 1 View Result Date: 05/13/2024 CLINICAL DATA:  Small bowel obstruction EXAM: ABDOMEN - 1 VIEW COMPARISON:  CT 05/12/2024 FINDINGS: Dilated small bowel loops compatible small bowel obstruction, similar to prior CT. No visible free air or organomegaly. IMPRESSION: Stable high-grade small bowel obstruction pattern. Electronically Signed   By: Janeece Mechanic M.D.   On: 05/13/2024 00:36   CT ABDOMEN PELVIS W CONTRAST Addendum Date: 05/12/2024 ADDENDUM REPORT: 05/12/2024 19:29 ADDENDUM: These results were called by telephone at the time of interpretation on 05/12/2024 at 7:29 pm to provider Paris Bolds , who verbally acknowledged these results. Electronically Signed   By: Tyron Gallon M.D.   On:  05/12/2024 19:29   Result Date: 05/12/2024 CLINICAL DATA:  Acute abdominal pain EXAM: CT ABDOMEN AND PELVIS WITH CONTRAST TECHNIQUE: Multidetector CT imaging of the abdomen and pelvis was performed using the standard protocol following bolus administration of intravenous contrast. RADIATION DOSE REDUCTION: This exam was performed according to the departmental dose-optimization program which includes automated exposure control, adjustment of the mA and/or kV according to patient size and/or use of iterative reconstruction technique. CONTRAST:  100mL OMNIPAQUE IOHEXOL 300 MG/ML  SOLN COMPARISON:  CT abdomen and pelvis 02/20/2024. FINDINGS: Lower chest: There is linear atelectasis in the lung bases. Hepatobiliary: No focal liver abnormality is seen. No gallstones, gallbladder wall thickening, or biliary dilatation. Pancreas: Unremarkable. No pancreatic ductal dilatation or surrounding inflammatory changes. Spleen: Normal in size without focal abnormality. Adrenals/Urinary Tract: The adrenal glands and kidneys are within normal limits for the bladder is completely decompressed and not well evaluated. Stomach/Bowel: Patient is status post ventral hernia repair. Multiple small bowel loops are clustered adjacent to the anterior abdominal wall. Central small bowel loops are dilated with air-fluid levels and mesenteric edema measuring up to 4.4 cm. Proximal and distal transition points are seen separately in the region of matted small bowel loops. One transition is located on and axial image 2/49 in the other transition is located on axial image 2/54. Small bowel proximal and distal to this level appear decompressed. Colon is nondilated. Stomach is nondilated. There is no pneumatosis or free air. The appendix is not seen. Vascular/Lymphatic: No significant vascular findings are present.  No enlarged abdominal or pelvic lymph nodes. Reproductive: Prostate gland not well seen secondary to streak artifact in the pelvis. Other:  There is a small amount of free fluid in the pelvis. Patient is status post ventral hernia repair. There is a subcutaneous fluid collection with air along the right side of the scar/wound measuring 2.9 x 4.6 x 3.4 cm. Musculoskeletal: There are stable compression deformities of the superior endplates of T12,, L2, and L4. Right hip arthroplasty present. IMPRESSION: 1. Small-bowel obstruction with proximal and distal transition points in the region of matted small bowel loops in the anterior abdomen. Findings are concerning for closed loop obstruction. 2. Small amount of free fluid in the pelvis. 3. Subcutaneous fluid collection with air along the right side of the ventral hernia repair scar/wound measuring 2.9 x 4.6 x 3.4 cm. Findings may represent postoperative seroma or abscess. Electronically Signed: By: Tyron Gallon M.D. On: 05/12/2024 19:21    Anti-infectives: Anti-infectives (From admission, onward)    None       Assessment/Plan: Post op partial SBO vs ileus  I am worried this is more of a partial SBO.  Clinically, his labs are normal and he looks better than his films. I discussed proceeding to the OR for a diagnostic laparoscopy vs giving him 24 hours to see if this will resolve.  He wants to try one more day as he is feeling better which I feel is reasonable. I will let him try liquids.  If he develops abdominal pain, nausea, or emesis, he will be made NPO again.  He refuses and NG placement   [name] MD 05/13/2024

## 2024-05-13 NOTE — TOC Initial Note (Signed)
 Transition of Care [facility]) - Initial/Assessment Note    Patient Details  Name: Jared Rhodes MRN: [id number] Date of Birth: [birth year]  [facility] CM/SW Contact:    Kathryn Parish, RN Phone Number: 05/13/2024, 3:48 PM  Clinical Narrative:                 CM spoke with patient in the room.; states PTA lives in a house with S.O. Jolinda Necessary [phone]. Uses medicare for transportation to appointments; Verified PCP/insurance; No DME, HH, oxygen or SDOH needs; Patients S.O.'s son with transport home at discharge. No TOC needs identified.  TOC signing off. Please place consult if needs present.  Expected Discharge Plan: Home/Self Care Barriers to Discharge: No Barriers Identified, Continued Medical Work up   Patient Goals and CMS Choice Patient states their goals for this hospitalization and ongoing recovery are:: home   Choice offered to / list presented to : NA      Expected Discharge Plan and Services   Discharge Planning Services: CM Consult Post Acute Care Choice: NA Living arrangements for the past 2 months: Single Family Home                 DME Arranged: N/A DME Agency: NA       HH Arranged: NA HH Agency: NA        Prior Living Arrangements/Services Living arrangements for the past 2 months: Single Family Home Lives with:: Spouse Patient language and need for interpreter reviewed:: Yes Do you feel safe going back to the place where you live?: Yes      Need for Family Participation in Patient Care: Yes (Comment) Care giver support system in place?: Yes (comment) Current home services:  (NA) Criminal Activity/Legal Involvement Pertinent to Current Situation/Hospitalization: No - Comment as needed  Activities of Daily Living   ADL Screening (condition at time of admission) Independently performs ADLs?: Yes (appropriate for developmental age) Is the patient deaf or have difficulty hearing?: No Does the patient have difficulty  seeing, even when wearing glasses/contacts?: No Does the patient have difficulty concentrating, remembering, or making decisions?: No  Permission Sought/Granted Permission sought to share information with : Case Manager Permission granted to share information with : Yes, Verbal Permission Granted  Share Information with NAME: Blanca Bunch     Permission granted to share info w Relationship: SO  Permission granted to share info w Contact Information: [phone]  Emotional Assessment Appearance:: Appears stated age Attitude/Demeanor/Rapport: Gracious Affect (typically observed): Appropriate Orientation: : Oriented to Self, Oriented to Place, Oriented to  Time, Oriented to Situation Alcohol / Substance Use: Not Applicable Psych Involvement: No (comment)  Admission diagnosis:  SBO (small bowel obstruction) (HCC) [K56.609] Generalized abdominal pain [R10.84] Patient Active Problem List   Diagnosis Date Noted   SBO (small bowel obstruction) (HCC) 05/13/2024   Incisional hernia 04/30/2024   Prostate cancer (HCC) 02/01/2017   Osteoarthritis of right hip 03/02/2016   Hip joint replacement status 03/02/2016   Alcohol intoxication (HCC) 07/17/2014   Type III open fracture dislocation of left ankle joint 07/17/2014   HTN (hypertension) 04/17/2014   Severe obesity (BMI >= 40) (HCC) 04/17/2014   PCP:  Marius Siemens, NP Pharmacy:   [id number] [place], [place] - [address] AT [facility] [address] [place] [address] Phone: [phone] Fax: [phone]  [facility] - [place], [place] - [address] [place] [address] [place] [address] Phone:  [phone] Fax: [phone]     Social Drivers of Health (SDOH) Social History: SDOH Screenings   Food Insecurity: No Food Insecurity (05/13/2024)  Housing: Low Risk  (05/13/2024)  Transportation Needs: No Transportation Needs (05/13/2024)  Utilities: Not At Risk (05/13/2024)  Alcohol Screen:  Low Risk  (11/09/2021)  Depression (PHQ2-9): Low Risk  (12/09/2023)  Financial Resource Strain: Low Risk  (11/09/2021)  Physical Activity: Sufficiently Active (11/09/2021)  Social Connections: Socially Isolated (05/13/2024)  Stress: No Stress Concern Present (11/09/2021)  Tobacco Use: High Risk (05/12/2024)   SDOH Interventions:     Readmission Risk Interventions    05/13/2024    3:44 PM  Readmission Risk Prevention Plan  Transportation Screening Complete  PCP or Specialist Appt within 5-7 Days Complete  Home Care Screening Complete  Medication Review (RN CM) Complete

## 2024-05-13 NOTE — Plan of Care (Signed)

## 2024-05-13 NOTE — Progress Notes (Signed)
 Patient ID: Jared Rhodes  [name]., male   DOB: 1951/06/17, 73 y.o.   MRN: [id number]   Pt advanced to full liquids. He reports no abdominal pain and no nausea He has had multiple bm's On exam, his abdomen is soft and non-tender  Plan: Continue full liquids and IV fluids for now

## 2024-05-14 ENCOUNTER — Inpatient Hospital Stay (HOSPITAL_COMMUNITY)

## 2024-05-14 NOTE — Discharge Instructions (Signed)
 Keep appointment on the 29th  Call for any worsening pain or nausea and vomiting

## 2024-05-14 NOTE — Progress Notes (Signed)
 Subjective/Chief Complaint: He feels well and has had multiple bms Reports no nausea or emesis for 48 hours   Objective: Vital signs in last 24 hours: Temp:  [98 F (36.7 C)-98.2 F (36.8 C)] 98 F (36.7 C) (05/22 0439) Pulse Rate:  [66-68] 68 (05/22 0439) Resp:  [18] 18 (05/22 0439) BP: (122-127)/(86-91) 127/86 (05/22 0439) SpO2:  [99 %] 99 % (05/22 0439) Last BM Date : 05/14/24  Intake/Output from previous day: 05/21 0701 - 05/22 0700 In: 854.7 [P.O.:360; I.V.:494.7] Out: -  Intake/Output this shift: Total I/O In: 240 [P.O.:240] Out: -   Exam: Awake and alert Walking in the hall Abdomen soft, seroma at incision, no tenderness or distension  Lab Results:  Recent Labs    05/12/24 1636 05/13/24 0445  WBC 10.1 6.0  HGB 12.4* 12.1*  HCT 37.9* 37.8*  PLT 322 285   BMET Recent Labs    05/12/24 1636 05/13/24 0445  NA 138 140  K 3.9 4.0  CL 105 108  CO2 24 21*  GLUCOSE 117* 113*  BUN 12 10  CREATININE 0.79 0.59*  CALCIUM 8.7* 8.6*   PT/INR No results for input(s): "LABPROT", "INR" in the last 72 hours. ABG No results for input(s): "PHART", "HCO3" in the last 72 hours.  Invalid input(s): "PCO2", "PO2"  Studies/Results: DG Abd Portable 1V Result Date: 05/14/2024 CLINICAL DATA:  Small bowel obstruction. EXAM: PORTABLE ABDOMEN - 1 VIEW COMPARISON:  May 13, 2024. FINDINGS: Mildly improved small bowel dilatation is noted. No colonic dilatation is noted. IMPRESSION: Mildly improved small bowel dilatation is noted suggesting improving obstruction or ileus. Electronically Signed   By: Rosalene Colon M.D.   On: 05/14/2024 08:18   DG Abd 1 View Result Date: 05/13/2024 CLINICAL DATA:  Small bowel obstruction EXAM: ABDOMEN - 1 VIEW COMPARISON:  CT 05/12/2024 FINDINGS: Dilated small bowel loops compatible small bowel obstruction, similar to prior CT. No visible free air or organomegaly. IMPRESSION: Stable high-grade small bowel obstruction pattern. Electronically  Signed   By: Janeece Mechanic M.D.   On: 05/13/2024 00:36   CT ABDOMEN PELVIS W CONTRAST Addendum Date: 05/12/2024 ADDENDUM REPORT: 05/12/2024 19:29 ADDENDUM: These results were called by telephone at the time of interpretation on 05/12/2024 at 7:29 pm to provider Paris Bolds , who verbally acknowledged these results. Electronically Signed   By: Tyron Gallon M.D.   On: 05/12/2024 19:29   Result Date: 05/12/2024 CLINICAL DATA:  Acute abdominal pain EXAM: CT ABDOMEN AND PELVIS WITH CONTRAST TECHNIQUE: Multidetector CT imaging of the abdomen and pelvis was performed using the standard protocol following bolus administration of intravenous contrast. RADIATION DOSE REDUCTION: This exam was performed according to the departmental dose-optimization program which includes automated exposure control, adjustment of the mA and/or kV according to patient size and/or use of iterative reconstruction technique. CONTRAST:  100mL OMNIPAQUE IOHEXOL 300 MG/ML  SOLN COMPARISON:  CT abdomen and pelvis 02/20/2024. FINDINGS: Lower chest: There is linear atelectasis in the lung bases. Hepatobiliary: No focal liver abnormality is seen. No gallstones, gallbladder wall thickening, or biliary dilatation. Pancreas: Unremarkable. No pancreatic ductal dilatation or surrounding inflammatory changes. Spleen: Normal in size without focal abnormality. Adrenals/Urinary Tract: The adrenal glands and kidneys are within normal limits for the bladder is completely decompressed and not well evaluated. Stomach/Bowel: Patient is status post ventral hernia repair. Multiple small bowel loops are clustered adjacent to the anterior abdominal wall. Central small bowel loops are dilated with air-fluid levels and mesenteric edema measuring up to 4.4  cm. Proximal and distal transition points are seen separately in the region of matted small bowel loops. One transition is located on and axial image 2/49 in the other transition is located on axial image 2/54. Small  bowel proximal and distal to this level appear decompressed. Colon is nondilated. Stomach is nondilated. There is no pneumatosis or free air. The appendix is not seen. Vascular/Lymphatic: No significant vascular findings are present. No enlarged abdominal or pelvic lymph nodes. Reproductive: Prostate gland not well seen secondary to streak artifact in the pelvis. Other: There is a small amount of free fluid in the pelvis. Patient is status post ventral hernia repair. There is a subcutaneous fluid collection with air along the right side of the scar/wound measuring 2.9 x 4.6 x 3.4 cm. Musculoskeletal: There are stable compression deformities of the superior endplates of T12,, L2, and L4. Right hip arthroplasty present. IMPRESSION: 1. Small-bowel obstruction with proximal and distal transition points in the region of matted small bowel loops in the anterior abdomen. Findings are concerning for closed loop obstruction. 2. Small amount of free fluid in the pelvis. 3. Subcutaneous fluid collection with air along the right side of the ventral hernia repair scar/wound measuring 2.9 x 4.6 x 3.4 cm. Findings may represent postoperative seroma or abscess. Electronically Signed: By: Tyron Gallon M.D. On: 05/12/2024 19:21    Anti-infectives: Anti-infectives (From admission, onward)    None       Assessment/Plan: Resolved psbo vs ileus  Clinically he is much better and wants to go home.  As he is having BMs and has a benign abdomen on exam, will discharge home  LOS: 1 day    [name] MD 05/14/2024

## 2024-05-14 NOTE — Discharge Summary (Signed)
 Physician Discharge Summary  Patient ID: Jared Rhodes. MRN: [id number] DOB/AGE: [birth year] 73 y.o.  Admit date: 05/12/2024 Discharge date: 05/14/2024  Admission Diagnoses:  Discharge Diagnoses:  Principal Problem:   SBO (small bowel obstruction) [facility])   Discharged Condition: good  Hospital Course: admitted with post op sbo vs ileus.  Quickly improved with IV fluids.  Began having bms and had no pain, nausea, or emesis for 48 hours so the decision was made to discharge home  Consults: None  Significant Diagnostic Studies:   Treatments: IV hydration  Discharge Exam: Blood pressure 127/86, pulse 68, temperature 98 F (36.7 C), temperature source Oral, resp. rate 18, height 5\' 8"  (1.727 m), weight 97.1 kg, SpO2 99%. Appears well on exam Abdomen soft, non-tender, non distended, no guarding  Disposition: Discharge disposition: 01-Home or Self Care        Allergies as of 05/14/2024       Reactions   Penicillins Rash   Has patient had a PCN reaction causing immediate rash, facial/tongue/throat swelling, SOB or lightheadedness with hypotension: UnknowN Has patient had a PCN reaction causing severe rash involving mucus membranes or skin necrosis: UnknowN Has patient had a PCN reaction that required hospitalization: Unknown Has patient had a PCN reaction occurring within the last 10 years: /Unknown If all of the above answers are "NO", then may proceed with Cephalosporin use.        Medication List     TAKE these medications    acetaminophen  325 MG tablet Commonly known as: TYLENOL  Take 650 mg by mouth every 6 (six) hours as needed for moderate pain.   amLODipine  10 MG tablet Commonly known as: NORVASC  Take 1 tablet (10 mg total) by mouth daily.   oxyCODONE  5 MG immediate release tablet Commonly known as: Oxy IR/ROXICODONE  Take 1 tablet (5 mg total) by mouth every 6 (six) hours as needed for moderate pain (pain score 4-6) or severe pain (pain score 7-10).    solifenacin 5 MG tablet Commonly known as: VESICARE Take 5 mg by mouth daily.        Follow-up Information     [name], MD Follow up.   Specialty: General Surgery Why: call the office for a post op appointment Contact information: [address] [place] [address] [phone]                 Signed: [name] 05/14/2024, 1:33 PM

## 2024-05-14 NOTE — Progress Notes (Signed)
 Patient discharged to home. Discharge instructions reviewed and questions answered. IV removed, pt tolerated well. F/U appt discussed. Belongings with patient.

## 2024-05-15 ENCOUNTER — Telehealth (INDEPENDENT_AMBULATORY_CARE_PROVIDER_SITE_OTHER): Payer: Self-pay

## 2024-05-15 NOTE — Transitions of Care (Post Inpatient/ED Visit) (Unsigned)
   05/15/2024  Name: Jared Rhodes. MRN: [id number] DOB: 11/21/1951  Today's TOC FU Call Status: Today's TOC FU Call Status:: Unsuccessful Call (1st Attempt) Unsuccessful Call (1st Attempt) Date: 05/15/24  Attempted to reach the patient regarding the most recent Inpatient/ED visit.  Follow Up Plan: Additional outreach attempts will be made to reach the patient to complete the Transitions of Care (Post Inpatient/ED visit) call.   Signature Darrall Ellison, LPN [facility] Nurse Health Advisor Direct Dial [phone]

## 2024-05-19 NOTE — Transitions of Care (Post Inpatient/ED Visit) (Unsigned)
   05/19/2024  Name: Jared Rhodes. MRN: [id number] DOB: 03-Oct-1951  Today's TOC FU Call Status: Today's TOC FU Call Status:: Unsuccessful Call (2nd Attempt) Unsuccessful Call (1st Attempt) Date: 05/15/24 Unsuccessful Call (2nd Attempt) Date: 05/19/24  Attempted to reach the patient regarding the most recent Inpatient/ED visit.  Follow Up Plan: Additional outreach attempts will be made to reach the patient to complete the Transitions of Care (Post Inpatient/ED visit) call.   Signature Darrall Ellison, LPN [facility] Nurse Health Advisor Direct Dial [phone]

## 2024-05-20 ENCOUNTER — Inpatient Hospital Stay (HOSPITAL_COMMUNITY)
Admission: EM | Admit: 2024-05-20 | Discharge: 2024-05-24 | DRG: 390 | Disposition: A | Discharge status: Home / Self Care | Attending: General Surgery | Admitting: General Surgery

## 2024-05-20 ENCOUNTER — Other Ambulatory Visit: Payer: Self-pay

## 2024-05-20 ENCOUNTER — Inpatient Hospital Stay (HOSPITAL_COMMUNITY)

## 2024-05-20 ENCOUNTER — Encounter (HOSPITAL_COMMUNITY): Payer: Self-pay

## 2024-05-20 ENCOUNTER — Emergency Department (HOSPITAL_COMMUNITY)

## 2024-05-20 DIAGNOSIS — Z8546 Personal history of malignant neoplasm of prostate: Secondary | ICD-10-CM

## 2024-05-20 DIAGNOSIS — Z8249 Family history of ischemic heart disease and other diseases of the circulatory system: Secondary | ICD-10-CM

## 2024-05-20 DIAGNOSIS — Z9079 Acquired absence of other genital organ(s): Secondary | ICD-10-CM

## 2024-05-20 DIAGNOSIS — Z923 Personal history of irradiation: Secondary | ICD-10-CM

## 2024-05-20 DIAGNOSIS — K56609 Unspecified intestinal obstruction, unspecified as to partial versus complete obstruction: Secondary | ICD-10-CM | POA: Diagnosis present

## 2024-05-20 DIAGNOSIS — I10 Essential (primary) hypertension: Secondary | ICD-10-CM | POA: Diagnosis present

## 2024-05-20 DIAGNOSIS — K5669 Other partial intestinal obstruction: Secondary | ICD-10-CM | POA: Diagnosis not present

## 2024-05-20 DIAGNOSIS — Z79899 Other long term (current) drug therapy: Secondary | ICD-10-CM | POA: Diagnosis not present

## 2024-05-20 DIAGNOSIS — Z96641 Presence of right artificial hip joint: Secondary | ICD-10-CM | POA: Diagnosis present

## 2024-05-20 DIAGNOSIS — F17210 Nicotine dependence, cigarettes, uncomplicated: Secondary | ICD-10-CM | POA: Diagnosis present

## 2024-05-20 DIAGNOSIS — N3289 Other specified disorders of bladder: Secondary | ICD-10-CM | POA: Diagnosis not present

## 2024-05-20 DIAGNOSIS — Z4682 Encounter for fitting and adjustment of non-vascular catheter: Secondary | ICD-10-CM | POA: Diagnosis not present

## 2024-05-20 LAB — CBC
HCT: 41.6 % (ref 39.0–52.0)
Hemoglobin: 13.3 g/dL (ref 13.0–17.0)
MCH: 28.8 pg (ref 26.0–34.0)
MCHC: 32 g/dL (ref 30.0–36.0)
MCV: 90 fL (ref 80.0–100.0)
Platelets: 377 10*3/uL (ref 150–400)
RBC: 4.62 MIL/uL (ref 4.22–5.81)
RDW: 14.5 % (ref 11.5–15.5)
WBC: 6.2 10*3/uL (ref 4.0–10.5)
nRBC: 0 % (ref 0.0–0.2)

## 2024-05-20 LAB — LIPASE, BLOOD: Lipase: 31 U/L (ref 11–51)

## 2024-05-20 LAB — COMPREHENSIVE METABOLIC PANEL WITH GFR
ALT: 16 U/L (ref 0–44)
AST: 16 U/L (ref 15–41)
Albumin: 3.6 g/dL (ref 3.5–5.0)
Alkaline Phosphatase: 53 U/L (ref 38–126)
Anion gap: 9 (ref 5–15)
BUN: 13 mg/dL (ref 8–23)
CO2: 28 mmol/L (ref 22–32)
Calcium: 9.1 mg/dL (ref 8.9–10.3)
Chloride: 102 mmol/L (ref 98–111)
Creatinine, Ser: 0.85 mg/dL (ref 0.61–1.24)
GFR, Estimated: >60 mL/min (ref >60)
Glucose, Bld: 120 mg/dL — ABNORMAL HIGH (ref 70–99)
Potassium: 3.7 mmol/L (ref 3.5–5.1)
Sodium: 139 mmol/L (ref 135–145)
Total Bilirubin: 0.2 mg/dL (ref 0.0–1.2)
Total Protein: 8.4 g/dL — ABNORMAL HIGH (ref 6.5–8.1)

## 2024-05-20 MED ORDER — IOHEXOL 300 MG/ML  SOLN
100.0000 mL | Freq: Once | INTRAMUSCULAR | Status: AC | PRN
  Administered 2024-05-20: 100 mL via INTRAVENOUS

## 2024-05-20 MED ORDER — ACETAMINOPHEN 325 MG PO TABS: 650.0000 mg | ORAL_TABLET | Freq: Four times a day (QID) | ORAL | Status: DC | PRN

## 2024-05-20 MED ORDER — HYDROMORPHONE HCL 1 MG/ML IJ SOLN
0.5000 mg | INTRAMUSCULAR | Status: DC | PRN
  Filled 2024-05-20: qty 1

## 2024-05-20 MED ORDER — ENOXAPARIN SODIUM 40 MG/0.4ML IJ SOSY
40.0000 mg | PREFILLED_SYRINGE | INTRAMUSCULAR | Status: DC
  Administered 2024-05-20 – 2024-05-24 (×5): 40 mg via SUBCUTANEOUS
  Filled 2024-05-20 (×6): qty 0.4

## 2024-05-20 MED ORDER — FENTANYL CITRATE PF 50 MCG/ML IJ SOSY: 50.0000 ug | PREFILLED_SYRINGE | Freq: Once | INTRAMUSCULAR | Status: DC

## 2024-05-20 MED ORDER — ONDANSETRON HCL 4 MG/2ML IJ SOLN
4.0000 mg | Freq: Four times a day (QID) | INTRAMUSCULAR | Status: DC | PRN
  Administered 2024-05-20 – 2024-05-21 (×2): 4 mg via INTRAVENOUS
  Filled 2024-05-20 (×2): qty 2

## 2024-05-20 MED ORDER — METHOCARBAMOL 1000 MG/10ML IJ SOLN: 500.0000 mg | Freq: Three times a day (TID) | INTRAMUSCULAR | Status: DC | PRN

## 2024-05-20 MED ORDER — CARMEX CLASSIC LIP BALM EX OINT
TOPICAL_OINTMENT | CUTANEOUS | Status: DC | PRN
  Filled 2024-05-20: qty 10

## 2024-05-20 MED ORDER — ACETAMINOPHEN 650 MG RE SUPP: 650.0000 mg | Freq: Four times a day (QID) | RECTAL | Status: DC | PRN

## 2024-05-20 MED ORDER — METOPROLOL TARTRATE 5 MG/5ML IV SOLN: 5.0000 mg | Freq: Four times a day (QID) | INTRAVENOUS | Status: DC | PRN

## 2024-05-20 MED ORDER — PHENOL 1.4 % MT LIQD
2.0000 | OROMUCOSAL | Status: DC | PRN
  Administered 2024-05-20: 2 via OROMUCOSAL
  Filled 2024-05-20: qty 177

## 2024-05-20 MED ORDER — ONDANSETRON HCL 4 MG/2ML IJ SOLN
4.0000 mg | Freq: Once | INTRAMUSCULAR | Status: AC
Start: 2024-05-20 — End: 2024-05-20
  Administered 2024-05-20: 4 mg via INTRAVENOUS
  Filled 2024-05-20: qty 2

## 2024-05-20 MED ORDER — METHOCARBAMOL 500 MG PO TABS: 500.0000 mg | ORAL_TABLET | Freq: Three times a day (TID) | ORAL | Status: DC | PRN

## 2024-05-20 MED ORDER — LIDOCAINE HCL URETHRAL/MUCOSAL 2 % EX GEL
1.0000 | Freq: Once | CUTANEOUS | Status: AC
  Administered 2024-05-20: 1 via TOPICAL
  Filled 2024-05-20: qty 11
  Filled 2024-05-20: qty 5

## 2024-05-20 MED ORDER — SODIUM CHLORIDE (PF) 0.9 % IJ SOLN
INTRAMUSCULAR | Status: AC
  Filled 2024-05-20: qty 50

## 2024-05-20 MED ORDER — ONDANSETRON 4 MG PO TBDP: 4.0000 mg | ORAL_TABLET | Freq: Four times a day (QID) | ORAL | Status: DC | PRN

## 2024-05-20 MED ORDER — LACTATED RINGERS IV SOLN
INTRAVENOUS | Status: DC
Start: 2024-05-20 — End: 2024-05-21

## 2024-05-20 NOTE — Progress Notes (Signed)
 Inserted # 16 Fr [place] sump into pt's R nare w return of 400 cc's coffee ground material. Awaiting xray for placement verification, tol well.

## 2024-05-20 NOTE — ED Notes (Signed)
 Pt unable to provide urine at this time

## 2024-05-20 NOTE — H&P (Addendum)
 Jared Bunt  Jr. 1951/04/23  [id number].    Requesting MD: Val Garin, MD Chief Complaint/Reason for Consult: SBO   HPI:  Jared Rhodes  is a 73 y/o M with PMH HTN who recently underwent incisional hernia repair with mesh by Dr. Lucienne Ryder on 04/30/24. His post-operative course was complicated by early post-operative pSBO vs requiring re-admission. His bowel function returned with bowel rest and non-operative measures and he was discharged home on 5/22. He says he was doing ok at home for a few days but, after eating pork chops, corn, and mashed potatoes last night for dinner, he woke up with morning with abdominal pain, distention, and vomiting. Denies a BM today. Last BM was yesterday and was normal. Denies fever or chills. He has been working light duty but called in sick today.  ROS: Review of Systems  All other systems reviewed and are negative.   Family History  Problem Relation Age of Onset   Alcohol abuse Mother    Heart disease Sister    Hyperlipidemia Brother    Colon cancer Neg Hx    Breast cancer Neg Hx    Prostate cancer Neg Hx    Pancreatic cancer Neg Hx    Colon polyps Neg Hx    Esophageal cancer Neg Hx    Rectal cancer Neg Hx    Stomach cancer Neg Hx     Past Medical History:  Diagnosis Date   Hypertension    Prostate cancer (HCC) 2018   radation 40 treatments     Past Surgical History:  Procedure Laterality Date   COLONOSCOPY     I & D EXTREMITY Left 07/17/2014   Procedure: IRRIGATION AND DEBRIDEMENT EXTREMITY;  Surgeon: Edison Gore, MD;  Location: [facility] OR;  Service: Orthopedics;  Laterality: Left;   I & D EXTREMITY Left 07/19/2014   Procedure: IRRIGATION AND DEBRIDEMENT EXTREMITY;  Surgeon: Edison Gore, MD;  Location: [facility] OR;  Service: Orthopedics;  Laterality: Left;   INCISIONAL HERNIA REPAIR N/A 04/30/2024   Procedure: REPAIR, HERNIA, INCISIONAL;  Surgeon: [name], MD;  Location: [facility] OR;  Service: General;  Laterality:  N/A;   INSERTION OF MESH N/A 04/30/2024   Procedure: INSERTION OF MESH;  Surgeon: [name], MD;  Location: [facility] OR;  Service: General;  Laterality: N/A;   LYMPHADENECTOMY Bilateral 09/25/2021   Procedure: Ane Keener, PELVIC;  Surgeon: Florencio Hunting, MD;  Location: [facility] ORS;  Service: Urology;  Laterality: Bilateral;   ORIF ANKLE FRACTURE Left 07/19/2014   Procedure: OPEN REDUCTION INTERNAL FIXATION (ORIF) ANKLE FRACTURE;  Surgeon: Edison Gore, MD;  Location: [facility] OR;  Service: Orthopedics;  Laterality: Left;   PROSTATE BIOPSY     ROBOT ASSISTED LAPAROSCOPIC RADICAL PROSTATECTOMY N/A 09/25/2021   Procedure: XI ROBOTIC ASSISTED LAPAROSCOPIC RADICAL PROSTATECTOMY LEVEL 3;  Surgeon: Florencio Hunting, MD;  Location: [facility] ORS;  Service: Urology;  Laterality: N/A;   TONSILLECTOMY     TOTAL HIP ARTHROPLASTY Right 03/02/2016   Procedure: RIGHT TOTAL HIP ARTHROPLASTY ANTERIOR APPROACH;  Surgeon: Wes Hamman, MD;  Location: [facility] OR;  Service: Orthopedics;  Laterality: Right;    Social History:  reports that he has been smoking cigarettes. He has a 30 pack-year smoking history. He has never used smokeless tobacco. He reports current alcohol use of about 6.0 standard drinks of alcohol per week. He reports that he does not use drugs.  Allergies:  Allergies  Allergen Reactions   Penicillins Rash    (Not in a hospital admission)  Physical Exam: Blood pressure 119/82, pulse 67, temperature 97.7 F (36.5 C), temperature source Oral, resp. rate 18, SpO2 99%. General: cooperative male HEENT: head -normocephalic, atraumatic; Eyes: PERRLA, no conjunctival injection Neck- Trachea is midline CV- RRR, normal S1/S2, no lower extremity edema  Pulm- breathing is non-labored Abd- soft, moderately distended, incisions c/d/I - umbilical incision with some fullness but not cellulitis, no peritonitis, no palpable hernias. GU- deferred  MSK- UE/LE symmetrical, no cyanosis, clubbing, or edema. Neuro-  CN II-XII grossly in tact, no paresthesias. Psych- Alert and Oriented x3 with appropriate affect Skin: warm and dry, no rashes or lesions   Results for orders placed or performed during the hospital encounter of 05/20/24 (from the past 48 hours)  Lipase, blood     Status: None   Collection Time: 05/20/24  8:15 AM  Result Value Ref Range   Lipase 31 11 - 51 U/L    Comment: Performed at [facility], [address] [place], [place] [address]  Comprehensive metabolic panel     Status: Abnormal   Collection Time: 05/20/24  8:15 AM  Result Value Ref Range   Sodium 139 135 - 145 mmol/L   Potassium 3.7 3.5 - 5.1 mmol/L   Chloride 102 98 - 111 mmol/L   CO2 28 22 - 32 mmol/L   Glucose, Bld 120 (H) 70 - 99 mg/dL    Comment: Glucose reference range applies only to samples taken after fasting for at least 8 hours.   BUN 13 8 - 23 mg/dL   Creatinine, Ser 9.56 0.61 - 1.24 mg/dL   Calcium 9.1 8.9 - 21.3 mg/dL   Total Protein 8.4 (H) 6.5 - 8.1 g/dL   Albumin 3.6 3.5 - 5.0 g/dL   AST 16 15 - 41 U/L   ALT 16 0 - 44 U/L   Alkaline Phosphatase 53 38 - 126 U/L   Total Bilirubin 0.2 0.0 - 1.2 mg/dL   GFR, Estimated >08 >65 mL/min    Comment: (NOTE) Calculated using the CKD-EPI Creatinine Equation (2021)    Anion gap 9 5 - 15    Comment: Performed at [facility], [address], [place], [place] [address]  CBC     Status: None   Collection Time: 05/20/24  8:15 AM  Result Value Ref Range   WBC 6.2 4.0 - 10.5 K/uL   RBC 4.62 4.22 - 5.81 MIL/uL   Hemoglobin 13.3 13.0 - 17.0 g/dL   HCT 62.9 52.8 - 41.3 %   MCV 90.0 80.0 - 100.0 fL   MCH 28.8 26.0 - 34.0 pg   MCHC 32.0 30.0 - 36.0 g/dL   RDW 24.4 01.0 - 27.2 %   Platelets 377 150 - 400 K/uL   nRBC 0.0 0.0 - 0.2 %    Comment: Performed at [facility], [address], [place], [place] [address]   CT ABDOMEN PELVIS W CONTRAST Result Date: 05/20/2024 CLINICAL DATA:  Abdominal pain,  post-op, hernia repair 04/30/2024 EXAM: CT ABDOMEN AND PELVIS WITH CONTRAST TECHNIQUE: Multidetector CT imaging of the abdomen and pelvis was performed using the standard protocol following bolus administration of intravenous contrast. RADIATION DOSE REDUCTION: This exam was performed according to the departmental dose-optimization program which includes automated exposure control, adjustment of the mA and/or kV according to patient size and/or use of iterative reconstruction technique. CONTRAST:  OMNIPAQUE IOHEXOL 300 MG/ML  SOLN COMPARISON:  May 12, 2024 FINDINGS: Lower chest: No focal airspace consolidation or pleural  effusion. Hepatobiliary: No mass.No radiopaque stones or wall thickening of the gallbladder. No intrahepatic or extrahepatic biliary ductal dilation. Pancreas: No mass or main ductal dilation. No peripancreatic inflammation or fluid collection. Spleen: Normal size. No mass. Adrenals/Urinary Tract: No adrenal masses. No renal mass. No nephrolithiasis or hydronephrosis. The urinary bladder is distended without focal abnormality. Stomach/Bowel: Fluid-filled, distended stomach. Abnormal dilation of multiple segments of small bowel measuring up to 4.2 cm. Again noted is a transition point in the mid right abdomen, where there are multiple decompressed, clustered segments of small bowel (axial 55), measuring 4.9 x 7.4 cm.Apparent twisting of the small bowel at the transition point (for example, axial 47-51) leading to these clustered segments of decompressed small bowel.There is also apparent tethering of the cecum and terminal ileum in the right lower quadrant (axial 41-48). Almost the entire colon is colon is completely decompressed. Vascular/Lymphatic: No aortic aneurysm. Scattered aortoiliac atherosclerosis. No intraabdominal or pelvic lymphadenopathy. Reproductive: The prostate was obscured by metallic streak artifact from the right hip arthroplasty.Small volume free fluid in the pelvis. Other:  No pneumoperitoneum. Musculoskeletal: No acute fracture or destructive lesion. Right hip arthroplasty is anatomically aligned without dislocation. Diffuse osteopenia. Multilevel degenerative disc disease of the spine. Moderate to severe left hip osteoarthritis. Unchanged chronic compression deformities of T12, L2, and L4. IMPRESSION: 1. Redemonstrated findings of a high-grade small bowel obstruction. The transition point is in the mid to lower right abdomen, where there is apparent twisting of the small bowel (axial 47-51), leading to multiple, clustered segments of completely decompressed of small bowel (axial 55). There is also apparent tethering of the cecum and terminal ileum in the right lower quadrant (axial 41-48). This is worrisome for a high-grade obstruction either due to multiple underlying adhesions or an internal hernia. Surgical consultation recommended. 2. Small volume free fluid in the pelvis, likely reactive. No pneumatosis, portal venous gas, or pneumoperitoneum, at this time. 3. Small fluid and gas collection in the right paraumbilical subcutaneous tissues, measuring 2.3 x 2.8 x 3.7 cm (axial 61), worrisome for a small abscess. Electronically Signed   By: Rance Burrows M.D.   On: 05/20/2024 11:11      Assessment/Plan pSBO vs ileus s/p incisional hernia repair with mesh 04/30/24. Dr. Lucienne Ryder - afebrile, VSS, labs unemarkable - CT concerning for pSBO related to adhesions vs food bolus, I do not see evidence of recurrent hernia causing obstruction.  - recommend NG placement and patient is agreeable to this. No emergent surgical needs. - case discussed with Dr. Lucienne Ryder who is coming to the ED to evaluate the patient this afternoon. Admit to Dr. Lucienne Ryder.   FEN - NPO, NG LIWS, LR @ 75 mL/hr VTE - Lovenox  ID - none   I reviewed nursing notes, ED provider notes, last 24 h vitals and pain scores, last 48 h intake and output, last 24 h labs and trends, and last 24 h imaging  results.  Charlott Converse, PA-C [facility] 05/20/2024, 12:11 PM Please see Amion for pager number during day hours 7:00am-4:30pm or 7:00am -11:30am on weekends

## 2024-05-20 NOTE — ED Provider Notes (Signed)
 Jared Rhodes EMERGENCY DEPARTMENT AT [facility] Provider Note   CSN: [id number] Arrival date & time: 05/20/24  4098     History  Chief Complaint  Patient presents with   Abdominal Pain    Jared Rhodes  Jared Rhodes. is a 73 y.o. male.   Abdominal Pain Patient presents with pain nausea and vomiting.  Had umbilical hernia repair 20 days ago.  Complicated by bowel obstruction.  Got out of hospital 6 days ago for that.  Thought to be ileus versus obstruction.  Has had somewhat continued pain.  States however now increasing pain and vomiting.  Did pass gas but has not had bowel movement today.  No fevers.    Past Medical History:  Diagnosis Date   Hypertension    Prostate cancer (HCC) 2018   radation 40 treatments    Past Surgical History:  Procedure Laterality Date   COLONOSCOPY     I & D EXTREMITY Left 07/17/2014   Procedure: IRRIGATION AND DEBRIDEMENT EXTREMITY;  Surgeon: Edison Gore, MD;  Location: [facility] OR;  Service: Orthopedics;  Laterality: Left;   I & D EXTREMITY Left 07/19/2014   Procedure: IRRIGATION AND DEBRIDEMENT EXTREMITY;  Surgeon: Edison Gore, MD;  Location: [facility] OR;  Service: Orthopedics;  Laterality: Left;   INCISIONAL HERNIA REPAIR N/A 04/30/2024   Procedure: REPAIR, HERNIA, INCISIONAL;  Surgeon: [name], MD;  Location: [facility] OR;  Service: General;  Laterality: N/A;   INSERTION OF MESH N/A 04/30/2024   Procedure: INSERTION OF MESH;  Surgeon: [name], MD;  Location: [facility] OR;  Service: General;  Laterality: N/A;   LYMPHADENECTOMY Bilateral 09/25/2021   Procedure: Ane Keener, PELVIC;  Surgeon: Florencio Hunting, MD;  Location: [facility] ORS;  Service: Urology;  Laterality: Bilateral;   ORIF ANKLE FRACTURE Left 07/19/2014   Procedure: OPEN REDUCTION INTERNAL FIXATION (ORIF) ANKLE FRACTURE;  Surgeon: Edison Gore, MD;  Location: [facility] OR;  Service: Orthopedics;  Laterality: Left;   PROSTATE BIOPSY     ROBOT ASSISTED LAPAROSCOPIC RADICAL  PROSTATECTOMY N/A 09/25/2021   Procedure: XI ROBOTIC ASSISTED LAPAROSCOPIC RADICAL PROSTATECTOMY LEVEL 3;  Surgeon: Florencio Hunting, MD;  Location: [facility] ORS;  Service: Urology;  Laterality: N/A;   TONSILLECTOMY     TOTAL HIP ARTHROPLASTY Right 03/02/2016   Procedure: RIGHT TOTAL HIP ARTHROPLASTY ANTERIOR APPROACH;  Surgeon: Wes Hamman, MD;  Location: [facility] OR;  Service: Orthopedics;  Laterality: Right;    Home Medications Prior to Admission medications   Medication Sig Start Date End Date Taking? Authorizing Provider  acetaminophen  (TYLENOL ) 325 MG tablet Take 650 mg by mouth every 6 (six) hours as needed for moderate pain.    [provider]  amLODipine  (NORVASC ) 10 MG tablet Take 1 tablet (10 mg total) by mouth daily. 12/09/23   Marius Siemens, NP  oxyCODONE  (OXY IR/ROXICODONE ) 5 MG immediate release tablet Take 1 tablet (5 mg total) by mouth every 6 (six) hours as needed for moderate pain (pain score 4-6) or severe pain (pain score 7-10). 05/01/24   [name], MD  solifenacin (VESICARE) 5 MG tablet Take 5 mg by mouth daily. 02/05/24   [provider]      Allergies    Penicillins    Review of Systems   Review of Systems  Gastrointestinal:  Positive for abdominal pain.    Physical Exam Updated Vital Signs BP 119/82 (BP Location: Right Arm)   Pulse 67   Temp 97.7 F (36.5 C) (Oral)   Resp 18   SpO2 99%  Physical Exam Vitals and nursing note reviewed.  Cardiovascular:     Rate and Rhythm: Normal rate.  Abdominal:     Tenderness: There is abdominal tenderness.     Comments: No distention.  Mild diffuse pain.  Neurological:     Mental Status: He is alert.     ED Results / Procedures / Treatments   Labs (all labs ordered are listed, but only abnormal results are displayed) Labs Reviewed  COMPREHENSIVE METABOLIC PANEL WITH GFR - Abnormal; Notable for the following components:      Result Value   Glucose, Bld 120 (*)    Total Protein 8.4 (*)     All other components within normal limits  LIPASE, BLOOD  CBC  URINALYSIS, ROUTINE W REFLEX MICROSCOPIC    EKG None  Radiology CT ABDOMEN PELVIS W CONTRAST Result Date: 05/20/2024 CLINICAL DATA:  Abdominal pain, post-op, hernia repair 04/30/2024 EXAM: CT ABDOMEN AND PELVIS WITH CONTRAST TECHNIQUE: Multidetector CT imaging of the abdomen and pelvis was performed using the standard protocol following bolus administration of intravenous contrast. RADIATION DOSE REDUCTION: This exam was performed according to the departmental dose-optimization program which includes automated exposure control, adjustment of the mA and/or kV according to patient size and/or use of iterative reconstruction technique. CONTRAST:  OMNIPAQUE  IOHEXOL  300 MG/ML  SOLN COMPARISON:  May 12, 2024 FINDINGS: Lower chest: No focal airspace consolidation or pleural effusion. Hepatobiliary: No mass.No radiopaque stones or wall thickening of the gallbladder. No intrahepatic or extrahepatic biliary ductal dilation. Pancreas: No mass or main ductal dilation. No peripancreatic inflammation or fluid collection. Spleen: Normal size. No mass. Adrenals/Urinary Tract: No adrenal masses. No renal mass. No nephrolithiasis or hydronephrosis. The urinary bladder is distended without focal abnormality. Stomach/Bowel: Fluid-filled, distended stomach. Abnormal dilation of multiple segments of small bowel measuring up to 4.2 cm. Again noted is a transition point in the mid right abdomen, where there are multiple decompressed, clustered segments of small bowel (axial 55), measuring 4.9 x 7.4 cm.Apparent twisting of the small bowel at the transition point (for example, axial 47-51) leading to these clustered segments of decompressed small bowel.There is also apparent tethering of the cecum and terminal ileum in the right lower quadrant (axial 41-48). Almost the entire colon is colon is completely decompressed. Vascular/Lymphatic: No aortic aneurysm.  Scattered aortoiliac atherosclerosis. No intraabdominal or pelvic lymphadenopathy. Reproductive: The prostate was obscured by metallic streak artifact from the right hip arthroplasty.Small volume free fluid in the pelvis. Other: No pneumoperitoneum. Musculoskeletal: No acute fracture or destructive lesion. Right hip arthroplasty is anatomically aligned without dislocation. Diffuse osteopenia. Multilevel degenerative disc disease of the spine. Moderate to severe left hip osteoarthritis. Unchanged chronic compression deformities of T12, L2, and L4. IMPRESSION: 1. Redemonstrated findings of a high-grade small bowel obstruction. The transition point is in the mid to lower right abdomen, where there is apparent twisting of the small bowel (axial 47-51), leading to multiple, clustered segments of completely decompressed of small bowel (axial 55). There is also apparent tethering of the cecum and terminal ileum in the right lower quadrant (axial 41-48). This is worrisome for a high-grade obstruction either due to multiple underlying adhesions or an internal hernia. Surgical consultation recommended. 2. Small volume free fluid in the pelvis, likely reactive. No pneumatosis, portal venous gas, or pneumoperitoneum, at this time. 3. Small fluid and gas collection in the right paraumbilical subcutaneous tissues, measuring 2.3 x 2.8 x 3.7 cm (axial 61), worrisome for a small abscess. Electronically Signed   By: Zeb Heys  Molly Angers M.D.   On: 05/20/2024 11:11    Procedures Procedures    Medications Ordered in ED Medications  fentaNYL  (SUBLIMAZE ) injection 50 mcg (0 mcg Intravenous Hold 05/20/24 0825)  ondansetron  (ZOFRAN ) injection 4 mg (4 mg Intravenous Given 05/20/24 0825)  iohexol (OMNIPAQUE) 300 MG/ML solution 100 mL (100 mLs Intravenous Contrast Given 05/20/24 0944)    ED Course/ Medical Decision Making/ A&P                                 Medical Decision Making Amount and/or Complexity of Data Reviewed Labs:  ordered. Radiology: ordered.  Risk Prescription drug management. Decision regarding hospitalization.   Patient with recurrent abdominal pain and vomiting post abdominal hernia repair.  Reviewed discharge notes.  Reviewed CT scan.  Will get basic blood work give symptomatic treatment, will contact general surgery.  Will also get CT scan.  Blood work reassuring.  Discussed with general surgery who will follow with the scan.  CT scan shows potential high-grade bowel obstruction.  Patient not vomiting here however.  Surgery is going to come and see patient.          Final Clinical Impression(s) / ED Diagnoses Final diagnoses:  Small bowel obstruction [facility])    Rx / DC Orders ED Discharge Orders     None         Mozell Arias, MD 05/20/24 1141

## 2024-05-20 NOTE — ED Triage Notes (Signed)
 Pt c/o lower abdominal pain that started last night, states he started vomiting this morning. Pt w/recent hernia surgery and pain since then.

## 2024-05-20 NOTE — Transitions of Care (Post Inpatient/ED Visit) (Signed)
   05/20/2024  Name: Jared Chapel Ledwell  Jr. MRN: [id number] DOB: [birth year]  Today's TOC FU Call Status: Today's TOC FU Call Status:: Unsuccessful Call (3rd Attempt) Unsuccessful Call (1st Attempt) Date: 05/15/24 Unsuccessful Call (2nd Attempt) Date: 05/19/24 Unsuccessful Call (3rd Attempt) Date: 05/20/24  Attempted to reach the patient regarding the most recent Inpatient/ED visit.  Follow Up Plan: No further outreach attempts will be made at this time. We have been unable to contact the patient.  Signature Darrall Ellison, LPN [facility] Nurse Health Advisor Direct Dial [phone]

## 2024-05-20 NOTE — Plan of Care (Signed)

## 2024-05-21 ENCOUNTER — Inpatient Hospital Stay (HOSPITAL_COMMUNITY)

## 2024-05-21 LAB — URINALYSIS, ROUTINE W REFLEX MICROSCOPIC
Bacteria, UA: NONE SEEN
Bilirubin Urine: NEGATIVE
Glucose, UA: NEGATIVE mg/dL
Hgb urine dipstick: NEGATIVE
Ketones, ur: 20 mg/dL — AB
Leukocytes,Ua: NEGATIVE
Nitrite: NEGATIVE
Protein, ur: 30 mg/dL — AB
Specific Gravity, Urine: 1.03 (ref 1.005–1.030)
pH: 5 (ref 5.0–8.0)

## 2024-05-21 LAB — BASIC METABOLIC PANEL WITH GFR
Anion gap: 6 (ref 5–15)
BUN: 10 mg/dL (ref 8–23)
CO2: 27 mmol/L (ref 22–32)
Calcium: 8.6 mg/dL — ABNORMAL LOW (ref 8.9–10.3)
Chloride: 103 mmol/L (ref 98–111)
Creatinine, Ser: 0.85 mg/dL (ref 0.61–1.24)
GFR, Estimated: >60 mL/min (ref >60)
Glucose, Bld: 110 mg/dL — ABNORMAL HIGH (ref 70–99)
Potassium: 3.7 mmol/L (ref 3.5–5.1)
Sodium: 136 mmol/L (ref 135–145)

## 2024-05-21 MED ORDER — DIATRIZOATE MEGLUMINE & SODIUM 66-10 % PO SOLN
90.0000 mL | Freq: Once | ORAL | Status: AC
  Administered 2024-05-21: 90 mL via NASOGASTRIC
  Filled 2024-05-21: qty 90

## 2024-05-21 MED ORDER — MORPHINE SULFATE (PF) 2 MG/ML IV SOLN: 2.0000 mg | INTRAVENOUS | Status: DC | PRN

## 2024-05-21 MED ORDER — LACTATED RINGERS IV SOLN: INTRAVENOUS | Status: AC

## 2024-05-21 MED ORDER — ACETAMINOPHEN 10 MG/ML IV SOLN
1000.0000 mg | Freq: Once | INTRAVENOUS | Status: AC
  Administered 2024-05-21: 1000 mg via INTRAVENOUS
  Filled 2024-05-21: qty 100

## 2024-05-21 NOTE — Progress Notes (Signed)
 Pt very upset this am w multiple complaints. States that he doesn't feel better and "it shouldn't take this long . Aaron Aas To fix this . . " Allowed pt to vent concerns and support offered, prn meds given but pt remains verbally aggressive, states "before too long I'm gonna rip this out and leave . Aaron Aas And find somebody that WILL fix this . ." MD notified that pt has concerns

## 2024-05-21 NOTE — Progress Notes (Signed)
 Patient ID: Jared Rhodes  Jared Rhodes., male   DOB: May 13, 1951, 74 y.o.   MRN: [id number]      Called to the bedside nursing staff to address patient concerns.  Patient requesting to have NG tube removed and to be allowed to eat.  Patient threatening to leave AMA.  Patient was seen and evaluated this morning by his attending physician, Dr. Salena Craven.  Patient currently admitted with small bowel obstruction.  He has a nasogastric tube in place.  Dr. Lucienne Ryder had ordered the small bowel protocol study.  Contrast has been administered and the patient has a follow-up abdominal x-ray scheduled for 6:30 PM this evening.  I reviewed with the patient that he does have small bowel obstruction.  At this time he is comfortable.  He denies any pain.  He states that he did have a large bowel movement this morning.  I explained that we would like to keep the nasogastric tube in position until we are able to check an abdominal x-ray at 6:30 PM this evening.  If contrast has traversed the entire small bowel and is reached the colon then we will clamp the nasogastric tube and begin a clear liquid diet.  Patient request not to have a liquid diet but to have solid food.  I explained to him that we would start with the liquid diet and if that was tolerated then we would advance his diet.  I also explained that if the patient had persistent obstruction that he may well require operative intervention in the next few days.  Abdominal exam shows his abdomen to be protuberant but relatively soft.  Surgical wound appears to be healing well without complication.  There is no significant tenderness to palpation.  Patient is encouraged to ambulate in the halls today.  I told him that he may take ice chips for comfort.  I told him to limit the amount of oral intake that he consumes until we are sure that the obstruction has resolved.  Patient expresses understanding.  I discussed the above with his current nurse, Ammon Bales.  Oralee Billow, MD [facility] practice Office: [phone]

## 2024-05-21 NOTE — Progress Notes (Signed)
 Subjective/Chief Complaint: Pt reports some mild cramping abdominal pain Passing some flatus 1100 cc out of NG   Objective: Vital signs in last 24 hours: Temp:  [97.5 F (36.4 C)-98.6 F (37 C)] 98.6 F (37 C) (05/29 0531) Pulse Rate:  [67-92] 81 (05/29 0531) Resp:  [15-18] 16 (05/29 0531) BP: (118-149)/(71-96) 118/83 (05/29 0531) SpO2:  [93 %-100 %] 93 % (05/29 0531) Weight:  [97.1 kg] 97.1 kg (05/28 1758) Last BM Date : 05/19/24  Intake/Output from previous day: 05/28 0701 - 05/29 0700 In: 1311.4 [P.O.:120; I.V.:1071.4; NG/GT:120] Out: 1250 [Urine:150; Emesis/NG output:1100] Intake/Output this shift: No intake/output data recorded.  Exam: Awake and alert Abdomen soft, obese, mildly full, minimally tender  Lab Results:  Recent Labs    05/20/24 0815  WBC 6.2  HGB 13.3  HCT 41.6  PLT 377   BMET Recent Labs    05/20/24 0815 05/21/24 0445  NA 139 136  K 3.7 3.7  CL 102 103  CO2 28 27  GLUCOSE 120* 110*  BUN 13 10  CREATININE 0.85 0.85  CALCIUM 9.1 8.6*   PT/INR No results for input(s): "LABPROT", "INR" in the last 72 hours. ABG No results for input(s): "PHART", "HCO3" in the last 72 hours.  Invalid input(s): "PCO2", "PO2"  Studies/Results: DG Abd 1 View Result Date: 05/20/2024 CLINICAL DATA:  NG placement. EXAM: ABDOMEN - 1 VIEW COMPARISON:  Abdominal radiograph dated 05/14/2024. CT dated 05/20/2024. FINDINGS: Enteric tube with tip and side-port in the left upper abdomen in the region of the gastric fundus. Persistent dilatation of small-bowel loops measure up to 5.5 cm. IMPRESSION: Enteric tube with tip and side-port in the region of the gastric fundus. Electronically Signed   By: Angus Bark M.D.   On: 05/20/2024 18:23   CT ABDOMEN PELVIS W CONTRAST Result Date: 05/20/2024 CLINICAL DATA:  Abdominal pain, post-op, hernia repair 04/30/2024 EXAM: CT ABDOMEN AND PELVIS WITH CONTRAST TECHNIQUE: Multidetector CT imaging of the abdomen and pelvis  was performed using the standard protocol following bolus administration of intravenous contrast. RADIATION DOSE REDUCTION: This exam was performed according to the departmental dose-optimization program which includes automated exposure control, adjustment of the mA and/or kV according to patient size and/or use of iterative reconstruction technique. CONTRAST:  OMNIPAQUE IOHEXOL 300 MG/ML  SOLN COMPARISON:  May 12, 2024 FINDINGS: Lower chest: No focal airspace consolidation or pleural effusion. Hepatobiliary: No mass.No radiopaque stones or wall thickening of the gallbladder. No intrahepatic or extrahepatic biliary ductal dilation. Pancreas: No mass or main ductal dilation. No peripancreatic inflammation or fluid collection. Spleen: Normal size. No mass. Adrenals/Urinary Tract: No adrenal masses. No renal mass. No nephrolithiasis or hydronephrosis. The urinary bladder is distended without focal abnormality. Stomach/Bowel: Fluid-filled, distended stomach. Abnormal dilation of multiple segments of small bowel measuring up to 4.2 cm. Again noted is a transition point in the mid right abdomen, where there are multiple decompressed, clustered segments of small bowel (axial 55), measuring 4.9 x 7.4 cm.Apparent twisting of the small bowel at the transition point (for example, axial 47-51) leading to these clustered segments of decompressed small bowel.There is also apparent tethering of the cecum and terminal ileum in the right lower quadrant (axial 41-48). Almost the entire colon is colon is completely decompressed. Vascular/Lymphatic: No aortic aneurysm. Scattered aortoiliac atherosclerosis. No intraabdominal or pelvic lymphadenopathy. Reproductive: The prostate was obscured by metallic streak artifact from the right hip arthroplasty.Small volume free fluid in the pelvis. Other: No pneumoperitoneum. Musculoskeletal: No acute fracture or destructive  lesion. Right hip arthroplasty is anatomically aligned without  dislocation. Diffuse osteopenia. Multilevel degenerative disc disease of the spine. Moderate to severe left hip osteoarthritis. Unchanged chronic compression deformities of T12, L2, and L4. IMPRESSION: 1. Redemonstrated findings of a high-grade small bowel obstruction. The transition point is in the mid to lower right abdomen, where there is apparent twisting of the small bowel (axial 47-51), leading to multiple, clustered segments of completely decompressed of small bowel (axial 55). There is also apparent tethering of the cecum and terminal ileum in the right lower quadrant (axial 41-48). This is worrisome for a high-grade obstruction either due to multiple underlying adhesions or an internal hernia. Surgical consultation recommended. 2. Small volume free fluid in the pelvis, likely reactive. No pneumatosis, portal venous gas, or pneumoperitoneum, at this time. 3. Small fluid and gas collection in the right paraumbilical subcutaneous tissues, measuring 2.3 x 2.8 x 3.7 cm (axial 61), worrisome for a small abscess. Electronically Signed   By: Rance Burrows M.D.   On: 05/20/2024 11:11    Anti-infectives: Anti-infectives (From admission, onward)    None       Assessment/Plan: SBO  -patient stable this morning with some bowel function.  Likely has a high grade partial obstruction. -will order the small bowel protocol -my partners will follow-up his care as I will be out on leave starting today.  I explained this to the patient who agrees with the plans   LOS: 1 day    [name] 05/21/2024

## 2024-05-21 NOTE — Plan of Care (Signed)

## 2024-05-22 NOTE — Progress Notes (Signed)
 Subjective/Chief Complaint: Feeling much better.  Had a nonbloody bowel movement this morning and is passing gas.  NG with 700 mL documented, this appears clear  Objective: Vital signs in last 24 hours: Temp:  [97.6 F (36.4 C)-98.7 F (37.1 C)] 97.6 F (36.4 C) (05/30 0529) Pulse Rate:  [67-72] 70 (05/30 0529) Resp:  [15-16] 15 (05/30 0529) BP: (124-138)/(81-89) 138/89 (05/30 0529) SpO2:  [95 %-97 %] 96 % (05/30 0529) Last BM Date : 05/19/24  Intake/Output from previous day: 05/29 0701 - 05/30 0700 In: 990 [P.O.:270; I.V.:600; NG/GT:120] Out: 800 [Urine:100; Emesis/NG output:700] Intake/Output this shift: No intake/output data recorded.  Exam: Awake and alert Abdomen soft, obese, interval decrease in abdominal distention, nontender  NG tube was removed by me at the bedside without complication  Lab Results:  Recent Labs    05/20/24 0815  WBC 6.2  HGB 13.3  HCT 41.6  PLT 377   BMET Recent Labs    05/20/24 0815 05/21/24 0445  NA 139 136  K 3.7 3.7  CL 102 103  CO2 28 27  GLUCOSE 120* 110*  BUN 13 10  CREATININE 0.85 0.85  CALCIUM 9.1 8.6*   PT/INR No results for input(s): "LABPROT", "INR" in the last 72 hours. ABG No results for input(s): "PHART", "HCO3" in the last 72 hours.  Invalid input(s): "PCO2", "PO2"  Studies/Results: DG Abd Portable 1V-Small Bowel Obstruction Protocol-initial, 8 hr delay Result Date: 05/21/2024 CLINICAL DATA:  Small bowel obstruction, 8 hour delay EXAM: PORTABLE ABDOMEN - 1 VIEW COMPARISON:  Radiograph yesterday FINDINGS: There is enteric contrast within the ascending, transverse, descending and likely sigmoid colon. Diminishing gaseous small bowel distension centrally. Enteric tube remains in place. IMPRESSION: Enteric contrast within the colon. Diminishing gaseous small bowel distension centrally. Findings likely represent resolving small bowel obstruction. Electronically Signed   By: Chadwick Colonel M.D.   On: 05/21/2024  22:22   DG Abd 1 View Result Date: 05/20/2024 CLINICAL DATA:  NG placement. EXAM: ABDOMEN - 1 VIEW COMPARISON:  Abdominal radiograph dated 05/14/2024. CT dated 05/20/2024. FINDINGS: Enteric tube with tip and side-port in the left upper abdomen in the region of the gastric fundus. Persistent dilatation of small-bowel loops measure up to 5.5 cm. IMPRESSION: Enteric tube with tip and side-port in the region of the gastric fundus. Electronically Signed   By: Angus Bark M.D.   On: 05/20/2024 18:23   CT ABDOMEN PELVIS W CONTRAST Result Date: 05/20/2024 CLINICAL DATA:  Abdominal pain, post-op, hernia repair 04/30/2024 EXAM: CT ABDOMEN AND PELVIS WITH CONTRAST TECHNIQUE: Multidetector CT imaging of the abdomen and pelvis was performed using the standard protocol following bolus administration of intravenous contrast. RADIATION DOSE REDUCTION: This exam was performed according to the departmental dose-optimization program which includes automated exposure control, adjustment of the mA and/or kV according to patient size and/or use of iterative reconstruction technique. CONTRAST:  OMNIPAQUE IOHEXOL 300 MG/ML  SOLN COMPARISON:  May 12, 2024 FINDINGS: Lower chest: No focal airspace consolidation or pleural effusion. Hepatobiliary: No mass.No radiopaque stones or wall thickening of the gallbladder. No intrahepatic or extrahepatic biliary ductal dilation. Pancreas: No mass or main ductal dilation. No peripancreatic inflammation or fluid collection. Spleen: Normal size. No mass. Adrenals/Urinary Tract: No adrenal masses. No renal mass. No nephrolithiasis or hydronephrosis. The urinary bladder is distended without focal abnormality. Stomach/Bowel: Fluid-filled, distended stomach. Abnormal dilation of multiple segments of small bowel measuring up to 4.2 cm. Again noted is a transition point in the mid right abdomen,  where there are multiple decompressed, clustered segments of small bowel (axial 55), measuring 4.9 x  7.4 cm.Apparent twisting of the small bowel at the transition point (for example, axial 47-51) leading to these clustered segments of decompressed small bowel.There is also apparent tethering of the cecum and terminal ileum in the right lower quadrant (axial 41-48). Almost the entire colon is colon is completely decompressed. Vascular/Lymphatic: No aortic aneurysm. Scattered aortoiliac atherosclerosis. No intraabdominal or pelvic lymphadenopathy. Reproductive: The prostate was obscured by metallic streak artifact from the right hip arthroplasty.Small volume free fluid in the pelvis. Other: No pneumoperitoneum. Musculoskeletal: No acute fracture or destructive lesion. Right hip arthroplasty is anatomically aligned without dislocation. Diffuse osteopenia. Multilevel degenerative disc disease of the spine. Moderate to severe left hip osteoarthritis. Unchanged chronic compression deformities of T12, L2, and L4. IMPRESSION: 1. Redemonstrated findings of a high-grade small bowel obstruction. The transition point is in the mid to lower right abdomen, where there is apparent twisting of the small bowel (axial 47-51), leading to multiple, clustered segments of completely decompressed of small bowel (axial 55). There is also apparent tethering of the cecum and terminal ileum in the right lower quadrant (axial 41-48). This is worrisome for a high-grade obstruction either due to multiple underlying adhesions or an internal hernia. Surgical consultation recommended. 2. Small volume free fluid in the pelvis, likely reactive. No pneumatosis, portal venous gas, or pneumoperitoneum, at this time. 3. Small fluid and gas collection in the right paraumbilical subcutaneous tissues, measuring 2.3 x 2.8 x 3.7 cm (axial 61), worrisome for a small abscess. Electronically Signed   By: Rance Burrows M.D.   On: 05/20/2024 11:11    Anti-infectives: Anti-infectives (From admission, onward)    None        Assessment/Plan: SBO Imaging concerning for a high-grade partial small bowel obstruction.  He underwent the small bowel obstruction protocol yesterday.  Follow-up x-ray yesterday shows decreasing small bowel distention and contrast in the colon.  Obstruction is clinically resolving, patient had a large bowel movement this morning.  NG tube removed.  Placed on a full liquid diet.  Will observe him overnight and his ability to tolerate p.o., possible discharge home tomorrow if continuing to improve.   LOS: 2 days    Charlott Converse 05/22/2024

## 2024-05-22 NOTE — TOC Initial Note (Signed)
 Transition of Care [facility]) - Initial/Assessment Note    Patient Details  Name: Jared Rhodes. MRN: [id number] Date of Birth: [birth year]  [facility]) CM/SW Contact:    Levie Ream, RN Phone Number: 05/22/2024, 2:43 PM  Clinical Narrative:                 Eldora Greet w/ pt in room; pt says he lives at home; he plans to return at d/c; pt identified POC Jolinda Necessary (SO) [phone]; she will provide transportation; pt verified insurance/PCP; he denied SDOH risks; pt says he does not have DME, HH services, or home oxygen; no TOC needs; TOC signing off; please place consult if needed.  Expected Discharge Plan: Home/Self Care Barriers to Discharge: Continued Medical Work up   Patient Goals and CMS Choice Patient states their goals for this hospitalization and ongoing recovery are:: home CMS Medicare.gov Compare Post Acute Care list provided to:: Patient   [place] ownership interest in [facility].provided to:: Patient    Expected Discharge Plan and Services   Discharge Planning Services: CM Consult   Living arrangements for the past 2 months: Single Family Home                                      Prior Living Arrangements/Services Living arrangements for the past 2 months: Single Family Home Lives with:: Significant Other Patient language and need for interpreter reviewed:: Yes        Need for Family Participation in Patient Care: Yes (Comment) Care giver support system in place?: Yes (comment) Current home services:  (n/a) Criminal Activity/Legal Involvement Pertinent to Current Situation/Hospitalization: No - Comment as needed  Activities of Daily Living   ADL Screening (condition at time of admission) Independently performs ADLs?: Yes (appropriate for developmental age) Is the patient deaf or have difficulty hearing?: No Does the patient have difficulty seeing, even when wearing glasses/contacts?: No Does the patient have  difficulty concentrating, remembering, or making decisions?: No  Permission Sought/Granted Permission sought to share information with : Case Manager Permission granted to share information with : Yes, Verbal Permission Granted  Share Information with NAME: Case Manager     Permission granted to share info w Relationship: Jolinda Necessary (SO) [phone]     Emotional Assessment Appearance:: Appears stated age Attitude/Demeanor/Rapport: Gracious Affect (typically observed): Accepting Orientation: : Oriented to Self, Oriented to Place, Oriented to  Time, Oriented to Situation Alcohol / Substance Use: Not Applicable Psych Involvement: No (comment)  Admission diagnosis:  Small bowel obstruction (HCC) [K56.609] SBO (small bowel obstruction) (HCC) [K56.609] Patient Active Problem List   Diagnosis Date Noted   SBO (small bowel obstruction) (HCC) 05/13/2024   Incisional hernia 04/30/2024   Prostate cancer (HCC) 02/01/2017   Osteoarthritis of right hip 03/02/2016   Hip joint replacement status 03/02/2016   Alcohol intoxication (HCC) 07/17/2014   Type III open fracture dislocation of left ankle joint 07/17/2014   HTN (hypertension) 04/17/2014   Severe obesity (BMI >= 40) (HCC) 04/17/2014   PCP:  Marius Siemens, NP Pharmacy:   [facility] #[id number] Jonette Nestle, [place] - [address] AT [facility] [address] [place] [address] Phone: [phone] Fax: [phone]  [facility] - [place], [place] - [address] [place] [address] [place] [address] Phone: [phone] Fax: [phone]     Social Drivers of  Health (SDOH) Social History: SDOH Screenings   Food Insecurity: No Food Insecurity (05/22/2024)  Housing: Low Risk  (05/22/2024)  Transportation Needs: No Transportation Needs (05/22/2024)  Utilities: Not At Risk (05/22/2024)  Alcohol Screen: Low Risk  (11/09/2021)  Depression (PHQ2-9): Low Risk  (12/09/2023)  Financial Resource Strain: Low  Risk  (11/09/2021)  Physical Activity: Sufficiently Active (11/09/2021)  Social Connections: Socially Isolated (05/20/2024)  Stress: No Stress Concern Present (11/09/2021)  Tobacco Use: High Risk (05/20/2024)   SDOH Interventions: Food Insecurity Interventions: Intervention Not Indicated, Inpatient TOC Housing Interventions: Intervention Not Indicated, Inpatient TOC Transportation Interventions: Intervention Not Indicated, Inpatient TOC Utilities Interventions: Intervention Not Indicated, Inpatient TOC   Readmission Risk Interventions    05/13/2024    3:44 PM  Readmission Risk Prevention Plan  Transportation Screening Complete  PCP or Specialist Appt within 5-7 Days Complete  Home Care Screening Complete  Medication Review (RN CM) Complete

## 2024-05-22 NOTE — Plan of Care (Signed)

## 2024-05-22 NOTE — Plan of Care (Signed)

## 2024-05-23 NOTE — Progress Notes (Signed)
   Subjective/Chief Complaint: PT doing well  Tol PO well but having some abd pain after eating Bms No n/v   Objective: Vital signs in last 24 hours: Temp:  [98.1 F (36.7 C)-98.6 F (37 C)] 98.5 F (36.9 C) (05/31 0537) Pulse Rate:  [64-70] 64 (05/31 0537) Resp:  [16-18] 16 (05/31 0537) BP: (122-130)/(78-89) 129/86 (05/31 0537) SpO2:  [98 %-99 %] 98 % (05/31 0537) Last BM Date : 05/22/24  Intake/Output from previous day: 05/30 0701 - 05/31 0700 In: 2050 [P.O.:2050] Out: 0  Intake/Output this shift: No intake/output data recorded.  PE:  Constitutional: No acute distress, conversant, appears states age. Eyes: Anicteric sclerae, moist conjunctiva, no lid lag Lungs: Clear to auscultation bilaterally, normal respiratory effort CV: regular rate and rhythm, no murmurs, no peripheral edema, pedal pulses 2+ GI: Soft, no masses or hepatosplenomegaly, non-tender to palpation Skin: No rashes, palpation reveals normal turgor Psychiatric: appropriate judgment and insight, oriented to person, place, and time   Lab Results:  Recent Labs    05/20/24 0815  WBC 6.2  HGB 13.3  HCT 41.6  PLT 377   BMET Recent Labs    05/20/24 0815 05/21/24 0445  NA 139 136  K 3.7 3.7  CL 102 103  CO2 28 27  GLUCOSE 120* 110*  BUN 13 10  CREATININE 0.85 0.85  CALCIUM 9.1 8.6*   PT/INR No results for input(s): "LABPROT", "INR" in the last 72 hours. ABG No results for input(s): "PHART", "HCO3" in the last 72 hours.  Invalid input(s): "PCO2", "PO2"  Studies/Results: DG Abd Portable 1V-Small Bowel Obstruction Protocol-initial, 8 hr delay Result Date: 05/21/2024 CLINICAL DATA:  Small bowel obstruction, 8 hour delay EXAM: PORTABLE ABDOMEN - 1 VIEW COMPARISON:  Radiograph yesterday FINDINGS: There is enteric contrast within the ascending, transverse, descending and likely sigmoid colon. Diminishing gaseous small bowel distension centrally. Enteric tube remains in place. IMPRESSION: Enteric  contrast within the colon. Diminishing gaseous small bowel distension centrally. Findings likely represent resolving small bowel obstruction. Electronically Signed   By: Chadwick Colonel M.D.   On: 05/21/2024 22:22    Anti-infectives: Anti-infectives (From admission, onward)    None       Assessment/Plan: SBO vs ileus - Appears to be doing well.  Tolerating soft diet well.  Having bowel function. I discussed with him that taking his time, likely standard low fiber diet will be helpful with postprandial pain.  Will hopefully be able to discharge tomorrow if feeling well today.  LOS: 3 days    Shela Derby 05/23/2024

## 2024-05-24 NOTE — Discharge Summary (Signed)
 Physician Discharge Summary  Patient ID: Jared Rhodes. MRN: [id number] DOB/AGE: [birth year] 73 y.o.  Admit date: 05/20/2024 Discharge date: 05/24/2024  Admission Diagnoses: SBO status post ventral hernia repair  Discharge Diagnoses:  Principal Problem:   SBO (small bowel obstruction) (HCC)   Discharged Condition: good  Hospital Course: Patient did well after being hospitalized.  He was slowly advanced to a soft diet which he was able to tolerate well.  He is having good bowel function as well as passing good flatus.  He was ambulating well on his own.  He was tolerating p.o. well.  He was deemed stable for discharge and discharged home.  Consults: None  Significant Diagnostic Studies: None  Treatments: None  Discharge Exam: Blood pressure (!) 133/93, pulse 64, temperature 97.8 F (36.6 C), temperature source Oral, resp. rate 16, height 5\' 8"  (1.727 m), weight 97.1 kg, SpO2 98%. General appearance: alert and cooperative GI: soft, non-tender; bowel sounds normal; no masses,  no organomegaly and incisions are clean dry and intact.  Disposition: Discharge disposition: 01-Home or Self Care       Discharge Instructions     Diet - low sodium heart healthy   Complete by: As directed    Increase activity slowly   Complete by: As directed       Allergies as of 05/24/2024       Reactions   Penicillins Rash        Medication List     TAKE these medications    acetaminophen  325 MG tablet Commonly known as: TYLENOL  Take 650 mg by mouth daily as needed for moderate pain (pain score 4-6) or mild pain (pain score 1-3).   amLODipine  10 MG tablet Commonly known as: NORVASC  Take 1 tablet (10 mg total) by mouth daily.   oxyCODONE  5 MG immediate release tablet Commonly known as: Oxy IR/ROXICODONE  Take 1 tablet (5 mg total) by mouth every 6 (six) hours as needed for moderate pain (pain score 4-6) or severe pain (pain score 7-10). What changed: when to take this    solifenacin 5 MG tablet Commonly known as: VESICARE Take 5 mg by mouth daily.         Signed: Shela Derby 05/24/2024, 7:51 AM

## 2024-05-24 NOTE — Progress Notes (Signed)
Assessment unchanged. Pt verbalized understanding of dc instructions through teach back. Discharged via foot per request to front entrance accompanied by NT.

## 2024-05-24 NOTE — Plan of Care (Signed)
   Problem: Nutrition: Goal: Adequate nutrition will be maintained Outcome: Progressing   Problem: Elimination: Goal: Will not experience complications related to bowel motility Outcome: Progressing Goal: Will not experience complications related to urinary retention Outcome: Progressing

## 2024-05-25 ENCOUNTER — Telehealth: Payer: Self-pay

## 2024-05-25 NOTE — Transitions of Care (Post Inpatient/ED Visit) (Signed)
   05/25/2024  Name: Jared Rhodes  Jared Rhodes. MRN: [id number] DOB: 07-28-1951  Today's TOC FU Call Status: Today's TOC FU Call Status:: Successful TOC FU Call Completed TOC FU Call Complete Date: 05/25/24 Patient's Name and Date of Birth confirmed.  Transition Care Management Follow-up Telephone Call Date of Discharge: 05/24/24 Discharge Facility: [facility]) Type of Discharge: Inpatient Admission Primary Inpatient Discharge Diagnosis:: SBO How have you been since you were released from the hospital?: Better Any questions or concerns?: No  Items Reviewed: Did you receive and understand the discharge instructions provided?: Yes Medications obtained,verified, and reconciled?: Yes (Medications Reviewed) (He has all medications and did not have any questions about the med regime) Any new allergies since your discharge?: No Dietary orders reviewed?: Yes Type of Diet Ordered:: soft diet.- He said he is tolerating it well. Do you have support at home?: Yes People in Home [RPT]: friend(s)  Medications Reviewed Today: Medications Reviewed Today     Reviewed by Burnett Carson, RN (Case Manager) on 05/25/24 at 1538  Med List Status: <None>   Medication Order Taking? Sig Documenting Provider Last Dose Status Informant  acetaminophen  (TYLENOL ) 325 MG tablet [id number] No Take 650 mg by mouth daily as needed for moderate pain (pain score 4-6) or mild pain (pain score 1-3). [provider] Unknown Active Self, Pharmacy Records           Med Note Baltazar Leventhal, [place]   Thu Apr 23, 2024 10:26 AM)    amLODipine  (NORVASC ) 10 MG tablet [id number] No Take 1 tablet (10 mg total) by mouth daily. Marius Siemens, NP 05/19/2024 Active Self, Pharmacy Records  oxyCODONE  (OXY IR/ROXICODONE ) 5 MG immediate release tablet [id number] No Take 1 tablet (5 mg total) by mouth every 6 (six) hours as needed for moderate pain (pain score 4-6) or severe pain (pain score 7-10).  Patient taking differently: Take 5  mg by mouth daily as needed for moderate pain (pain score 4-6) or severe pain (pain score 7-10).   [name], MD Past Month Active Self, Pharmacy Records  solifenacin (VESICARE) 5 MG tablet [id number] No Take 5 mg by mouth daily. [provider] Past Week Active Self, Pharmacy Records           Med Note (CRUTHIS, CHLOE C   Wed May 20, 2024 12:27 PM) Pt stated he ran out of this medication a few days ago.   Med List Note (Cruthis, Chloe, CPhT 05/20/24 0950):  Pt is not in room or bed at this time. Will follow up.             Home Care and Equipment/Supplies: Were Home Health Services Ordered?: No Any new equipment or medical supplies ordered?: No  Functional Questionnaire: Do you need assistance with bathing/showering or dressing?: No Do you need assistance with meal preparation?: No Do you need assistance with eating?: No Do you have difficulty maintaining continence: No Do you need assistance with getting out of bed/getting out of a chair/moving?: No Do you have difficulty managing or taking your medications?: No  Follow up appointments reviewed: PCP Follow-up appointment confirmed?: Yes Date of PCP follow-up appointment?: 06/08/24 Follow-up Provider: Madelyn Schick, NP Specialist Hospital Follow-up appointment confirmed?: Yes Date of Specialist follow-up appointment?: 07/14/24 Follow-Up Specialty Provider:: colonoscopy Do you need transportation to your follow-up appointment?: No Do you understand care options if your condition(s) worsen?: Yes-patient verbalized understanding    SIGNATURE Burnett Carson, RN

## 2024-06-01 ENCOUNTER — Telehealth (INDEPENDENT_AMBULATORY_CARE_PROVIDER_SITE_OTHER): Payer: Self-pay | Admitting: Primary Care

## 2024-06-01 NOTE — Telephone Encounter (Signed)
 Called pt to schedule appt. Pt did not answer and could not LVM.

## 2024-06-05 ENCOUNTER — Telehealth (INDEPENDENT_AMBULATORY_CARE_PROVIDER_SITE_OTHER): Payer: Self-pay | Admitting: Primary Care

## 2024-06-05 NOTE — Telephone Encounter (Signed)
 Called pt to confiem appt. Pt did not answer

## 2024-06-08 ENCOUNTER — Ambulatory Visit (INDEPENDENT_AMBULATORY_CARE_PROVIDER_SITE_OTHER): Admitting: Primary Care

## 2024-06-30 ENCOUNTER — Encounter

## 2024-07-02 ENCOUNTER — Telehealth: Payer: Self-pay

## 2024-07-02 ENCOUNTER — Encounter

## 2024-07-02 ENCOUNTER — Ambulatory Visit

## 2024-07-02 VITALS — Ht 68.0 in | Wt 214.0 lb

## 2024-07-02 DIAGNOSIS — Z860100 Personal history of colon polyps, unspecified: Secondary | ICD-10-CM

## 2024-07-02 NOTE — Telephone Encounter (Signed)
 No show pv appt. Left message that I ws trying to reach him for his appt.

## 2024-07-02 NOTE — Telephone Encounter (Signed)
 No answer, left message that he had missed his pre visit appt.  And that he needed to call back and reschedule the pre visit today or his colonoscopy on 07/15/24 would be cancelled

## 2024-07-02 NOTE — Progress Notes (Signed)
 No egg or soy allergy known to patient  No issues known to pt with past sedation with any surgeries or procedures Patient denies ever being told they had issues or difficulty with intubation  No FH of Malignant Hyperthermia Pt is not on diet pills Pt is not on  home 02  Pt is not on blood thinners  Pt denies issues with constipation  No A fib or A flutter Have any cardiac testing pending--no Pt can ambulate independently Pt denies use of chewing tobacco Discussed diabetic I weight loss medication holds Discussed NSAID holds Checked BMI Pt instructed to use Single[employer]com or GoodRx for a price reduction on prep  Patient's chart reviewed by Jared Rhodes CNRA prior to previsit and patient appropriate for the [facility].  Pre visit completed and red dot placed by patient's name on their procedure day (on provider's schedule).

## 2024-07-13 ENCOUNTER — Telehealth: Payer: Self-pay | Admitting: Gastroenterology

## 2024-07-13 NOTE — Telephone Encounter (Signed)
 Understood - thank you  - Dr. Legrand

## 2024-07-13 NOTE — Telephone Encounter (Signed)
 Good morning Dr. Legrand  I received a call from this patient wishing to cancel his procedure for tomorrow at 10:30 AM. Patient states he fell and broke his ankle and is now in a cast. Patient requested to reschedule for when he would be out of a cast and in a boot. Patient rescheduled for 8/7 at 1:00 PM.   Thank you.

## 2024-07-14 ENCOUNTER — Encounter: Admitting: Gastroenterology

## 2024-07-24 ENCOUNTER — Other Ambulatory Visit (INDEPENDENT_AMBULATORY_CARE_PROVIDER_SITE_OTHER): Payer: Self-pay | Admitting: Primary Care

## 2024-07-24 DIAGNOSIS — I10 Essential (primary) hypertension: Secondary | ICD-10-CM

## 2024-07-24 NOTE — Telephone Encounter (Unsigned)
 Copied from CRM [id number]. Topic: Clinical - Medication Refill >> Jul 24, 2024  3:58 PM Jared Rhodes wrote: Medication: amLODipine  (NORVASC ) 10 MG tablet  Has the patient contacted their pharmacy? Yes (Agent: If no, request that the patient contact the pharmacy for the refill. If patient does not wish to contact the pharmacy document the reason why and proceed with request.) (Agent: If yes, when and what did the pharmacy advise?)  This is the patient's preferred pharmacy:  [facility] STORE #[id number] [place] - [address] AT [facility] [address] [place] [address] Phone: [phone] Fax: [phone]   Is this the correct pharmacy for this prescription? Yes If no, delete pharmacy and type the correct one.   Has the prescription been filled recently? No  Is the patient out of the medication? Yes  Has the patient been seen for an appointment in the last year OR does the patient have an upcoming appointment? Yes  Can we respond through MyChart? No  Agent: Please be advised that Rx refills may take up to 3 business days. We ask that you follow-up with your pharmacy.

## 2024-07-27 ENCOUNTER — Telehealth: Payer: Self-pay | Admitting: *Deleted

## 2024-07-27 ENCOUNTER — Other Ambulatory Visit (INDEPENDENT_AMBULATORY_CARE_PROVIDER_SITE_OTHER): Payer: Self-pay

## 2024-07-27 DIAGNOSIS — I10 Essential (primary) hypertension: Secondary | ICD-10-CM

## 2024-07-27 MED ORDER — AMLODIPINE BESYLATE 10 MG PO TABS: 10.0000 mg | ORAL_TABLET | Freq: Every day | ORAL | 0 refills | Status: DC

## 2024-07-27 NOTE — Telephone Encounter (Signed)
 Copied from CRM [id number]. Topic: Clinical - Prescription Issue >> Jul 24, 2024  3:59 PM Ivette P wrote: Reason for CRM: [facility] called in for pt stating pt has been contacting them for a refill since 07/21/2024 and [facility] has sent fax over to get medication amLODipine  (NORVASC ) 10 MG tablet refilled.    Pt would like to know if can be sent before weekend. As pt is out of medication.

## 2024-07-28 NOTE — Telephone Encounter (Signed)
Pre visit complete °

## 2024-07-29 ENCOUNTER — Telehealth (INDEPENDENT_AMBULATORY_CARE_PROVIDER_SITE_OTHER): Payer: Self-pay | Admitting: Primary Care

## 2024-07-29 DIAGNOSIS — I10 Essential (primary) hypertension: Secondary | ICD-10-CM

## 2024-07-29 NOTE — Telephone Encounter (Unsigned)
 Copied from CRM #[id number]. Topic: Clinical - Medication Refill >> Jul 29, 2024 10:18 AM Turkey B wrote: Medication: amLODipine  (NORVASC ) 10 MG tablet  Has the patient contacted their pharmacy? yes (Agent: If yes, when and what did the pharmacy advise?)contact pcp Pt has upcomg appt now  This is the patient's preferred pharmacy:  [id number] [place] - [address] AT [facility] [address] [place] [address] Phone: [phone] Fax: [phone]   Is this the correct pharmacy for this prescription? yes    Has the prescription been filled recently? no  Is the patient out of the medication? yes  Has the patient been seen for an appointment in the last year OR does the patient have an upcoming appointment? yes  Can we respond through MyChart? no  Agent: Please be advised that Rx refills may take up to 3 business days. We ask that you follow-up with your pharmacy.

## 2024-07-30 ENCOUNTER — Ambulatory Visit (AMBULATORY_SURGERY_CENTER): Admitting: Gastroenterology

## 2024-07-30 ENCOUNTER — Encounter: Payer: Self-pay | Admitting: Gastroenterology

## 2024-07-30 VITALS — BP 124/67 | HR 54 | Temp 97.9°F | Resp 14 | Ht 68.0 in | Wt 214.0 lb

## 2024-07-30 DIAGNOSIS — K5730 Diverticulosis of large intestine without perforation or abscess without bleeding: Secondary | ICD-10-CM | POA: Diagnosis not present

## 2024-07-30 DIAGNOSIS — K648 Other hemorrhoids: Secondary | ICD-10-CM

## 2024-07-30 DIAGNOSIS — Z860100 Personal history of colon polyps, unspecified: Secondary | ICD-10-CM

## 2024-07-30 DIAGNOSIS — Z860101 Personal history of adenomatous and serrated colon polyps: Secondary | ICD-10-CM

## 2024-07-30 DIAGNOSIS — D175 Benign lipomatous neoplasm of intra-abdominal organs: Secondary | ICD-10-CM

## 2024-07-30 DIAGNOSIS — Z1211 Encounter for screening for malignant neoplasm of colon: Secondary | ICD-10-CM

## 2024-07-30 MED ORDER — SODIUM CHLORIDE 0.9 % IV SOLN: 500.0000 mL | Freq: Once | INTRAVENOUS | Status: DC

## 2024-07-30 NOTE — Progress Notes (Signed)
 Pt's states no medical or surgical changes since previsit or office visit.

## 2024-07-30 NOTE — Progress Notes (Signed)
 History and Physical:  This patient presents for endoscopic testing for: Encounter Diagnosis  Name Primary?   History of colonic polyps Yes    Surveillance colonoscopy today for Hx colon polyps Diminutive TA last colonoscopy Feb 2019 Patient denies chronic abdominal pain, rectal bleeding, constipation or diarrhea.   Patient is otherwise without complaints or active issues today.   Past Medical History: Past Medical History:  Diagnosis Date   Hypertension    Prostate cancer (HCC) 2018   radation 40 treatments      Past Surgical History: Past Surgical History:  Procedure Laterality Date   COLONOSCOPY     I & D EXTREMITY Left 07/17/2014   Procedure: IRRIGATION AND DEBRIDEMENT EXTREMITY;  Surgeon: Kay Ozell Cummins, MD;  Location: [facility] OR;  Service: Orthopedics;  Laterality: Left;   I & D EXTREMITY Left 07/19/2014   Procedure: IRRIGATION AND DEBRIDEMENT EXTREMITY;  Surgeon: Kay Ozell Cummins, MD;  Location: [facility] OR;  Service: Orthopedics;  Laterality: Left;   INCISIONAL HERNIA REPAIR N/A 04/30/2024   Procedure: REPAIR, HERNIA, INCISIONAL;  Surgeon: Vernetta Berg, MD;  Location: [facility] OR;  Service: General;  Laterality: N/A;   INSERTION OF MESH N/A 04/30/2024   Procedure: INSERTION OF MESH;  Surgeon: Vernetta Berg, MD;  Location: [facility] OR;  Service: General;  Laterality: N/A;   LYMPHADENECTOMY Bilateral 09/25/2021   Procedure: REDGIE, PELVIC;  Surgeon: Renda Glance, MD;  Location: [facility] ORS;  Service: Urology;  Laterality: Bilateral;   ORIF ANKLE FRACTURE Left 07/19/2014   Procedure: OPEN REDUCTION INTERNAL FIXATION (ORIF) ANKLE FRACTURE;  Surgeon: Kay Ozell Cummins, MD;  Location: [facility] OR;  Service: Orthopedics;  Laterality: Left;   PROSTATE BIOPSY     ROBOT ASSISTED LAPAROSCOPIC RADICAL PROSTATECTOMY N/A 09/25/2021   Procedure: XI ROBOTIC ASSISTED LAPAROSCOPIC RADICAL PROSTATECTOMY LEVEL 3;  Surgeon: Renda Glance, MD;  Location: [facility] ORS;  Service: Urology;  Laterality: N/A;    TONSILLECTOMY     TOTAL HIP ARTHROPLASTY Right 03/02/2016   Procedure: RIGHT TOTAL HIP ARTHROPLASTY ANTERIOR APPROACH;  Surgeon: Kay CHRISTELLA Cummins, MD;  Location: [facility] OR;  Service: Orthopedics;  Laterality: Right;    Allergies: Allergies  Allergen Reactions   Penicillins Rash    Outpatient Meds: Current Outpatient Medications  Medication Sig Dispense Refill   acetaminophen  (TYLENOL ) 325 MG tablet Take 650 mg by mouth daily as needed for moderate pain (pain score 4-6) or mild pain (pain score 1-3).     amLODipine  (NORVASC ) 10 MG tablet Take 1 tablet (10 mg total) by mouth daily. 30 tablet 0   oxyCODONE  (OXY IR/ROXICODONE ) 5 MG immediate release tablet Take 1 tablet (5 mg total) by mouth every 6 (six) hours as needed for moderate pain (pain score 4-6) or severe pain (pain score 7-10). (Patient not taking: Reported on 07/02/2024) 25 tablet 0   solifenacin (VESICARE) 5 MG tablet Take 5 mg by mouth daily.     Current Facility-Administered Medications  Medication Dose Route Frequency Provider Last Rate Last Admin   0.9 %  sodium chloride  infusion  500 mL Intravenous Once Danis, Victory CROME III, MD          ___________________________________________________________________ Objective   Exam:  BP (!) 143/87   Pulse 68   Temp 97.9 F (36.6 C) (Temporal)   Ht 5' 8 (1.727 m)   Wt 214 lb (97.1 kg)   SpO2 97%   BMI 32.54 kg/m   CV: regular , S1/S2 Resp: clear to auscultation bilaterally, normal RR and effort noted GI: soft, no tenderness,  with active bowel sounds.   Assessment: Encounter Diagnosis  Name Primary?   History of colonic polyps Yes     Plan: Colonoscopy   The benefits and risks of the planned procedure(s) were described in detail with the patient or (when appropriate) their health care proxy.  Risks were outlined as including, but not limited to, bleeding, infection, perforation, adverse medication reaction leading to cardiac or pulmonary decompensation, pancreatitis  (if ERCP).  The limitation of incomplete mucosal visualization was also discussed.  No guarantees or warranties were given.  The patient is appropriate for an endoscopic procedure in the ambulatory setting.   - Victory Brand, MD

## 2024-07-30 NOTE — Patient Instructions (Addendum)
 Continue present medications. No repeat surveillance colonoscopy due to age, current guidelines and low risk findings today.                            YOU HAD AN ENDOSCOPIC PROCEDURE TODAY AT THE [facility]:   Refer to the procedure report that was given to you for any specific questions about what was found during the examination.  If the procedure report does not answer your questions, please call your gastroenterologist to clarify.  If you requested that your care partner not be given the details of your procedure findings, then the procedure report has been included in a sealed envelope for you to review at your convenience later.  YOU SHOULD EXPECT: Some feelings of bloating in the abdomen. Passage of more gas than usual.  Walking can help get rid of the air that was put into your GI tract during the procedure and reduce the bloating. If you had a lower endoscopy (such as a colonoscopy or flexible sigmoidoscopy) you may notice spotting of blood in your stool or on the toilet paper. If you underwent a bowel prep for your procedure, you may not have a normal bowel movement for a few days.  Please Note:  You might notice some irritation and congestion in your nose or some drainage.  This is from the oxygen used during your procedure.  There is no need for concern and it should clear up in a day or so.  SYMPTOMS TO REPORT IMMEDIATELY:  Following lower endoscopy (colonoscopy or flexible sigmoidoscopy):  Excessive amounts of blood in the stool  Significant tenderness or worsening of abdominal pains  Swelling of the abdomen that is new, acute  Fever of 100F or higher  For urgent or emergent issues, a gastroenterologist can be reached at any hour by calling [phone]. Do not use MyChart messaging for urgent concerns.    DIET:  We do recommend a small meal at first, but then you may proceed to your regular diet.  Drink plenty of fluids but you should avoid alcoholic beverages  for 24 hours.  ACTIVITY:  You should plan to take it easy for the rest of today and you should NOT DRIVE or use heavy machinery until tomorrow (because of the sedation medicines used during the test).    FOLLOW UP: Our staff will call the number listed on your records the next business day following your procedure.  We will call around 7:15- 8:00 am to check on you and address any questions or concerns that you may have regarding the information given to you following your procedure. If we do not reach you, we will leave a message.      SIGNATURES/CONFIDENTIALITY: You and/or your care partner have signed paperwork which will be entered into your electronic medical record.  These signatures attest to the fact that that the information above on your After Visit Summary has been reviewed and is understood.  Full responsibility of the confidentiality of this discharge information lies with you and/or your care-partner.

## 2024-07-30 NOTE — Op Note (Signed)
 [facility] Patient Name: Jared Rhodes  Procedure Date: 07/30/2024 1:52 PM MRN: [id number] Endoscopist: Victory L. Legrand , MD, [id number] Age: 73 Referring MD:  Date of Birth: 1951-01-29 Gender: Male Account #: [id number] Procedure:                Colonoscopy Indications:              Surveillance: Personal history of adenomatous                            polyps on last colonoscopy > 5 years ago                           Diminutive tubular adenoma February 2019 Medicines:                Monitored Anesthesia Care Procedure:                Pre-Anesthesia Assessment:                           - Prior to the procedure, a History and Physical                            was performed, and patient medications and                            allergies were reviewed. The patient's tolerance of                            previous anesthesia was also reviewed. The risks                            and benefits of the procedure and the sedation                            options and risks were discussed with the patient.                            All questions were answered, and informed consent                            was obtained. Prior Anticoagulants: The patient has                            taken no anticoagulant or antiplatelet agents. ASA                            Grade Assessment: II - A patient with mild systemic                            disease. After reviewing the risks and benefits,                            the patient was deemed in satisfactory condition to  undergo the procedure.                           After obtaining informed consent, the colonoscope                            was passed under direct vision. Throughout the                            procedure, the patient's blood pressure, pulse, and                            oxygen saturations were monitored continuously. The                            CF [id number] #[id number] was  introduced through the anus                            and advanced to the the cecum, identified by                            appendiceal orifice and ileocecal valve. The                            colonoscopy was performed without difficulty. The                            patient tolerated the procedure well. The quality                            of the bowel preparation was good. The ileocecal                            valve, appendiceal orifice, and rectum were                            photographed. Scope In: 1:58:31 PM Scope Out: 2:13:56 PM Scope Withdrawal Time: 0 hours 10 minutes 1 second  Total Procedure Duration: 0 hours 15 minutes 25 seconds  Findings:                 The perianal and digital rectal examinations were                            normal.                           Repeat examination of right colon under NBI                            performed.                           There was a lipoma in the ascending colon.  Multiple small-mouthed diverticula were found from                            transverse colon to sigmoid colon.                           Internal hemorrhoids were found. The hemorrhoids                            were large.                           The exam was otherwise without abnormality on                            direct and retroflexion views. Complications:            No immediate complications. Estimated Blood Loss:     Estimated blood loss: none. Impression:               - Lipoma in the ascending colon.                           - Diverticulosis from transverse colon to sigmoid                            colon.                           - Internal hemorrhoids.                           - The examination was otherwise normal on direct                            and retroflexion views.                           - No specimens collected. Recommendation:           - Patient has a contact number available for                             emergencies. The signs and symptoms of potential                            delayed complications were discussed with the                            patient. Return to normal activities tomorrow.                            Written discharge instructions were provided to the                            patient.                           -  Resume previous diet.                           - Continue present medications.                           - No repeat surveillance colonoscopy due to age,                            current guidelines and low risk findings today. Sasuke Yaffe L. Legrand, MD 07/30/2024 2:18:16 PM This report has been signed electronically.

## 2024-07-30 NOTE — Progress Notes (Signed)
 Vss nad trans to pacu

## 2024-07-31 ENCOUNTER — Telehealth: Payer: Self-pay

## 2024-07-31 NOTE — Telephone Encounter (Signed)
 Duplicate request, [facility] 07/27/24 for 30 days.  Requested Prescriptions  Pending Prescriptions Disp Refills   amLODipine  (NORVASC ) 10 MG tablet 30 tablet 0    Sig: Take 1 tablet (10 mg total) by mouth daily.     Cardiovascular: Calcium Channel Blockers 2 Failed - 07/31/2024  8:45 AM      Failed - Valid encounter within last 6 months    Recent Outpatient Visits           7 months ago Screening for colon cancer   [place] [facility] [name], NP   1 year ago Acute bilateral low back pain without sciatica   [place] [facility] [name], NP   2 years ago Primary hypertension   [place] [facility] [name], NP   2 years ago Impacted cerumen of left ear   [place] [facility] [name], NP   2 years ago Encounter for Medicare annual wellness exam   [place] [facility] [name], NP              Passed - Last BP in normal range    BP Readings from Last 1 Encounters:  07/30/24 124/67         Passed - Last Heart Rate in normal range    Pulse Readings from Last 1 Encounters:  07/30/24 (!) 54

## 2024-07-31 NOTE — Telephone Encounter (Signed)
 Left message on answering machine.

## 2024-08-17 ENCOUNTER — Ambulatory Visit (INDEPENDENT_AMBULATORY_CARE_PROVIDER_SITE_OTHER): Admitting: Primary Care

## 2024-09-22 DIAGNOSIS — I10 Essential (primary) hypertension: Secondary | ICD-10-CM | POA: Diagnosis not present

## 2024-09-22 DIAGNOSIS — F17210 Nicotine dependence, cigarettes, uncomplicated: Secondary | ICD-10-CM | POA: Diagnosis not present

## 2024-09-22 DIAGNOSIS — Z0000 Encounter for general adult medical examination without abnormal findings: Secondary | ICD-10-CM | POA: Diagnosis not present

## 2024-09-22 DIAGNOSIS — R011 Cardiac murmur, unspecified: Secondary | ICD-10-CM | POA: Diagnosis not present

## 2024-10-03 ENCOUNTER — Emergency Department (HOSPITAL_COMMUNITY)

## 2024-10-03 ENCOUNTER — Encounter (HOSPITAL_COMMUNITY): Payer: Self-pay | Admitting: Emergency Medicine

## 2024-10-03 ENCOUNTER — Other Ambulatory Visit: Payer: Self-pay

## 2024-10-03 ENCOUNTER — Observation Stay (HOSPITAL_COMMUNITY)
Admission: EM | Admit: 2024-10-03 | Discharge: 2024-10-05 | Disposition: A | Discharge status: Home / Self Care | Attending: Internal Medicine | Admitting: Internal Medicine

## 2024-10-03 DIAGNOSIS — Z9889 Other specified postprocedural states: Secondary | ICD-10-CM | POA: Diagnosis not present

## 2024-10-03 DIAGNOSIS — F17210 Nicotine dependence, cigarettes, uncomplicated: Secondary | ICD-10-CM | POA: Insufficient documentation

## 2024-10-03 DIAGNOSIS — F1090 Alcohol use, unspecified, uncomplicated: Secondary | ICD-10-CM | POA: Insufficient documentation

## 2024-10-03 DIAGNOSIS — I10 Essential (primary) hypertension: Secondary | ICD-10-CM | POA: Diagnosis not present

## 2024-10-03 DIAGNOSIS — K567 Ileus, unspecified: Principal | ICD-10-CM | POA: Diagnosis present

## 2024-10-03 DIAGNOSIS — R109 Unspecified abdominal pain: Secondary | ICD-10-CM | POA: Diagnosis present

## 2024-10-03 DIAGNOSIS — Z6841 Body Mass Index (BMI) 40.0 and over, adult: Secondary | ICD-10-CM | POA: Diagnosis not present

## 2024-10-03 DIAGNOSIS — Z79899 Other long term (current) drug therapy: Secondary | ICD-10-CM | POA: Insufficient documentation

## 2024-10-03 DIAGNOSIS — E860 Dehydration: Secondary | ICD-10-CM | POA: Diagnosis not present

## 2024-10-03 DIAGNOSIS — R1084 Generalized abdominal pain: Principal | ICD-10-CM

## 2024-10-03 DIAGNOSIS — K432 Incisional hernia without obstruction or gangrene: Secondary | ICD-10-CM | POA: Diagnosis present

## 2024-10-03 LAB — URINALYSIS, ROUTINE W REFLEX MICROSCOPIC
Bacteria, UA: NONE SEEN
Bilirubin Urine: NEGATIVE
Glucose, UA: NEGATIVE mg/dL
Hgb urine dipstick: NEGATIVE
Ketones, ur: NEGATIVE mg/dL
Leukocytes,Ua: NEGATIVE
Nitrite: NEGATIVE
Protein, ur: 30 mg/dL — AB
Specific Gravity, Urine: 1.036 — ABNORMAL HIGH (ref 1.005–1.030)
pH: 5 (ref 5.0–8.0)

## 2024-10-03 LAB — COMPREHENSIVE METABOLIC PANEL WITH GFR
ALT: 8 U/L (ref 0–44)
AST: 19 U/L (ref 15–41)
Albumin: 3.7 g/dL (ref 3.5–5.0)
Alkaline Phosphatase: 58 U/L (ref 38–126)
Anion gap: 13 (ref 5–15)
BUN: 15 mg/dL (ref 8–23)
CO2: 20 mmol/L — ABNORMAL LOW (ref 22–32)
Calcium: 9.2 mg/dL (ref 8.9–10.3)
Chloride: 106 mmol/L (ref 98–111)
Creatinine, Ser: 0.88 mg/dL (ref 0.61–1.24)
GFR, Estimated: >60 mL/min (ref >60)
Glucose, Bld: 113 mg/dL — ABNORMAL HIGH (ref 70–99)
Potassium: 3.5 mmol/L (ref 3.5–5.1)
Sodium: 138 mmol/L (ref 135–145)
Total Bilirubin: 0.5 mg/dL (ref 0.0–1.2)
Total Protein: 7.4 g/dL (ref 6.5–8.1)

## 2024-10-03 LAB — CBC
HCT: 40.7 % (ref 39.0–52.0)
Hemoglobin: 12.9 g/dL — ABNORMAL LOW (ref 13.0–17.0)
MCH: 28.5 pg (ref 26.0–34.0)
MCHC: 31.7 g/dL (ref 30.0–36.0)
MCV: 90 fL (ref 80.0–100.0)
Platelets: 227 K/uL (ref 150–400)
RBC: 4.52 MIL/uL (ref 4.22–5.81)
RDW: 14.7 % (ref 11.5–15.5)
WBC: 5.3 K/uL (ref 4.0–10.5)
nRBC: 0 % (ref 0.0–0.2)

## 2024-10-03 LAB — LIPASE, BLOOD: Lipase: 33 U/L (ref 11–51)

## 2024-10-03 LAB — MAGNESIUM: Magnesium: 2.2 mg/dL (ref 1.7–2.4)

## 2024-10-03 MED ORDER — ENOXAPARIN SODIUM 40 MG/0.4ML IJ SOSY
40.0000 mg | PREFILLED_SYRINGE | INTRAMUSCULAR | Status: DC
  Administered 2024-10-03 – 2024-10-04 (×2): 40 mg via SUBCUTANEOUS
  Filled 2024-10-03 (×2): qty 0.4

## 2024-10-03 MED ORDER — SODIUM CHLORIDE 0.9% FLUSH: 3.0000 mL | Freq: Two times a day (BID) | INTRAVENOUS | Status: DC

## 2024-10-03 MED ORDER — KETOROLAC TROMETHAMINE 15 MG/ML IJ SOLN: 15.0000 mg | Freq: Four times a day (QID) | INTRAMUSCULAR | Status: DC | PRN

## 2024-10-03 MED ORDER — ONDANSETRON HCL 4 MG/2ML IJ SOLN: 4.0000 mg | Freq: Four times a day (QID) | INTRAMUSCULAR | Status: DC | PRN

## 2024-10-03 MED ORDER — ACETAMINOPHEN 325 MG PO TABS
650.0000 mg | ORAL_TABLET | Freq: Once | ORAL | Status: AC
Start: 2024-10-03 — End: 2024-10-03
  Administered 2024-10-03: 650 mg via ORAL
  Filled 2024-10-03: qty 2

## 2024-10-03 MED ORDER — ACETAMINOPHEN 650 MG RE SUPP: 650.0000 mg | Freq: Four times a day (QID) | RECTAL | Status: DC | PRN

## 2024-10-03 MED ORDER — ACETAMINOPHEN 325 MG PO TABS
650.0000 mg | ORAL_TABLET | Freq: Four times a day (QID) | ORAL | Status: DC | PRN
  Administered 2024-10-04: 650 mg via ORAL
  Filled 2024-10-03: qty 2

## 2024-10-03 MED ORDER — SODIUM CHLORIDE 0.9 % IV SOLN: 250.0000 mL | INTRAVENOUS | Status: AC | PRN

## 2024-10-03 MED ORDER — METOCLOPRAMIDE HCL 5 MG/ML IJ SOLN
10.0000 mg | Freq: Four times a day (QID) | INTRAMUSCULAR | Status: AC
Start: 2024-10-03 — End: 2024-10-05
  Administered 2024-10-03 – 2024-10-05 (×6): 10 mg via INTRAVENOUS
  Filled 2024-10-03 (×6): qty 2

## 2024-10-03 MED ORDER — SODIUM CHLORIDE 0.9% FLUSH: 3.0000 mL | INTRAVENOUS | Status: DC | PRN

## 2024-10-03 MED ORDER — LACTATED RINGERS IV BOLUS
500.0000 mL | Freq: Once | INTRAVENOUS | Status: AC
  Administered 2024-10-03: 500 mL via INTRAVENOUS

## 2024-10-03 MED ORDER — POTASSIUM CHLORIDE 2 MEQ/ML IV SOLN
INTRAVENOUS | Status: DC
  Filled 2024-10-03 (×2): qty 1000

## 2024-10-03 MED ORDER — ONDANSETRON HCL 4 MG PO TABS: 4.0000 mg | ORAL_TABLET | Freq: Four times a day (QID) | ORAL | Status: DC | PRN

## 2024-10-03 MED ORDER — TRAZODONE HCL 50 MG PO TABS
25.0000 mg | ORAL_TABLET | Freq: Every evening | ORAL | Status: DC | PRN
  Filled 2024-10-03: qty 1

## 2024-10-03 MED ORDER — IOHEXOL 300 MG/ML  SOLN
100.0000 mL | Freq: Once | INTRAMUSCULAR | Status: AC | PRN
  Administered 2024-10-03: 100 mL via INTRAVENOUS

## 2024-10-03 MED ORDER — MORPHINE SULFATE (PF) 2 MG/ML IV SOLN: 2.0000 mg | INTRAVENOUS | Status: DC | PRN

## 2024-10-03 NOTE — ED Triage Notes (Signed)
 Pt reports abdominal pain and vomiting that started yesterday. Pt had hernia surgery a couple months ago. Denies fevers.

## 2024-10-03 NOTE — ED Provider Notes (Signed)
 Jared Rhodes EMERGENCY DEPARTMENT AT [facility] Provider Note   CSN: [id number] Arrival date & time: 10/03/24  1147     Patient presents with: Abdominal Pain   Jared Rhodes  [name] is a 73 y.o. male.    Abdominal Pain Associated symptoms: nausea and vomiting   Patient presents for abdominal pain and vomiting.  Medical history includes alcohol abuse, arthritis, SBO, HTN, prostate cancer.  In May, he had hernia repair with Dr. Vernetta.  He was subsequently hospitalized twice that month for small bowel obstruction.  During these hospitalizations, SBO was managed conservatively.  Starting yesterday, he had recurrence of abdominal pain, nausea, vomiting.  He does note some abdominal distention.  His symptoms are reminiscent of prior SBO.  He did have a bowel movement this morning which was watery.  His current pain is 5/10 in severity.      Prior to Admission medications   Medication Sig Start Date End Date Taking? Authorizing Provider  acetaminophen  (TYLENOL ) 325 MG tablet Take 650 mg by mouth daily as needed for moderate pain (pain score 4-6) or mild pain (pain score 1-3).    [provider]  amLODipine  (NORVASC ) 10 MG tablet Take 1 tablet (10 mg total) by mouth daily. 07/27/24   [name], NP  oxyCODONE  (OXY IR/ROXICODONE ) 5 MG immediate release tablet Take 1 tablet (5 mg total) by mouth every 6 (six) hours as needed for moderate pain (pain score 4-6) or severe pain (pain score 7-10). Patient not taking: No sig reported 05/01/24   Vernetta Berg, MD  solifenacin (VESICARE) 5 MG tablet Take 5 mg by mouth daily. 02/05/24   [provider]    Allergies: Penicillins    Review of Systems  Gastrointestinal:  Positive for abdominal distention, abdominal pain, nausea and vomiting.  All other systems reviewed and are negative.   Updated Vital Signs BP 119/80   Pulse 71   Temp 98.3 F (36.8 C) (Oral)   Resp 16   SpO2 100%   Physical Exam Vitals  and nursing note reviewed.  Constitutional:      General: He is not in acute distress.    Appearance: He is well-developed. He is not ill-appearing, toxic-appearing or diaphoretic.  HENT:     Head: Normocephalic and atraumatic.     Mouth/Throat:     Mouth: Mucous membranes are moist.  Eyes:     Conjunctiva/sclera: Conjunctivae normal.  Cardiovascular:     Rate and Rhythm: Normal rate and regular rhythm.  Pulmonary:     Effort: Pulmonary effort is normal. No respiratory distress.  Abdominal:     Palpations: Abdomen is soft.     Tenderness: There is generalized abdominal tenderness.  Musculoskeletal:        General: No swelling.     Cervical back: Neck supple.  Skin:    General: Skin is warm and dry.     Coloration: Skin is not cyanotic, jaundiced or pale.  Neurological:     General: No focal deficit present.     Mental Status: He is alert and oriented to person, place, and time.  Psychiatric:        Mood and Affect: Mood normal.        Behavior: Behavior normal.     (all labs ordered are listed, but only abnormal results are displayed) Labs Reviewed  COMPREHENSIVE METABOLIC PANEL WITH GFR - Abnormal; Notable for the following components:      Result Value   CO2 20 (*)  Glucose, Bld 113 (*)    All other components within normal limits  CBC - Abnormal; Notable for the following components:   Hemoglobin 12.9 (*)    All other components within normal limits  URINALYSIS, ROUTINE W REFLEX MICROSCOPIC - Abnormal; Notable for the following components:   Specific Gravity, Urine 1.036 (*)    Protein, ur 30 (*)    All other components within normal limits  LIPASE, BLOOD  MAGNESIUM     EKG: None  Radiology: CT ABDOMEN PELVIS W CONTRAST Result Date: 10/03/2024 CLINICAL DATA:  One day history of abdominal pain and vomiting. History of incisional hernia repair 04/30/2024. EXAM: CT ABDOMEN AND PELVIS WITH CONTRAST TECHNIQUE: Multidetector CT imaging of the abdomen and pelvis  was performed using the standard protocol following bolus administration of intravenous contrast. RADIATION DOSE REDUCTION: This exam was performed according to the departmental dose-optimization program which includes automated exposure control, adjustment of the mA and/or kV according to patient size and/or use of iterative reconstruction technique. CONTRAST:  OMNIPAQUE  IOHEXOL  300 MG/ML  SOLN COMPARISON:  CT abdomen and pelvis dated 05/20/2024 FINDINGS: Lower chest: No focal consolidation or pulmonary nodule in the lung bases. No pleural effusion or pneumothorax demonstrated. Partially imaged heart size is normal. Dilated ascending thoracic aorta measures 4.0 cm. Hepatobiliary: No focal hepatic lesions. No intra or extrahepatic biliary ductal dilation. Normal gallbladder. Pancreas: No focal lesions or main ductal dilation. Spleen: Normal in size without focal abnormality. Adrenals/Urinary Tract: No adrenal nodules. No suspicious renal mass, calculi or hydronephrosis. Underdistended urinary bladder contains a small amount of excreted contrast material. Stomach/Bowel: Normal appearance of the stomach. Segmental loops of dilated small bowel without areas of abrupt caliber narrowing. Mild mural thickening of lower midline small bowel loops. Appendix is not discretely seen. Vascular/Lymphatic: Aortic atherosclerosis. No enlarged abdominal or pelvic lymph nodes. Reproductive: Prostatectomy. Other: No free fluid, fluid collection, or free air. Small area of irregular soft tissue thickening deep to the umbilicus (12:60). Musculoskeletal: No acute or abnormal lytic or blastic osseous lesions. Status post right hip arthroplasty. Postsurgical changes of the anterior abdominal wall. Multilevel degenerative changes of the partially imaged thoracic and lumbar spine. Unchanged vertebral body height loss of T12, L2, and L4. IMPRESSION: 1. Segmental loops of dilated small bowel without areas of abrupt caliber narrowing,  likely ileus. Mild mural thickening of lower midline small bowel loops, which may represent enteritis. 2. Small area of irregular soft tissue thickening deep to the umbilicus, likely postsurgical. 3. Dilated ascending thoracic aorta measures 4.0 cm. Recommend annual imaging followup by CTA or MRA. This recommendation follows 2010 ACCF/AHA/AATS/ACR/ASA/SCA/SCAI/SIR/STS/SVM Guidelines for the Diagnosis and Management of Patients with Thoracic Aortic Disease. Circulation. 2010; 121: Z733-z630. Aortic aneurysm NOS (ICD10-I71.9) 4.  Aortic Atherosclerosis (ICD10-I70.0). Electronically Signed   By: Limin  Xu M.D.   On: 10/03/2024 15:56     Procedures   Medications Ordered in the ED  acetaminophen  (TYLENOL ) tablet 650 mg (650 mg Oral Given 10/03/24 1625)  lactated ringers  bolus 500 mL (500 mLs Intravenous New Bag/Given 10/03/24 1629)  iohexol  (OMNIPAQUE ) 300 MG/ML solution 100 mL (100 mLs Intravenous Contrast Given 10/03/24 1358)                                    Medical Decision Making Amount and/or Complexity of Data Reviewed Labs: ordered. Radiology: ordered.  Risk OTC drugs. Prescription drug management.   This patient presents to the ED  for concern of abdominal pain, this involves an extensive number of treatment options, and is a complaint that carries with it a high risk of complications and morbidity.  The differential diagnosis includes enteritis, colitis, bowel obstruction, constipation, intra-abdominal infection   Co morbidities / Chronic conditions that complicate the patient evaluation  alcohol abuse, arthritis, SBO, HTN, prostate cancer   Additional history obtained:  Additional history obtained from EMR External records from outside source obtained and reviewed including N/A   Lab Tests:  I Ordered, and personally interpreted labs.  The pertinent results include: Normal hemoglobin, no leukocytosis, normal kidney function, normal electrolytes   Imaging Studies  ordered:  I ordered imaging studies including CT of abdomen and pelvis I independently visualized and interpreted imaging which showed dilated loops of small bowel without clear transition point.  Mural thickening of small bowel loops suggestive of enteritis. I agree with the radiologist interpretation   Cardiac Monitoring: / EKG:  The patient was maintained on a cardiac monitor.  I personally viewed and interpreted the cardiac monitored which showed an underlying rhythm of: Sinus rhythm   Problem List / ED Course / Critical interventions / Medication management  Patient presenting for generalized abdominal pain, nausea, vomiting.  He does have a recent history of hernia repair followed by episodes of small bowel obstruction.  Onset of current symptoms was yesterday.  On arrival in the ED, his vital signs are normal.  On exam, he is well-appearing.  He does have some mild generalized abdominal tenderness.  He declines any strong pain medication at this time but does request Tylenol .  This was ordered.  His lab work shows normal kidney function, normal electrolytes, normal hepatobiliary enzymes, and no leukocytosis.  CT imaging was ordered.  CT scan showed some segmental loops of dilated small bowel without clear evidence of abrupt transition points.  On reassessment, patient endorses ongoing abdominal pain.  He does state that he continues to pass gas.  Shared decision making discussion was had with patient, who feels more comfortable with admission for observation.  Patient was admitted for further management. I ordered medication including Tylenol  for analgesia, IV fluids for hydration Reevaluation of the patient after these medicines showed that the patient improved I have reviewed the patients home medicines and have made adjustments as needed  Social Determinants of Health:  Lives independently     Final diagnoses:  Generalized abdominal pain    ED Discharge Orders     None           Melvenia Motto, MD 10/03/24 1807

## 2024-10-03 NOTE — H&P (Signed)
 History and Physical    Jared Rhodes FMW:[id number] DOB: 02-Jul-1951 DOA: 10/03/2024  PCP: [name], NP  Patient coming from: Home  Chief Complaint: Abdominal pain nausea and vomiting  HPI: Jared Rhodes  [name] is a 73 y.o. male with medical history significant of hypertension, arthritis, incisional hernia repair in May with subsequent small bowel obstruction x 2 both medically managed, prostate cancer who presents with complaints of abdominal pain, nausea and vomiting.  Pain began yesterday and nausea and vomiting followed.  Patient states he has some abdominal distention's.  His symptoms are similar to his prior small bowel obstruction.  He states that he ate eggs grits and bacon this morning and did not have any nausea or vomiting.  Was able to hold them down.  He also had a bowel movement this morning which was watery.  In the emergency department he was given Tylenol  for pain.  He states that he does not want anything stronger.  We agreed on Tylenol  for mild pain, ketorolac  for moderate pain and morphine  for severe pain.    ED Course: In the emergency department laboratory workup was reassuring and CT scan showed omental loops of dilated small bowel without areas of abrupt caliber narrowing.  Likely ileus.  Mild mural thickening over lower midline small bowel loops which may represent enteritis.  Small area of irregular soft tissue thickening deep to the umbilicus likely postsurgical.  Dilated ascending thoracic aorta measures 4.0 cm.  Annual follow-up by CTA or MRA recommended.  Review of Systems: As per HPI otherwise all other systems reviewed and  negative.   Past Medical History:  Diagnosis Date   Hypertension    Prostate cancer (HCC) 2018   radation 40 treatments     Past Surgical History:  Procedure Laterality Date   COLONOSCOPY     I & D EXTREMITY Left 07/17/2014   Procedure: IRRIGATION AND DEBRIDEMENT EXTREMITY;  Surgeon: Kay Ozell Cummins, MD;  Location:  [facility] OR;  Service: Orthopedics;  Laterality: Left;   I & D EXTREMITY Left 07/19/2014   Procedure: IRRIGATION AND DEBRIDEMENT EXTREMITY;  Surgeon: Kay Ozell Cummins, MD;  Location: [facility] OR;  Service: Orthopedics;  Laterality: Left;   INCISIONAL HERNIA REPAIR N/A 04/30/2024   Procedure: REPAIR, HERNIA, INCISIONAL;  Surgeon: Vernetta Berg, MD;  Location: [facility] OR;  Service: General;  Laterality: N/A;   INSERTION OF MESH N/A 04/30/2024   Procedure: INSERTION OF MESH;  Surgeon: Vernetta Berg, MD;  Location: [facility] OR;  Service: General;  Laterality: N/A;   LYMPHADENECTOMY Bilateral 09/25/2021   Procedure: REDGIE, PELVIC;  Surgeon: Renda Glance, MD;  Location: [facility] ORS;  Service: Urology;  Laterality: Bilateral;   ORIF ANKLE FRACTURE Left 07/19/2014   Procedure: OPEN REDUCTION INTERNAL FIXATION (ORIF) ANKLE FRACTURE;  Surgeon: Kay Ozell Cummins, MD;  Location: [facility] OR;  Service: Orthopedics;  Laterality: Left;   PROSTATE BIOPSY     ROBOT ASSISTED LAPAROSCOPIC RADICAL PROSTATECTOMY N/A 09/25/2021   Procedure: XI ROBOTIC ASSISTED LAPAROSCOPIC RADICAL PROSTATECTOMY LEVEL 3;  Surgeon: Renda Glance, MD;  Location: [facility] ORS;  Service: Urology;  Laterality: N/A;   TONSILLECTOMY     TOTAL HIP ARTHROPLASTY Right 03/02/2016   Procedure: RIGHT TOTAL HIP ARTHROPLASTY ANTERIOR APPROACH;  Surgeon: Kay CHRISTELLA Cummins, MD;  Location: [facility] OR;  Service: Orthopedics;  Laterality: Right;    Social History   Social History Narrative   Lives alone. 2 brothers and 1 sister live in [place].  1 half-sister lives in [place].     reports  that he has been smoking cigarettes. He has a 30 pack-year smoking history. He has never used smokeless tobacco. He reports current alcohol use of about 6.0 standard drinks of alcohol per week. He reports that he does not use drugs.  Allergies  Allergen Reactions   Penicillins Rash    Family History  Problem Relation Age of Onset   Alcohol abuse Mother    Heart disease Sister     Hyperlipidemia Brother    Colon cancer Neg Hx    Breast cancer Neg Hx    Prostate cancer Neg Hx    Pancreatic cancer Neg Hx    Colon polyps Neg Hx    Esophageal cancer Neg Hx    Rectal cancer Neg Hx    Stomach cancer Neg Hx     Prior to Admission medications   Medication Sig Start Date End Date Taking? Authorizing Provider  acetaminophen  (TYLENOL ) 325 MG tablet Take 650 mg by mouth daily as needed for moderate pain (pain score 4-6) or mild pain (pain score 1-3).    [provider]  amLODipine  (NORVASC ) 10 MG tablet Take 1 tablet (10 mg total) by mouth daily. 07/27/24   [name], NP  oxyCODONE  (OXY IR/ROXICODONE ) 5 MG immediate release tablet Take 1 tablet (5 mg total) by mouth every 6 (six) hours as needed for moderate pain (pain score 4-6) or severe pain (pain score 7-10). Patient not taking: No sig reported 05/01/24   Vernetta Berg, MD  solifenacin (VESICARE) 5 MG tablet Take 5 mg by mouth daily. 02/05/24   [provider]    Physical Exam:  Constitutional: NAD, calm, mildly uncomfortable Vitals:   10/03/24 1200 10/03/24 1539  BP: 121/78 119/80  Pulse: 90 71  Resp: 16 16  Temp: 98.7 F (37.1 C) 98.3 F (36.8 C)  TempSrc:  Oral  SpO2: 100% 100%   Eyes: PERRL, lids and conjunctivae normal ENMT: Mucous membranes are moist. Posterior pharynx clear of any exudate or lesions.Normal dentition.  Neck: normal, supple, no masses, no thyromegaly Respiratory: clear to auscultation bilaterally, no wheezing, no crackles. Normal respiratory effort. No accessory muscle use.  Cardiovascular: Regular rate and rhythm, no murmurs / rubs / gallops. No extremity edema. 2+ pedal pulses. No carotid bruits.  Abdomen: Mild tenderness, minimal distention, no masses palpated. No hepatosplenomegaly. Bowel sounds positive.  Soft Musculoskeletal: no clubbing / cyanosis. No joint deformity upper and lower extremities. Good ROM, no contractures. Normal muscle tone.  Skin: no  rashes, lesions, ulcers. No induration Neurologic: CN 2-12 grossly intact. Sensation intact, DTR normal. Strength 5/5 in all 4.  Psychiatric: Normal judgment and insight. Alert and oriented x 3. Normal mood.    Labs on Admission: I have personally reviewed following labs and imaging studies  CBC: Recent Labs  Lab 10/03/24 1221  WBC 5.3  HGB 12.9*  HCT 40.7  MCV 90.0  PLT 227   Basic Metabolic Panel: Recent Labs  Lab 10/03/24 1221  NA 138  K 3.5  CL 106  CO2 20*  GLUCOSE 113*  BUN 15  CREATININE 0.88  CALCIUM 9.2  MG 2.2   GFR: CrCl cannot be calculated (Unknown ideal weight.). Liver Function Tests: Recent Labs  Lab 10/03/24 1221  AST 19  ALT 8  ALKPHOS 58  BILITOT 0.5  PROT 7.4  ALBUMIN 3.7   Recent Labs  Lab 10/03/24 1221  LIPASE 33   Urine analysis:    Component Value Date/Time   COLORURINE YELLOW 10/03/2024 1222  APPEARANCEUR CLEAR 10/03/2024 1222   LABSPEC 1.036 (H) 10/03/2024 1222   PHURINE 5.0 10/03/2024 1222   GLUCOSEU NEGATIVE 10/03/2024 1222   HGBUR NEGATIVE 10/03/2024 1222   BILIRUBINUR NEGATIVE 10/03/2024 1222   KETONESUR NEGATIVE 10/03/2024 1222   PROTEINUR 30 (A) 10/03/2024 1222   UROBILINOGEN 0.2 07/17/2014 0328   NITRITE NEGATIVE 10/03/2024 1222   LEUKOCYTESUR NEGATIVE 10/03/2024 1222    Radiological Exams on Admission: CT ABDOMEN PELVIS W CONTRAST Result Date: 10/03/2024 CLINICAL DATA:  One day history of abdominal pain and vomiting. History of incisional hernia repair 04/30/2024. EXAM: CT ABDOMEN AND PELVIS WITH CONTRAST TECHNIQUE: Multidetector CT imaging of the abdomen and pelvis was performed using the standard protocol following bolus administration of intravenous contrast. RADIATION DOSE REDUCTION: This exam was performed according to the departmental dose-optimization program which includes automated exposure control, adjustment of the mA and/or kV according to patient size and/or use of iterative reconstruction technique.  CONTRAST:  OMNIPAQUE  IOHEXOL  300 MG/ML  SOLN COMPARISON:  CT abdomen and pelvis dated 05/20/2024 FINDINGS: Lower chest: No focal consolidation or pulmonary nodule in the lung bases. No pleural effusion or pneumothorax demonstrated. Partially imaged heart size is normal. Dilated ascending thoracic aorta measures 4.0 cm. Hepatobiliary: No focal hepatic lesions. No intra or extrahepatic biliary ductal dilation. Normal gallbladder. Pancreas: No focal lesions or main ductal dilation. Spleen: Normal in size without focal abnormality. Adrenals/Urinary Tract: No adrenal nodules. No suspicious renal mass, calculi or hydronephrosis. Underdistended urinary bladder contains a small amount of excreted contrast material. Stomach/Bowel: Normal appearance of the stomach. Segmental loops of dilated small bowel without areas of abrupt caliber narrowing. Mild mural thickening of lower midline small bowel loops. Appendix is not discretely seen. Vascular/Lymphatic: Aortic atherosclerosis. No enlarged abdominal or pelvic lymph nodes. Reproductive: Prostatectomy. Other: No free fluid, fluid collection, or free air. Small area of irregular soft tissue thickening deep to the umbilicus (12:60). Musculoskeletal: No acute or abnormal lytic or blastic osseous lesions. Status post right hip arthroplasty. Postsurgical changes of the anterior abdominal wall. Multilevel degenerative changes of the partially imaged thoracic and lumbar spine. Unchanged vertebral body height loss of T12, L2, and L4. IMPRESSION: 1. Segmental loops of dilated small bowel without areas of abrupt caliber narrowing, likely ileus. Mild mural thickening of lower midline small bowel loops, which may represent enteritis. 2. Small area of irregular soft tissue thickening deep to the umbilicus, likely postsurgical. 3. Dilated ascending thoracic aorta measures 4.0 cm. Recommend annual imaging followup by CTA or MRA. This recommendation follows 2010  ACCF/AHA/AATS/ACR/ASA/SCA/SCAI/SIR/STS/SVM Guidelines for the Diagnosis and Management of Patients with Thoracic Aortic Disease. Circulation. 2010; 121: Z733-z630. Aortic aneurysm NOS (ICD10-I71.9) 4.  Aortic Atherosclerosis (ICD10-I70.0). Electronically Signed   By: Limin  Xu M.D.   On: 10/03/2024 15:56      Assessment/Plan Principal Problem:   Ileus (HCC) Active Problems:   HTN (hypertension)   Severe obesity (BMI >= 40) (HCC)   Incisional hernia    1.  Ileus status post repair of incisional hernia: Recurring episodes of abdominal pain nausea and vomiting since repair of his incisional hernia in May.  CT scan and laboratory workup is reassuring. - Place in overnight observation - IV fluids - IV Reglan  every 6 hours for 6 doses - As needed ondansetron  - Tylenol  for mild pain, ketorolac  for moderate pain, morphine  for severe pain - Repeat labs in AM. - Monitor for bowel movement and tolerating p.o. in AM. - As he has been able to tolerate a diet today  I did order p.o. intake for him.  Bland diet.  2.  Hypertension: -Resume home medication  3.  Obesity: - Weight loss encouraged  4.  Incomplete database: Medication reconciliation pending at the time of this note.  DVT prophylaxis: Enoxaparin  Code Status: Full Family Communication: No family present at the time of admission.  Patient retains capacity Disposition Plan: Likely home Consults called: None Admission status: Observation   Zebedee JAYSON Fujisawa MD FACP [facility] Pager [phone]  How to contact the [facility] Attending or Consulting provider 7A - 7P or covering provider during after hours 7P -7A, for this patient?  Check the care team in [facility] and look for a) attending/consulting [facility] provider listed and b) the [facility] team listed Log into [email or web address] and use [place]'s universal password to access. If you do not have the password, please contact the hospital operator. Locate the [facility] provider you are looking for  under [facility] and page to a number that you can be directly reached. If you still have difficulty reaching the provider, please page the [facility] (Director on Call) for the Hospitalists listed on amion for assistance.  If 7PM-7AM, please contact night-coverage [email or web address] Password [facility]  10/03/2024, 6:13 PM

## 2024-10-03 NOTE — ED Notes (Signed)
Provided patient with a dinner tray 

## 2024-10-04 DIAGNOSIS — I10 Essential (primary) hypertension: Secondary | ICD-10-CM

## 2024-10-04 DIAGNOSIS — E860 Dehydration: Secondary | ICD-10-CM

## 2024-10-04 DIAGNOSIS — K432 Incisional hernia without obstruction or gangrene: Secondary | ICD-10-CM | POA: Diagnosis not present

## 2024-10-04 DIAGNOSIS — K567 Ileus, unspecified: Secondary | ICD-10-CM | POA: Diagnosis not present

## 2024-10-04 LAB — BASIC METABOLIC PANEL WITH GFR
Anion gap: 10 (ref 5–15)
BUN: 13 mg/dL (ref 8–23)
CO2: 20 mmol/L — ABNORMAL LOW (ref 22–32)
Calcium: 8.6 mg/dL — ABNORMAL LOW (ref 8.9–10.3)
Chloride: 108 mmol/L (ref 98–111)
Creatinine, Ser: 0.68 mg/dL (ref 0.61–1.24)
GFR, Estimated: >60 mL/min (ref >60)
Glucose, Bld: 81 mg/dL (ref 70–99)
Potassium: 4 mmol/L (ref 3.5–5.1)
Sodium: 138 mmol/L (ref 135–145)

## 2024-10-04 LAB — CBC
HCT: 36.4 % — ABNORMAL LOW (ref 39.0–52.0)
Hemoglobin: 11.9 g/dL — ABNORMAL LOW (ref 13.0–17.0)
MCH: 29.2 pg (ref 26.0–34.0)
MCHC: 32.7 g/dL (ref 30.0–36.0)
MCV: 89.4 fL (ref 80.0–100.0)
Platelets: 180 K/uL (ref 150–400)
RBC: 4.07 MIL/uL — ABNORMAL LOW (ref 4.22–5.81)
RDW: 14.6 % (ref 11.5–15.5)
WBC: 3.6 K/uL — ABNORMAL LOW (ref 4.0–10.5)
nRBC: 0 % (ref 0.0–0.2)

## 2024-10-04 LAB — TSH: TSH: 2.11 u[IU]/mL (ref 0.350–4.500)

## 2024-10-04 MED ORDER — LACTATED RINGERS IV SOLN: INTRAVENOUS | Status: AC

## 2024-10-04 MED ORDER — OXYBUTYNIN CHLORIDE 5 MG PO TABS
5.0000 mg | ORAL_TABLET | Freq: Every day | ORAL | Status: DC
  Administered 2024-10-04 – 2024-10-05 (×2): 5 mg via ORAL
  Filled 2024-10-04 (×2): qty 1

## 2024-10-04 NOTE — Progress Notes (Signed)
 PROGRESS NOTE    Jared Rhodes.  FMW:[id number] DOB: 1951/06/26 DOA: 10/03/2024 PCP: [name], NP   Chief Complaint  Patient presents with   Abdominal Pain    Brief Narrative:  Patient 73 year old gentleman history of hypertension, arthritis, incisional hernia repair in May with subsequent small bowel obstruction x 2 medically managed, prostate cancer presenting with abdominal pain nausea and vomiting.  CT abdomen and pelvis done concerning for ileus.  Patient being treated with supportive care and placed on scheduled Reglan .   Assessment & Plan:   Principal Problem:   Ileus (HCC) Active Problems:   HTN (hypertension)   Severe obesity (BMI >= 40) (HCC)   Incisional hernia  #1 ileus/history of incisional hernia repair -Patient with recurrent episodes of abdominal pain nausea vomiting since repair of incisional hernia in May 2025. - CT abdomen and pelvis obtained with segmental loops of dilated small bowel without areas of abrupt caliber narrowing, likely ileus.  Mild mural thickening of lower midline small bowel loops which may represent enteritis.  Small area of irregular soft tissue thickening deep to the umbilicus, likely postsurgical.  Dilated ascending thoracic aorta measuring 4.0 cm. - Patient with clinical improvement. - Continue IV fluids. - Patient placed on IV Reglan  every 6 hours x 6 doses. -Keep potassium approximately 4, magnesium  approximately 2. - IV antiemetics as needed. - Continue Tylenol  for mild pain, ketorolac  for moderate pain, morphine  for severe pain. - Tolerating current diet. - Supportive care.  2.  Hypertension -BP controlled and currently stable. - Continue to hold home regimen Norvasc .  3.  Dehydration -IV fluids.  4.  Obesity -Lifestyle modification. - Outpatient follow-up with PCP.   DVT prophylaxis: Lovenox  Code Status: Full Family Communication: Updated patient.  No family at bedside. Disposition: Home when  clinically improved.  Status is: Observation The patient remains OBS appropriate and will d/c before 2 midnights.   Consultants:  None  Procedures: CT abdomen and pelvis 10/03/2024   Antimicrobials:  Anti-infectives (From admission, onward)    None         Subjective: Patient sitting up on the side of the bed.  Denies any chest pain or shortness of breath.  No further nausea or vomiting.  No diarrhea.  Tolerated breakfast this morning per patient.  Patient states passing flatus and had bowel movement this morning.  Objective: Vitals:   10/03/24 2025 10/04/24 0126 10/04/24 0544 10/04/24 0840  BP: 107/73 106/62 118/72 126/83  Pulse: 76 63 64 69  Resp: 15 15 15 16   Temp: 97.9 F (36.6 C) 98.2 F (36.8 C) 98.3 F (36.8 C) 97.8 F (36.6 C)  TempSrc: Oral Oral Oral   SpO2: 97% 95% 97% 99%    Intake/Output Summary (Last 24 hours) at 10/04/2024 1111 Last data filed at 10/04/2024 1108 Gross per 24 hour  Intake 1807.42 ml  Output 450 ml  Net 1357.42 ml   There were no vitals filed for this visit.  Examination:  General exam: Appears calm and comfortable  Respiratory system: Clear to auscultation.  No wheezes, no crackles, no rhonchi.  Fair air movement.  Speaking in full sentences.  Respiratory effort normal. Cardiovascular system: S1 & S2 heard, RRR. No JVD, murmurs, rubs, gallops or clicks. No pedal edema. Gastrointestinal system: Abdomen is nondistended, soft and nontender. No organomegaly or masses felt. Normal bowel sounds heard. Central nervous system: Alert and oriented. No focal neurological deficits. Extremities: Symmetric 5 x 5 power. Skin: No rashes, lesions or ulcers Psychiatry: Judgement  and insight appear normal. Mood & affect appropriate.     Data Reviewed: I have personally reviewed following labs and imaging studies  CBC: Recent Labs  Lab 10/03/24 1221 10/04/24 0538  WBC 5.3 3.6*  HGB 12.9* 11.9*  HCT 40.7 36.4*  MCV 90.0 89.4  PLT 227  180    Basic Metabolic Panel: Recent Labs  Lab 10/03/24 1221 10/04/24 0538  NA 138 138  K 3.5 4.0  CL 106 108  CO2 20* 20*  GLUCOSE 113* 81  BUN 15 13  CREATININE 0.88 0.68  CALCIUM 9.2 8.6*  MG 2.2  --     GFR: CrCl cannot be calculated (Unknown ideal weight.).  Liver Function Tests: Recent Labs  Lab 10/03/24 1221  AST 19  ALT 8  ALKPHOS 58  BILITOT 0.5  PROT 7.4  ALBUMIN 3.7    CBG: No results for input(s): GLUCAP in the last 168 hours.   No results found for this or any previous visit (from the past 240 hours).       Radiology Studies: CT ABDOMEN PELVIS W CONTRAST Result Date: 10/03/2024 CLINICAL DATA:  One day history of abdominal pain and vomiting. History of incisional hernia repair 04/30/2024. EXAM: CT ABDOMEN AND PELVIS WITH CONTRAST TECHNIQUE: Multidetector CT imaging of the abdomen and pelvis was performed using the standard protocol following bolus administration of intravenous contrast. RADIATION DOSE REDUCTION: This exam was performed according to the departmental dose-optimization program which includes automated exposure control, adjustment of the mA and/or kV according to patient size and/or use of iterative reconstruction technique. CONTRAST:  OMNIPAQUE  IOHEXOL  300 MG/ML  SOLN COMPARISON:  CT abdomen and pelvis dated 05/20/2024 FINDINGS: Lower chest: No focal consolidation or pulmonary nodule in the lung bases. No pleural effusion or pneumothorax demonstrated. Partially imaged heart size is normal. Dilated ascending thoracic aorta measures 4.0 cm. Hepatobiliary: No focal hepatic lesions. No intra or extrahepatic biliary ductal dilation. Normal gallbladder. Pancreas: No focal lesions or main ductal dilation. Spleen: Normal in size without focal abnormality. Adrenals/Urinary Tract: No adrenal nodules. No suspicious renal mass, calculi or hydronephrosis. Underdistended urinary bladder contains a small amount of excreted contrast material.  Stomach/Bowel: Normal appearance of the stomach. Segmental loops of dilated small bowel without areas of abrupt caliber narrowing. Mild mural thickening of lower midline small bowel loops. Appendix is not discretely seen. Vascular/Lymphatic: Aortic atherosclerosis. No enlarged abdominal or pelvic lymph nodes. Reproductive: Prostatectomy. Other: No free fluid, fluid collection, or free air. Small area of irregular soft tissue thickening deep to the umbilicus (12:60). Musculoskeletal: No acute or abnormal lytic or blastic osseous lesions. Status post right hip arthroplasty. Postsurgical changes of the anterior abdominal wall. Multilevel degenerative changes of the partially imaged thoracic and lumbar spine. Unchanged vertebral body height loss of T12, L2, and L4. IMPRESSION: 1. Segmental loops of dilated small bowel without areas of abrupt caliber narrowing, likely ileus. Mild mural thickening of lower midline small bowel loops, which may represent enteritis. 2. Small area of irregular soft tissue thickening deep to the umbilicus, likely postsurgical. 3. Dilated ascending thoracic aorta measures 4.0 cm. Recommend annual imaging followup by CTA or MRA. This recommendation follows 2010 ACCF/AHA/AATS/ACR/ASA/SCA/SCAI/SIR/STS/SVM Guidelines for the Diagnosis and Management of Patients with Thoracic Aortic Disease. Circulation. 2010; 121: Z733-z630. Aortic aneurysm NOS (ICD10-I71.9) 4.  Aortic Atherosclerosis (ICD10-I70.0). Electronically Signed   By: Limin  Xu M.D.   On: 10/03/2024 15:56        Scheduled Meds:  enoxaparin  (LOVENOX ) injection  40 mg Subcutaneous  Q24H   metoCLOPramide  (REGLAN ) injection  10 mg Intravenous Q6H   oxybutynin  5 mg Oral Daily   sodium chloride  flush  3 mL Intravenous Q12H   Continuous Infusions:  sodium chloride      lactated ringers        LOS: 0 days    Time spent: 40 minutes    Toribio Hummer, MD [facility]   To contact the attending provider between 7A-7P  or the covering provider during after hours 7P-7A, please log into the web site [email or web address] and access using universal [place] password for that web site. If you do not have the password, please call the hospital operator.  10/04/2024, 11:11 AM

## 2024-10-05 ENCOUNTER — Other Ambulatory Visit (HOSPITAL_COMMUNITY): Payer: Self-pay

## 2024-10-05 DIAGNOSIS — E860 Dehydration: Secondary | ICD-10-CM | POA: Diagnosis not present

## 2024-10-05 DIAGNOSIS — K567 Ileus, unspecified: Secondary | ICD-10-CM | POA: Diagnosis not present

## 2024-10-05 DIAGNOSIS — K432 Incisional hernia without obstruction or gangrene: Secondary | ICD-10-CM | POA: Diagnosis not present

## 2024-10-05 LAB — CBC WITH DIFFERENTIAL/PLATELET
Abs Immature Granulocytes: 0.01 K/uL (ref 0.00–0.07)
Basophils Absolute: 0 K/uL (ref 0.0–0.1)
Basophils Relative: 1 %
Eosinophils Absolute: 0.2 K/uL (ref 0.0–0.5)
Eosinophils Relative: 5 %
HCT: 42 % (ref 39.0–52.0)
Hemoglobin: 13.1 g/dL (ref 13.0–17.0)
Immature Granulocytes: 0 %
Lymphocytes Relative: 33 %
Lymphs Abs: 1.1 K/uL (ref 0.7–4.0)
MCH: 28.6 pg (ref 26.0–34.0)
MCHC: 31.2 g/dL (ref 30.0–36.0)
MCV: 91.7 fL (ref 80.0–100.0)
Monocytes Absolute: 0.2 K/uL (ref 0.1–1.0)
Monocytes Relative: 7 %
Neutro Abs: 1.9 K/uL (ref 1.7–7.7)
Neutrophils Relative %: 54 %
Platelets: 215 K/uL (ref 150–400)
RBC: 4.58 MIL/uL (ref 4.22–5.81)
RDW: 14.6 % (ref 11.5–15.5)
WBC: 3.5 K/uL — ABNORMAL LOW (ref 4.0–10.5)
nRBC: 0 % (ref 0.0–0.2)

## 2024-10-05 LAB — BASIC METABOLIC PANEL WITH GFR
Anion gap: 10 (ref 5–15)
BUN: 12 mg/dL (ref 8–23)
CO2: 27 mmol/L (ref 22–32)
Calcium: 9.6 mg/dL (ref 8.9–10.3)
Chloride: 103 mmol/L (ref 98–111)
Creatinine, Ser: 0.8 mg/dL (ref 0.61–1.24)
GFR, Estimated: >60 mL/min (ref >60)
Glucose, Bld: 91 mg/dL (ref 70–99)
Potassium: 4 mmol/L (ref 3.5–5.1)
Sodium: 140 mmol/L (ref 135–145)

## 2024-10-05 LAB — MAGNESIUM: Magnesium: 2.4 mg/dL (ref 1.7–2.4)

## 2024-10-05 MED ORDER — AMLODIPINE BESYLATE 10 MG PO TABS: 10.0000 mg | ORAL_TABLET | Freq: Every day | ORAL | Status: AC

## 2024-10-05 MED ORDER — METOCLOPRAMIDE HCL 5 MG PO TABS: 5.0000 mg | ORAL_TABLET | Freq: Three times a day (TID) | ORAL | Status: DC

## 2024-10-05 MED ORDER — METOCLOPRAMIDE HCL 5 MG PO TABS
5.0000 mg | ORAL_TABLET | Freq: Three times a day (TID) | ORAL | 0 refills | Status: DC
  Filled 2024-10-05: qty 9, 3d supply, fill #0

## 2024-10-05 NOTE — Progress Notes (Signed)
Pt refused to have IV placed.

## 2024-10-05 NOTE — Progress Notes (Signed)
 Discharge med ina secure bag delivered to patient in room by this RN

## 2024-10-05 NOTE — Progress Notes (Signed)
Pt was discharged home today. Instructions were reviewed with patient, and questions were answered. Pt was taken to main entrance via wheelchair by NT.  

## 2024-10-05 NOTE — Plan of Care (Signed)

## 2024-10-05 NOTE — Progress Notes (Signed)
   10/05/24 0855  TOC Brief Assessment  Insurance and Status Reviewed  Patient has primary care physician Yes  Home environment has been reviewed single family home  Prior level of function: independent  Prior/Current Home Services No current home services  Social Drivers of Health Review SDOH reviewed no interventions necessary  Readmission risk has been reviewed Yes  Transition of care needs no transition of care needs at this time    Signed: Heather Saltness, MSW, LCSW Clinical Social Worker Inpatient Care Management 10/05/2024 8:56 AM

## 2024-10-05 NOTE — Discharge Summary (Signed)
 Physician Discharge Summary  Jared Rhodes. FMW:[id number] DOB: [birth year] DOA: 10/03/2024  PCP: [name], NP  Admit date: 10/03/2024 Discharge date: 10/05/2024  Time spent: 60 minutes  Recommendations for Outpatient Follow-up:  Follow-up with [name], NP in 1 to 2 weeks.  On follow-up patient will need a basic metabolic profile and to follow-up on electrolytes and renal function.  Patient will need a CBC done to follow-up on counts.  Patient blood pressure need to be reassessed on follow-up.   Discharge Diagnoses:  Principal Problem:   Ileus (HCC) Active Problems:   HTN (hypertension)   Severe obesity (BMI >= 40) (HCC)   Incisional hernia   Dehydration   Discharge Condition: Stable and improved.  Diet recommendation: Heart healthy  There were no vitals filed for this visit.  History of present illness:  HPI per Dr. Jackee Zachary Poblano  Raddle. is a 73 y.o. male with medical history significant of hypertension, arthritis, incisional hernia repair in May with subsequent small bowel obstruction x 2 both medically managed, prostate cancer who presents with complaints of abdominal pain, nausea and vomiting.  Pain began yesterday and nausea and vomiting followed.  Patient states he has some abdominal distention's.  His symptoms are similar to his prior small bowel obstruction.  He states that he ate eggs grits and bacon this morning and did not have any nausea or vomiting.  Was able to hold them down.  He also had a bowel movement this morning which was watery.  In the emergency department he was given Tylenol  for pain.  He states that he does not want anything stronger.  We agreed on Tylenol  for mild pain, ketorolac  for moderate pain and morphine  for severe pain.     ED Course: In the emergency department laboratory workup was reassuring and CT scan showed omental loops of dilated small bowel without areas of abrupt caliber narrowing.  Likely ileus.  Mild  mural thickening over lower midline small bowel loops which may represent enteritis.  Small area of irregular soft tissue thickening deep to the umbilicus likely postsurgical.  Dilated ascending thoracic aorta measures 4.0 cm.  Annual follow-up by CTA or MRA recommended.   Hospital Course:  #1 ileus/history of incisional hernia repair -Patient with recurrent episodes of abdominal pain nausea vomiting since repair of incisional hernia in May 2025. - CT abdomen and pelvis obtained with segmental loops of dilated small bowel without areas of abrupt caliber narrowing, likely ileus.  Mild mural thickening of lower midline small bowel loops which may represent enteritis.  Small area of irregular soft tissue thickening deep to the umbilicus, likely postsurgical.  Dilated ascending thoracic aorta measuring 4.0 cm. - Patient admitted, initially placed on bowel rest, placed on IV fluids, scheduled IV Reglan  every 6 hours x 6 doses.   - Electrolytes repleted with goal to keep potassium approximately 4, magnesium  approximately 2.   - Patient improved clinically diet was advanced to a soft diet which she tolerated.   - Patient also maintained on Tylenol  for mild pain and ketorolac  for moderate pain and morphine  for severe pain.   - Patient improved clinically, was passing flatus, having bowel movements, abdominal pain resolved, tolerating oral diet.   - Patient be discharged home in stable and improved condition on 3 more days of Reglan  5 mg 3 times daily with meals.   - Outpatient follow-up with PCP.    2.  Hypertension -BP controlled and remained stable during the hospitalization.  -Patient's home  regimen Norvasc  was held and will be resumed on discharge.    3.  Dehydration - Patient hydrated with IV fluids and was euvolemic by day of discharge.     4.  Obesity -Lifestyle modification. - Outpatient follow-up with PCP.  Procedures: CT abdomen and pelvis 10/03/2024   Consultations: None  Discharge  Exam: Vitals:   10/05/24 0545 10/05/24 1014  BP: (!) 109/91 116/71  Pulse: 64 (!) (P) 58  Resp: 17   Temp: 97.7 F (36.5 C) (!) 97.3 F (36.3 C)  SpO2: 97% 99%    General: NAD Cardiovascular: Regular rate rhythm no murmurs rubs or gallops.  No JVD.  No lower extremity edema. Respiratory: Clear to auscultation bilaterally, no rhonchi.  Fair air movement.  Speaking in full sentences. GI: Abdomen is soft, nontender, nondistended, positive bowel sounds.  No rebound.  No guarding.  Discharge Instructions   Discharge Instructions     Diet - low sodium heart healthy   Complete by: As directed    Increase activity slowly   Complete by: As directed       Allergies as of 10/05/2024       Reactions   Penicillins Rash        Medication List     STOP taking these medications    solifenacin 5 MG tablet Commonly known as: VESICARE       TAKE these medications    acetaminophen  325 MG tablet Commonly known as: TYLENOL  Take 650 mg by mouth daily as needed for moderate pain (pain score 4-6) or mild pain (pain score 1-3).   amLODipine  10 MG tablet Commonly known as: NORVASC  Take 1 tablet (10 mg total) by mouth daily. Start taking on: October 06, 2024   metoCLOPramide  5 MG tablet Commonly known as: REGLAN  Take 1 tablet (5 mg total) by mouth 3 (three) times daily before meals for 3 days.   oxybutynin 5 MG tablet Commonly known as: DITROPAN Take 5 mg by mouth daily.       Allergies  Allergen Reactions   Penicillins Rash    Follow-up Information     [name], NP. Schedule an appointment as soon as possible for a visit in 2 week(s).   Specialty: Internal Medicine Why: Follow-up in 1 to 2 weeks. Contact information: [address] [place] [address] [phone]                  The results of significant diagnostics from this hospitalization (including imaging, microbiology, ancillary and laboratory) are listed below for  reference.    Significant Diagnostic Studies: CT ABDOMEN PELVIS W CONTRAST Result Date: 10/03/2024 CLINICAL DATA:  One day history of abdominal pain and vomiting. History of incisional hernia repair 04/30/2024. EXAM: CT ABDOMEN AND PELVIS WITH CONTRAST TECHNIQUE: Multidetector CT imaging of the abdomen and pelvis was performed using the standard protocol following bolus administration of intravenous contrast. RADIATION DOSE REDUCTION: This exam was performed according to the departmental dose-optimization program which includes automated exposure control, adjustment of the mA and/or kV according to patient size and/or use of iterative reconstruction technique. CONTRAST:  OMNIPAQUE  IOHEXOL  300 MG/ML  SOLN COMPARISON:  CT abdomen and pelvis dated 05/20/2024 FINDINGS: Lower chest: No focal consolidation or pulmonary nodule in the lung bases. No pleural effusion or pneumothorax demonstrated. Partially imaged heart size is normal. Dilated ascending thoracic aorta measures 4.0 cm. Hepatobiliary: No focal hepatic lesions. No intra or extrahepatic biliary ductal dilation. Normal gallbladder. Pancreas: No focal lesions or main  ductal dilation. Spleen: Normal in size without focal abnormality. Adrenals/Urinary Tract: No adrenal nodules. No suspicious renal mass, calculi or hydronephrosis. Underdistended urinary bladder contains a small amount of excreted contrast material. Stomach/Bowel: Normal appearance of the stomach. Segmental loops of dilated small bowel without areas of abrupt caliber narrowing. Mild mural thickening of lower midline small bowel loops. Appendix is not discretely seen. Vascular/Lymphatic: Aortic atherosclerosis. No enlarged abdominal or pelvic lymph nodes. Reproductive: Prostatectomy. Other: No free fluid, fluid collection, or free air. Small area of irregular soft tissue thickening deep to the umbilicus (12:60). Musculoskeletal: No acute or abnormal lytic or blastic osseous lesions. Status  post right hip arthroplasty. Postsurgical changes of the anterior abdominal wall. Multilevel degenerative changes of the partially imaged thoracic and lumbar spine. Unchanged vertebral body height loss of T12, L2, and L4. IMPRESSION: 1. Segmental loops of dilated small bowel without areas of abrupt caliber narrowing, likely ileus. Mild mural thickening of lower midline small bowel loops, which may represent enteritis. 2. Small area of irregular soft tissue thickening deep to the umbilicus, likely postsurgical. 3. Dilated ascending thoracic aorta measures 4.0 cm. Recommend annual imaging followup by CTA or MRA. This recommendation follows 2010 ACCF/AHA/AATS/ACR/ASA/SCA/SCAI/SIR/STS/SVM Guidelines for the Diagnosis and Management of Patients with Thoracic Aortic Disease. Circulation. 2010; 121: Z733-z630. Aortic aneurysm NOS (ICD10-I71.9) 4.  Aortic Atherosclerosis (ICD10-I70.0). Electronically Signed   By: Limin  Xu M.D.   On: 10/03/2024 15:56    Microbiology: No results found for this or any previous visit (from the past 240 hours).   Labs: Basic Metabolic Panel: Recent Labs  Lab 10/03/24 1221 10/04/24 0538 10/05/24 0844  NA 138 138 140  K 3.5 4.0 4.0  CL 106 108 103  CO2 20* 20* 27  GLUCOSE 113* 81 91  BUN 15 13 12   CREATININE 0.88 0.68 0.80  CALCIUM 9.2 8.6* 9.6  MG 2.2  --  2.4   Liver Function Tests: Recent Labs  Lab 10/03/24 1221  AST 19  ALT 8  ALKPHOS 58  BILITOT 0.5  PROT 7.4  ALBUMIN 3.7   Recent Labs  Lab 10/03/24 1221  LIPASE 33   No results for input(s): AMMONIA in the last 168 hours. CBC: Recent Labs  Lab 10/03/24 1221 10/04/24 0538 10/05/24 0844  WBC 5.3 3.6* 3.5*  NEUTROABS  --   --  1.9  HGB 12.9* 11.9* 13.1  HCT 40.7 36.4* 42.0  MCV 90.0 89.4 91.7  PLT 227 180 215   Cardiac Enzymes: No results for input(s): CKTOTAL, CKMB, CKMBINDEX, TROPONINI in the last 168 hours. BNP: BNP (last 3 results) No results for input(s): BNP in the last  8760 hours.  ProBNP (last 3 results) No results for input(s): PROBNP in the last 8760 hours.  CBG: No results for input(s): GLUCAP in the last 168 hours.     Signed:  Toribio Hummer MD.  [facility] 10/05/2024, 12:01 PM

## 2024-10-06 ENCOUNTER — Telehealth (INDEPENDENT_AMBULATORY_CARE_PROVIDER_SITE_OTHER): Payer: Self-pay

## 2024-10-06 NOTE — Transitions of Care (Post Inpatient/ED Visit) (Signed)
   10/06/2024  Name: Jared Rhodes. MRN: [id number] DOB: 04-Jun-1951  Today's TOC FU Call Status: Today's TOC FU Call Status:: Successful TOC FU Call Completed TOC FU Call Complete Date: 10/06/24 Patient's Name and Date of Birth confirmed.  Transition Care Management Follow-up Telephone Call Date of Discharge: 10/05/24 Discharge Facility: [facility]) Type of Discharge: Inpatient Admission Primary Inpatient Discharge Diagnosis:: Ileus How have you been since you were released from the hospital?: Better Any questions or concerns?: No  Items Reviewed: Did you receive and understand the discharge instructions provided?: Yes Medications obtained,verified, and reconciled?: Yes (Medications Reviewed) Any new allergies since your discharge?: No Dietary orders reviewed?: NA Do you have support at home?: Yes People in Home [RPT]: significant other  Medications Reviewed Today: Medications Reviewed Today     Reviewed by Lavelle Charmaine NOVAK, LPN (Licensed Practical Nurse) on 10/06/24 at 1408  Med List Status: <None>   Medication Order Taking? Sig Documenting Provider Last Dose Status Informant  acetaminophen  (TYLENOL ) 325 MG tablet [id number] Yes Take 650 mg by mouth daily as needed for moderate pain (pain score 4-6) or mild pain (pain score 1-3). [provider]  Active Self, Pharmacy Records, Spouse/Significant Other           Med Note STEFFI, [place]RIA   Thu Apr 23, 2024 10:26 AM)    amLODipine  (NORVASC ) 10 MG tablet [id number] Yes Take 1 tablet (10 mg total) by mouth daily. Sebastian Toribio GAILS, MD  Active   metoCLOPramide  (REGLAN ) 5 MG tablet [id number] Yes Take 1 tablet (5 mg total) by mouth 3 (three) times daily before meals for 3 days. Sebastian Toribio GAILS, MD  Active   oxybutynin (DITROPAN) 5 MG tablet [id number] Yes Take 5 mg by mouth daily. [provider]  Active Self, Pharmacy Records, Spouse/Significant Other            Home Care and  Equipment/Supplies: Were Home Health Services Ordered?: NA Any new equipment or medical supplies ordered?: NA  Functional Questionnaire: Do you need assistance with bathing/showering or dressing?: No Do you need assistance with meal preparation?: No Do you need assistance with eating?: No Do you have difficulty maintaining continence: No Do you need assistance with getting out of bed/getting out of a chair/moving?: No Do you have difficulty managing or taking your medications?: No  Follow up appointments reviewed: PCP Follow-up appointment confirmed?: Yes Date of PCP follow-up appointment?: 11/04/24 Follow-up Provider: Rosaline Bohr NP Specialist Hospital Follow-up appointment confirmed?: NA Do you need transportation to your follow-up appointment?: No Do you understand care options if your condition(s) worsen?: Yes-patient verbalized understanding    SIGNATURE Charmaine Lavelle, LPN [facility] Health Advisor [facility] You Are. We Are. [facility] Direct Dial [phone]

## 2024-10-12 ENCOUNTER — Emergency Department (HOSPITAL_COMMUNITY)

## 2024-10-12 ENCOUNTER — Encounter (HOSPITAL_COMMUNITY): Payer: Self-pay | Admitting: Emergency Medicine

## 2024-10-12 ENCOUNTER — Other Ambulatory Visit: Payer: Self-pay

## 2024-10-12 ENCOUNTER — Observation Stay (HOSPITAL_COMMUNITY)
Admission: EM | Admit: 2024-10-12 | Discharge: 2024-10-13 | Disposition: A | Discharge status: Home / Self Care | Attending: Internal Medicine | Admitting: Internal Medicine

## 2024-10-12 DIAGNOSIS — I10 Essential (primary) hypertension: Secondary | ICD-10-CM | POA: Diagnosis not present

## 2024-10-12 DIAGNOSIS — Z96641 Presence of right artificial hip joint: Secondary | ICD-10-CM | POA: Insufficient documentation

## 2024-10-12 DIAGNOSIS — Z79899 Other long term (current) drug therapy: Secondary | ICD-10-CM | POA: Diagnosis not present

## 2024-10-12 DIAGNOSIS — Z8546 Personal history of malignant neoplasm of prostate: Secondary | ICD-10-CM | POA: Diagnosis not present

## 2024-10-12 DIAGNOSIS — K56609 Unspecified intestinal obstruction, unspecified as to partial versus complete obstruction: Principal | ICD-10-CM | POA: Insufficient documentation

## 2024-10-12 DIAGNOSIS — F17210 Nicotine dependence, cigarettes, uncomplicated: Secondary | ICD-10-CM | POA: Insufficient documentation

## 2024-10-12 DIAGNOSIS — F17200 Nicotine dependence, unspecified, uncomplicated: Secondary | ICD-10-CM

## 2024-10-12 DIAGNOSIS — R10819 Abdominal tenderness, unspecified site: Secondary | ICD-10-CM | POA: Diagnosis present

## 2024-10-12 LAB — I-STAT CHEM 8, ED
BUN: 17 mg/dL (ref 8–23)
Calcium, Ion: 1.18 mmol/L (ref 1.15–1.40)
Chloride: 104 mmol/L (ref 98–111)
Creatinine, Ser: 0.9 mg/dL (ref 0.61–1.24)
Glucose, Bld: 101 mg/dL — ABNORMAL HIGH (ref 70–99)
HCT: 43 % (ref 39.0–52.0)
Hemoglobin: 14.6 g/dL (ref 13.0–17.0)
Potassium: 4.1 mmol/L (ref 3.5–5.1)
Sodium: 141 mmol/L (ref 135–145)
TCO2: 25 mmol/L (ref 22–32)

## 2024-10-12 LAB — COMPREHENSIVE METABOLIC PANEL WITH GFR
ALT: 13 U/L (ref 0–44)
AST: 21 U/L (ref 15–41)
Albumin: 4.2 g/dL (ref 3.5–5.0)
Alkaline Phosphatase: 59 U/L (ref 38–126)
Anion gap: 11 (ref 5–15)
BUN: 16 mg/dL (ref 8–23)
CO2: 24 mmol/L (ref 22–32)
Calcium: 9.7 mg/dL (ref 8.9–10.3)
Chloride: 104 mmol/L (ref 98–111)
Creatinine, Ser: 0.79 mg/dL (ref 0.61–1.24)
GFR, Estimated: >60 mL/min (ref >60)
Glucose, Bld: 96 mg/dL (ref 70–99)
Potassium: 4 mmol/L (ref 3.5–5.1)
Sodium: 138 mmol/L (ref 135–145)
Total Bilirubin: 0.5 mg/dL (ref 0.0–1.2)
Total Protein: 8.1 g/dL (ref 6.5–8.1)

## 2024-10-12 LAB — URINALYSIS, ROUTINE W REFLEX MICROSCOPIC
Bilirubin Urine: NEGATIVE
Glucose, UA: NEGATIVE mg/dL
Hgb urine dipstick: NEGATIVE
Ketones, ur: 5 mg/dL — AB
Leukocytes,Ua: NEGATIVE
Nitrite: NEGATIVE
Protein, ur: NEGATIVE mg/dL
Specific Gravity, Urine: 1.029 (ref 1.005–1.030)
pH: 5 (ref 5.0–8.0)

## 2024-10-12 LAB — CBC
HCT: 43.9 % (ref 39.0–52.0)
Hemoglobin: 13.7 g/dL (ref 13.0–17.0)
MCH: 28 pg (ref 26.0–34.0)
MCHC: 31.2 g/dL (ref 30.0–36.0)
MCV: 89.8 fL (ref 80.0–100.0)
Platelets: 250 K/uL (ref 150–400)
RBC: 4.89 MIL/uL (ref 4.22–5.81)
RDW: 14.6 % (ref 11.5–15.5)
WBC: 5.3 K/uL (ref 4.0–10.5)
nRBC: 0 % (ref 0.0–0.2)

## 2024-10-12 LAB — LIPASE, BLOOD: Lipase: 32 U/L (ref 11–51)

## 2024-10-12 MED ORDER — MIDAZOLAM HCL (PF) 2 MG/2ML IJ SOLN
2.0000 mg | Freq: Once | INTRAMUSCULAR | Status: AC
  Administered 2024-10-12: 2 mg via INTRAVENOUS
  Filled 2024-10-12: qty 2

## 2024-10-12 MED ORDER — HYDROMORPHONE HCL 1 MG/ML IJ SOLN: 0.5000 mg | INTRAMUSCULAR | Status: DC | PRN

## 2024-10-12 MED ORDER — FENTANYL CITRATE (PF) 50 MCG/ML IJ SOSY
50.0000 ug | PREFILLED_SYRINGE | Freq: Once | INTRAMUSCULAR | Status: AC
  Administered 2024-10-12: 50 ug via INTRAVENOUS
  Filled 2024-10-12: qty 1

## 2024-10-12 MED ORDER — ONDANSETRON HCL 4 MG/2ML IJ SOLN: 4.0000 mg | Freq: Four times a day (QID) | INTRAMUSCULAR | Status: DC | PRN

## 2024-10-12 MED ORDER — DIATRIZOATE MEGLUMINE & SODIUM 66-10 % PO SOLN
90.0000 mL | Freq: Once | ORAL | Status: AC
  Administered 2024-10-13: 90 mL via NASOGASTRIC
  Filled 2024-10-12: qty 90

## 2024-10-12 MED ORDER — ACETAMINOPHEN 325 MG PO TABS: 650.0000 mg | ORAL_TABLET | Freq: Four times a day (QID) | ORAL | Status: DC | PRN

## 2024-10-12 MED ORDER — IOHEXOL 300 MG/ML  SOLN
100.0000 mL | Freq: Once | INTRAMUSCULAR | Status: AC | PRN
  Administered 2024-10-12: 100 mL via INTRAVENOUS

## 2024-10-12 MED ORDER — LIDOCAINE HCL URETHRAL/MUCOSAL 2 % EX GEL
1.0000 | Freq: Once | CUTANEOUS | Status: AC
  Administered 2024-10-12: 1 via TOPICAL
  Filled 2024-10-12: qty 11

## 2024-10-12 MED ORDER — ACETAMINOPHEN 650 MG RE SUPP: 650.0000 mg | Freq: Four times a day (QID) | RECTAL | Status: DC | PRN

## 2024-10-12 MED ORDER — NALOXONE HCL 0.4 MG/ML IJ SOLN: 0.4000 mg | INTRAMUSCULAR | Status: DC | PRN

## 2024-10-12 MED ORDER — OXYCODONE-ACETAMINOPHEN 5-325 MG PO TABS
1.0000 | ORAL_TABLET | Freq: Once | ORAL | Status: AC
  Administered 2024-10-12: 1 via ORAL
  Filled 2024-10-12: qty 1

## 2024-10-12 MED ORDER — ONDANSETRON 4 MG PO TBDP
4.0000 mg | ORAL_TABLET | Freq: Once | ORAL | Status: AC
  Administered 2024-10-12: 4 mg via ORAL
  Filled 2024-10-12: qty 1

## 2024-10-12 MED ORDER — ONDANSETRON HCL 4 MG/2ML IJ SOLN
4.0000 mg | Freq: Once | INTRAMUSCULAR | Status: AC
  Administered 2024-10-12: 4 mg via INTRAVENOUS
  Filled 2024-10-12: qty 2

## 2024-10-12 MED ORDER — SODIUM CHLORIDE 0.9 % IV SOLN: Freq: Once | INTRAVENOUS | Status: AC

## 2024-10-12 NOTE — ED Notes (Signed)
PT refuse to change into gown

## 2024-10-12 NOTE — ED Triage Notes (Signed)
 Pt. Reports hernia operation several months prior. Today c/o abdominal pain at and around incision site including N/V, similar to episodes experienced prior. Denies diarrhea and fever.

## 2024-10-12 NOTE — ED Provider Triage Note (Signed)
 Emergency Medicine Provider Triage Evaluation Note  Jakyri Brunkhorst. , a 73 y.o. male  was evaluated in triage.  Pt complains of abd pain for a while.  Seems he had a hernia repair four months ago and has had pain since then however he struggles to answer questions like how often has this happened  Some nausea and 3 episodes of emesis today.   Review of Systems  Positive: Abd pain Negative: Fever   Physical Exam  BP (!) 129/96 (BP Location: Left Arm)   Pulse 92   Temp 99 F (37.2 C) (Oral)   Resp 16   SpO2 99%  Gen:   Awake, no distress   Resp:  Normal effort  MSK:   Moves extremities without difficulty  Other:  Tender abd, small bruise to LLQ superficial.   Medical Decision Making  Medically screening exam initiated at 3:45 PM.  Appropriate orders placed.  Zachary Sandner  Jr. was informed that the remainder of the evaluation will be completed by another provider, this initial triage assessment does not replace that evaluation, and the importance of remaining in the ED until their evaluation is complete.  Labs, CT, urine    Neldon Inoue [place], [place] 10/12/24 1549

## 2024-10-12 NOTE — ED Provider Notes (Addendum)
 [place] EMERGENCY DEPARTMENT AT [facility] Provider Note   CSN: [id number] Arrival date & time: 10/12/24  1515     Patient presents with: Abdominal Pain and Emesis   Jared Rhodes  Jared Rhodes. is a 73 y.o. male.   Patient here with abdominal pain and nausea.  History of the same.  History of hernia surgery bowel obstructions ileus.  Denies any weakness numbness tingling.  He is actually feeling little bit better now.  He denies any constipation.  Has been having normal bowel movements.  He feels distended.  Denies any chest pain shortness of breath.  The history is provided by the patient.       Prior to Admission medications   Medication Sig Start Date End Date Taking? Authorizing Provider  acetaminophen  (TYLENOL ) 325 MG tablet Take 650 mg by mouth daily as needed for moderate pain (pain score 4-6) or mild pain (pain score 1-3).    [provider]  amLODipine  (NORVASC ) 10 MG tablet Take 1 tablet (10 mg total) by mouth daily. 10/06/24   Sebastian Toribio GAILS, MD  metoCLOPramide  (REGLAN ) 5 MG tablet Take 1 tablet (5 mg total) by mouth 3 (three) times daily before meals for 3 days. 10/05/24 10/08/24  Sebastian Toribio GAILS, MD  oxybutynin (DITROPAN) 5 MG tablet Take 5 mg by mouth daily. 09/29/24   [provider]    Allergies: Penicillins    Review of Systems  Updated Vital Signs BP (!) 158/103 (BP Location: Left Arm)   Pulse 79   Temp 98.4 F (36.9 C) (Oral)   Resp 16   SpO2 100%   Physical Exam Vitals and nursing note reviewed.  Constitutional:      General: He is not in acute distress.    Appearance: He is well-developed.  HENT:     Head: Normocephalic and atraumatic.  Eyes:     Conjunctiva/sclera: Conjunctivae normal.  Cardiovascular:     Rate and Rhythm: Normal rate and regular rhythm.     Heart sounds: No murmur heard. Pulmonary:     Effort: Pulmonary effort is normal. No respiratory distress.     Breath sounds: Normal breath sounds.   Abdominal:     Palpations: Abdomen is soft.     Tenderness: There is abdominal tenderness.  Musculoskeletal:        General: No swelling.     Cervical back: Neck supple.  Skin:    General: Skin is warm and dry.     Capillary Refill: Capillary refill takes less than 2 seconds.  Neurological:     Mental Status: He is alert.  Psychiatric:        Mood and Affect: Mood normal.     (all labs ordered are listed, but only abnormal results are displayed) Labs Reviewed  URINALYSIS, ROUTINE W REFLEX MICROSCOPIC - Abnormal; Notable for the following components:      Result Value   Ketones, ur 5 (*)    All other components within normal limits  I-STAT CHEM 8, ED - Abnormal; Notable for the following components:   Glucose, Bld 101 (*)    All other components within normal limits  CBC  COMPREHENSIVE METABOLIC PANEL WITH GFR  LIPASE, BLOOD  CBC WITH DIFFERENTIAL/PLATELET  COMPREHENSIVE METABOLIC PANEL WITH GFR  MAGNESIUM     EKG: None  Radiology: CT ABDOMEN PELVIS W CONTRAST Result Date: 10/12/2024 EXAM: CT ABDOMEN AND PELVIS WITH CONTRAST 10/12/2024 09:24:30 PM TECHNIQUE: CT of the abdomen and pelvis was performed with the administration of 100  mL of iohexol  (OMNIPAQUE ) 300 MG/ML solution. Multiplanar reformatted images are provided for review. Automated exposure control, iterative reconstruction, and/or weight-based adjustment of the mA/kV was utilized to reduce the radiation dose to as low as reasonably achievable. COMPARISON: None available. CLINICAL HISTORY: LLQ abdominal pain; diffuse lower abd pain. Per chart: Pt. Reports hernia operation several months prior. Today c/o abdominal pain at and around incision site including N/V, similar to episodes experienced prior. Denies diarrhea and fever. FINDINGS: LOWER CHEST: Trace paraseptal emphysematous changes. Partially visualized stable aneurysmal ascending thoracic aorta (4 cm). LIVER: The liver is unremarkable. GALLBLADDER AND BILE DUCTS:  Gallbladder is unremarkable. No biliary ductal dilatation. SPLEEN: No acute abnormality. PANCREAS: No acute abnormality. ADRENAL GLANDS: No acute abnormality. KIDNEYS, URETERS AND BLADDER: No stones in the kidneys or ureters. No hydronephrosis. No perinephric or periureteral stranding. Markedly limited evaluation of the urinary bladder due to decompression and streak artifact. GI AND BOWEL: Stomach demonstrates no acute abnormality. Multiple loops of small bowel are dilated with fluid overlying associated air-fluid levels. Small bowel caliber measures up to 4.5 cm with a transition point within the left anterior mid abdomen (2:51). No pneumatosis. No bowel wall thickening. The large bowel is decompressed. No small bowel thickening of the large bowel. The appendix is unremarkable. PERITONEUM AND RETROPERITONEUM: No ascites. No free air. VASCULATURE: Aorta is normal in caliber. Moderate atherosclerotic plaque. LYMPH NODES: No lymphadenopathy. REPRODUCTIVE ORGANS: Limited evaluation of the prostate due to streak artifacts. BONES AND SOFT TISSUES: Total right hip arthroplasty partially visualized. Multilevel severe degenerative change of the spine with multilevel stable vertebral body height loss. No acute osseous abnormality. No focal soft tissue abnormality. IMPRESSION: 1. High grade small bowel obstruction with a transition point in the left anterior mid abdomen. 2. Stable aneurysmal ascending thoracic aorta (4 cm).  Recommend annual imaging followup by CTA or MRA.  This recommendation follows 2010 ACCF/AHA/AATS/ACR/ASA/SCA/SCAI/SIR/STS/SVM Guidelines for the Diagnosis and Management of Patients with Thoracic Aortic Disease. Circulation. 2010; 121: Z733-z630.  Aortic aneurysm NOS (ICD10-I71.9) Electronically signed by: Kate Plummer MD 10/12/2024 09:38 PM EDT RP Workstation: [id number]     Procedures   Medications Ordered in the ED  0.9 %  sodium chloride  infusion (has no administration in time range)   acetaminophen  (TYLENOL ) tablet 650 mg (has no administration in time range)    Or  acetaminophen  (TYLENOL ) suppository 650 mg (has no administration in time range)  ondansetron  (ZOFRAN ) injection 4 mg (has no administration in time range)  naloxone (NARCAN) injection 0.4 mg (has no administration in time range)  HYDROmorphone  (DILAUDID ) injection 0.5 mg (has no administration in time range)  ondansetron  (ZOFRAN -ODT) disintegrating tablet 4 mg (4 mg Oral Given 10/12/24 1647)  oxyCODONE -acetaminophen  (PERCOCET/ROXICET) 5-325 MG per tablet 1 tablet (1 tablet Oral Given 10/12/24 1646)  iohexol  (OMNIPAQUE ) 300 MG/ML solution 100 mL (100 mLs Intravenous Contrast Given 10/12/24 2117)  fentaNYL  (SUBLIMAZE ) injection 50 mcg (50 mcg Intravenous Given 10/12/24 2134)  ondansetron  (ZOFRAN ) injection 4 mg (4 mg Intravenous Given 10/12/24 2133)                                    Medical Decision Making Amount and/or Complexity of Data Reviewed Labs: ordered.  Risk Prescription drug management. Decision regarding hospitalization.   Jared Rhodes  Jr. is here with abdominal pain nausea and vomiting.  He has history of bowel obstruction and ileus.  He had recent admission for the same.  He had hernia surgery in the past.  Differential diagnosis could be viral process or bowel obstruction or ileus or constipation.  Will get a CBC CMP lipase urinalysis CT scan abdomen pelvis.  Will give IV fluids IV nausea medicine IV pain medicine and reevaluate.  Overall lab work is unremarkable.  No significant leukocytosis anemia or electrolyte abnormality.  Patient with CT scan that does show high-grade small bowel obstruction with transition point in the left anterior mid abdomen.  Overall patient still has some discomfort to palpation on exam but is not having any nausea or vomiting.  Will hold off on NG tube.  Will talk with general surgery team and admitted to medicine for further care.  Talked with Dr.  Debby with general surgery team.  She recommended NG tube and small bowel protocol.  Patient after lengthy discussion does not want to have NG tube.  He has not been able to tolerate them well in the past.  I talked with him about giving him a dose of Versed  and sedating him to get the NG tube in but he still declines.  I let the hospitalist team made aware of this.  At this time he is comfortable.  He is not having any active nausea or vomiting.  But will not allow him to eat or drink at this time.  This chart was dictated using voice recognition software.  Despite best efforts to proofread,  errors can occur which can change the documentation meaning.      Final diagnoses:  Small bowel obstruction [facility])    ED Discharge Orders     None          Ruthe Cornet, DO 10/12/24 2152    Ruthe Cornet, DO 10/12/24 2309

## 2024-10-13 ENCOUNTER — Inpatient Hospital Stay (HOSPITAL_COMMUNITY)

## 2024-10-13 ENCOUNTER — Telehealth: Payer: Self-pay | Admitting: Acute Care

## 2024-10-13 DIAGNOSIS — F17210 Nicotine dependence, cigarettes, uncomplicated: Secondary | ICD-10-CM

## 2024-10-13 DIAGNOSIS — K56609 Unspecified intestinal obstruction, unspecified as to partial versus complete obstruction: Secondary | ICD-10-CM | POA: Diagnosis not present

## 2024-10-13 DIAGNOSIS — Z87891 Personal history of nicotine dependence: Secondary | ICD-10-CM

## 2024-10-13 DIAGNOSIS — Z122 Encounter for screening for malignant neoplasm of respiratory organs: Secondary | ICD-10-CM

## 2024-10-13 LAB — COMPREHENSIVE METABOLIC PANEL WITH GFR
ALT: 10 U/L (ref 0–44)
AST: 18 U/L (ref 15–41)
Albumin: 3.8 g/dL (ref 3.5–5.0)
Alkaline Phosphatase: 51 U/L (ref 38–126)
Anion gap: 11 (ref 5–15)
BUN: 14 mg/dL (ref 8–23)
CO2: 24 mmol/L (ref 22–32)
Calcium: 8.9 mg/dL (ref 8.9–10.3)
Chloride: 108 mmol/L (ref 98–111)
Creatinine, Ser: 0.7 mg/dL (ref 0.61–1.24)
GFR, Estimated: >60 mL/min (ref >60)
Glucose, Bld: 87 mg/dL (ref 70–99)
Potassium: 3.7 mmol/L (ref 3.5–5.1)
Sodium: 143 mmol/L (ref 135–145)
Total Bilirubin: 0.5 mg/dL (ref 0.0–1.2)
Total Protein: 7 g/dL (ref 6.5–8.1)

## 2024-10-13 LAB — CBC WITH DIFFERENTIAL/PLATELET
Abs Immature Granulocytes: 0.01 K/uL (ref 0.00–0.07)
Basophils Absolute: 0 K/uL (ref 0.0–0.1)
Basophils Relative: 0 %
Eosinophils Absolute: 0.1 K/uL (ref 0.0–0.5)
Eosinophils Relative: 3 %
HCT: 38.6 % — ABNORMAL LOW (ref 39.0–52.0)
Hemoglobin: 11.9 g/dL — ABNORMAL LOW (ref 13.0–17.0)
Immature Granulocytes: 0 %
Lymphocytes Relative: 27 %
Lymphs Abs: 1.1 K/uL (ref 0.7–4.0)
MCH: 27.9 pg (ref 26.0–34.0)
MCHC: 30.8 g/dL (ref 30.0–36.0)
MCV: 90.6 fL (ref 80.0–100.0)
Monocytes Absolute: 0.4 K/uL (ref 0.1–1.0)
Monocytes Relative: 9 %
Neutro Abs: 2.5 K/uL (ref 1.7–7.7)
Neutrophils Relative %: 61 %
Platelets: 218 K/uL (ref 150–400)
RBC: 4.26 MIL/uL (ref 4.22–5.81)
RDW: 14.8 % (ref 11.5–15.5)
WBC: 4.1 K/uL (ref 4.0–10.5)
nRBC: 0 % (ref 0.0–0.2)

## 2024-10-13 LAB — MAGNESIUM: Magnesium: 2.3 mg/dL (ref 1.7–2.4)

## 2024-10-13 MED ORDER — LACTATED RINGERS IV SOLN: INTRAVENOUS | Status: DC

## 2024-10-13 MED ORDER — DOCUSATE SODIUM 100 MG PO CAPS: 100.0000 mg | ORAL_CAPSULE | Freq: Two times a day (BID) | ORAL | Status: DC

## 2024-10-13 MED ORDER — DOCUSATE SODIUM 100 MG PO CAPS: 100.0000 mg | ORAL_CAPSULE | Freq: Every day | ORAL | 0 refills | Status: AC

## 2024-10-13 MED ORDER — POTASSIUM CHLORIDE 10 MEQ/100ML IV SOLN
10.0000 meq | INTRAVENOUS | Status: AC
  Administered 2024-10-13 (×2): 10 meq via INTRAVENOUS
  Filled 2024-10-13 (×3): qty 100

## 2024-10-13 MED ORDER — HYDRALAZINE HCL 20 MG/ML IJ SOLN: 5.0000 mg | Freq: Four times a day (QID) | INTRAMUSCULAR | Status: DC | PRN

## 2024-10-13 MED ORDER — MELATONIN 5 MG PO TABS
5.0000 mg | ORAL_TABLET | Freq: Every evening | ORAL | Status: DC | PRN
Start: 2024-10-13 — End: 2024-10-13

## 2024-10-13 MED ORDER — ONDANSETRON HCL 4 MG PO TABS: 4.0000 mg | ORAL_TABLET | Freq: Three times a day (TID) | ORAL | 0 refills | Status: AC | PRN

## 2024-10-13 MED ORDER — DIATRIZOATE MEGLUMINE & SODIUM 66-10 % PO SOLN: 90.0000 mL | Freq: Once | ORAL | Status: DC

## 2024-10-13 MED ORDER — ENOXAPARIN SODIUM 40 MG/0.4ML IJ SOSY: 40.0000 mg | PREFILLED_SYRINGE | Freq: Every day | INTRAMUSCULAR | Status: DC

## 2024-10-13 NOTE — H&P (Signed)
 History and Physical    Jared Rhodes. FMW:[id number] DOB: [birth year] DOA: 10/12/2024  DOS: the patient was seen and examined on 10/12/2024  PCP: [name], NP   Patient coming from: Home  I have personally briefly reviewed patient's old medical records in [facility] and CareEverywhere  HPI:  Jared Tukes  Rhodes. is a 73 y.o. year old male with past medical history of hypertension, prostate cancer, arthritis, incisional hernia repair with mesh in May 2025 with subsequent small bowel obstruction x 2 both medically managed and recently admitted earlier this month with an Ileus. He presents to [facility] ED with reports of abdominal pain, nausea, and vomiting.  Last bowel movement was yesterday and patient reports still having flatus this morning.  At time of my interview this morning patient did not have any abdominal pain or nausea.  ED Course: On arrival to [facility] ED patient was noted to be afebrile temp 37.2 C, BP 129/96, HR 92, RR 16, SpO2 99% on room air.  CT abdomen pelvis obtained and shows high-grade small bowel obstruction with a transition point in the left anterior mid abdomen and stable ascending thoracic aorta aneurysm measuring approximately 4 cm.  Labs notable for mild normocytic anemia hemoglobin 11.9.  He was given oxycodone , Zofran , fentanyl , started on continuous IVF.  General surgery was consulted who recommended NG tube placement in ED and agreed to see patient in consultation. [facility] contacted for admission.  Review of Systems: As mentioned in the history of present illness. All other systems reviewed and are negative.   Review of Systems  Constitutional:  Negative for chills and fever.  Respiratory:  Negative for cough, sputum production and shortness of breath.   Cardiovascular:  Negative for chest pain and leg swelling.  Gastrointestinal:  Positive for abdominal pain, nausea and vomiting. Negative for constipation and diarrhea.   Genitourinary:  Negative for dysuria and flank pain.  Neurological:  Negative for dizziness, focal weakness, weakness and headaches.    Past Medical History:  Diagnosis Date   Hypertension    Prostate cancer (HCC) 2018   radation 40 treatments     Past Surgical History:  Procedure Laterality Date   COLONOSCOPY     I & D EXTREMITY Left 07/17/2014   Procedure: IRRIGATION AND DEBRIDEMENT EXTREMITY;  Surgeon: Kay Ozell Cummins, MD;  Location: [facility] OR;  Service: Orthopedics;  Laterality: Left;   I & D EXTREMITY Left 07/19/2014   Procedure: IRRIGATION AND DEBRIDEMENT EXTREMITY;  Surgeon: Kay Ozell Cummins, MD;  Location: [facility] OR;  Service: Orthopedics;  Laterality: Left;   INCISIONAL HERNIA REPAIR N/A 04/30/2024   Procedure: REPAIR, HERNIA, INCISIONAL;  Surgeon: Vernetta Berg, MD;  Location: [facility] OR;  Service: General;  Laterality: N/A;   INSERTION OF MESH N/A 04/30/2024   Procedure: INSERTION OF MESH;  Surgeon: Vernetta Berg, MD;  Location: [facility] OR;  Service: General;  Laterality: N/A;   LYMPHADENECTOMY Bilateral 09/25/2021   Procedure: REDGIE, PELVIC;  Surgeon: Renda Glance, MD;  Location: [facility] ORS;  Service: Urology;  Laterality: Bilateral;   ORIF ANKLE FRACTURE Left 07/19/2014   Procedure: OPEN REDUCTION INTERNAL FIXATION (ORIF) ANKLE FRACTURE;  Surgeon: Kay Ozell Cummins, MD;  Location: [facility] OR;  Service: Orthopedics;  Laterality: Left;   PROSTATE BIOPSY     ROBOT ASSISTED LAPAROSCOPIC RADICAL PROSTATECTOMY N/A 09/25/2021   Procedure: XI ROBOTIC ASSISTED LAPAROSCOPIC RADICAL PROSTATECTOMY LEVEL 3;  Surgeon: Renda Glance, MD;  Location: [facility] ORS;  Service: Urology;  Laterality: N/A;  TONSILLECTOMY     TOTAL HIP ARTHROPLASTY Right 03/02/2016   Procedure: RIGHT TOTAL HIP ARTHROPLASTY ANTERIOR APPROACH;  Surgeon: Kay CHRISTELLA Cummins, MD;  Location: [facility] OR;  Service: Orthopedics;  Laterality: Right;     reports that he has been smoking cigarettes. He has a 30 pack-year smoking history.  He has never used smokeless tobacco. He reports current alcohol use of about 6.0 standard drinks of alcohol per week. He reports that he does not use drugs.  Allergies  Allergen Reactions   Penicillins Rash    Family History  Problem Relation Age of Onset   Alcohol abuse Mother    Heart disease Sister    Hyperlipidemia Brother    Colon cancer Neg Hx    Breast cancer Neg Hx    Prostate cancer Neg Hx    Pancreatic cancer Neg Hx    Colon polyps Neg Hx    Esophageal cancer Neg Hx    Rectal cancer Neg Hx    Stomach cancer Neg Hx     Prior to Admission medications   Medication Sig Start Date End Date Taking? Authorizing Provider  acetaminophen  (TYLENOL ) 325 MG tablet Take 650 mg by mouth daily as needed for moderate pain (pain score 4-6) or mild pain (pain score 1-3).    [provider]  amLODipine  (NORVASC ) 10 MG tablet Take 1 tablet (10 mg total) by mouth daily. 10/06/24   Sebastian Toribio GAILS, MD  metoCLOPramide  (REGLAN ) 5 MG tablet Take 1 tablet (5 mg total) by mouth 3 (three) times daily before meals for 3 days. 10/05/24 10/08/24  Sebastian Toribio GAILS, MD  oxybutynin (DITROPAN) 5 MG tablet Take 5 mg by mouth daily. 09/29/24   [provider]    Physical Exam: Vitals:   10/13/24 0130 10/13/24 0221 10/13/24 0230 10/13/24 0600  BP: 135/83  117/76 132/86  Pulse: 64 73 65 62  Resp: 16  16 16   Temp:    98 F (36.7 C)  TempSrc:    Oral  SpO2: 93% 92% 99% 100%    Physical Exam Vitals and nursing note reviewed.  HENT:     Head: Normocephalic.  Cardiovascular:     Rate and Rhythm: Normal rate and regular rhythm.     Heart sounds: No murmur heard.    No friction rub. No gallop.  Pulmonary:     Effort: Pulmonary effort is normal.     Breath sounds: Normal breath sounds. No wheezing, rhonchi or rales.  Abdominal:     General: There is distension.     Palpations: Abdomen is soft.     Tenderness: There is abdominal tenderness in the periumbilical area. There is no  guarding.     Hernia: A hernia is present. Hernia is present in the umbilical area.  Skin:    General: Skin is warm and dry.     Capillary Refill: Capillary refill takes less than 2 seconds.  Neurological:     General: No focal deficit present.     Mental Status: He is alert.  Psychiatric:        Attention and Perception: Attention and perception normal.        Mood and Affect: Mood normal. Affect is blunt.        Cognition and Memory: Cognition normal.        Judgment: Judgment normal.      Labs on Admission: I have personally reviewed following labs and imaging studies  CBC: Recent Labs  Lab 10/12/24 1636 10/12/24 2057  10/13/24 0545  WBC 5.3  --  4.1  NEUTROABS  --   --  2.5  HGB 13.7 14.6 11.9*  HCT 43.9 43.0 38.6*  MCV 89.8  --  90.6  PLT 250  --  218   Basic Metabolic Panel: Recent Labs  Lab 10/12/24 2050 10/12/24 2057 10/13/24 0545  NA 138 141 143  K 4.0 4.1 3.7  CL 104 104 108  CO2 24  --  24  GLUCOSE 96 101* 87  BUN 16 17 14   CREATININE 0.79 0.90 0.70  CALCIUM 9.7  --  8.9  MG  --   --  2.3   GFR: CrCl cannot be calculated (Unknown ideal weight.). Liver Function Tests: Recent Labs  Lab 10/12/24 2050 10/13/24 0545  AST 21 18  ALT 13 10  ALKPHOS 59 51  BILITOT 0.5 0.5  PROT 8.1 7.0  ALBUMIN 4.2 3.8   Recent Labs  Lab 10/12/24 2050  LIPASE 32   No results for input(s): AMMONIA in the last 168 hours. Coagulation Profile: No results for input(s): INR, PROTIME in the last 168 hours. Cardiac Enzymes: No results for input(s): CKTOTAL, CKMB, CKMBINDEX, TROPONINI, TROPONINIHS in the last 168 hours. BNP (last 3 results) No results for input(s): BNP in the last 8760 hours. HbA1C: No results for input(s): HGBA1C in the last 72 hours. CBG: No results for input(s): GLUCAP in the last 168 hours. Lipid Profile: No results for input(s): CHOL, HDL, LDLCALC, TRIG, CHOLHDL, LDLDIRECT in the last 72 hours. Thyroid  Function Tests: No results for input(s): TSH, T4TOTAL, FREET4, T3FREE, THYROIDAB in the last 72 hours. Anemia Panel: No results for input(s): VITAMINB12, FOLATE, FERRITIN, TIBC, IRON, RETICCTPCT in the last 72 hours. Urine analysis:    Component Value Date/Time   COLORURINE YELLOW 10/12/2024 1636   APPEARANCEUR CLEAR 10/12/2024 1636   LABSPEC 1.029 10/12/2024 1636   PHURINE 5.0 10/12/2024 1636   GLUCOSEU NEGATIVE 10/12/2024 1636   HGBUR NEGATIVE 10/12/2024 1636   BILIRUBINUR NEGATIVE 10/12/2024 1636   KETONESUR 5 (A) 10/12/2024 1636   PROTEINUR NEGATIVE 10/12/2024 1636   UROBILINOGEN 0.2 07/17/2014 0328   NITRITE NEGATIVE 10/12/2024 1636   LEUKOCYTESUR NEGATIVE 10/12/2024 1636    Radiological Exams on Admission: I have personally reviewed images DG Abd Portable 1V-Small Bowel Protocol-Position Verification Result Date: 10/13/2024 EXAM: 1 VIEW XRAY OF THE ABDOMEN 10/13/2024 12:08:00 AM COMPARISON: None available. CLINICAL HISTORY: Encounter for imaging study to confirm nasogastric (NG) tube placement. S/p NG tube placement. FINDINGS: LINES, TUBES AND DEVICES: Enteric tube tip and side port in the stomach. BOWEL: Dilated small bowel in the upper abdomen. SOFT TISSUES: Contrast in the renal collecting systems. BONES: No acute osseous abnormality. Lungs: Atelectasis or infiltrates in the lower lungs. LUNGS: Patchy atelectasis or infiltrates in the lower lungs. IMPRESSION: 1. Enteric tube tip and side port in the stomach. Electronically signed by: Norman Gatlin MD 10/13/2024 12:13 AM EDT RP Workstation: [id number]   CT ABDOMEN PELVIS W CONTRAST Result Date: 10/12/2024 EXAM: CT ABDOMEN AND PELVIS WITH CONTRAST 10/12/2024 09:24:30 PM TECHNIQUE: CT of the abdomen and pelvis was performed with the administration of 100 mL of iohexol  (OMNIPAQUE ) 300 MG/ML solution. Multiplanar reformatted images are provided for review. Automated exposure control, iterative reconstruction,  and/or weight-based adjustment of the mA/kV was utilized to reduce the radiation dose to as low as reasonably achievable. COMPARISON: None available. CLINICAL HISTORY: LLQ abdominal pain; diffuse lower abd pain. Per chart: Pt. Reports hernia operation several months prior. Today c/o abdominal  pain at and around incision site including N/V, similar to episodes experienced prior. Denies diarrhea and fever. FINDINGS: LOWER CHEST: Trace paraseptal emphysematous changes. Partially visualized stable aneurysmal ascending thoracic aorta (4 cm). LIVER: The liver is unremarkable. GALLBLADDER AND BILE DUCTS: Gallbladder is unremarkable. No biliary ductal dilatation. SPLEEN: No acute abnormality. PANCREAS: No acute abnormality. ADRENAL GLANDS: No acute abnormality. KIDNEYS, URETERS AND BLADDER: No stones in the kidneys or ureters. No hydronephrosis. No perinephric or periureteral stranding. Markedly limited evaluation of the urinary bladder due to decompression and streak artifact. GI AND BOWEL: Stomach demonstrates no acute abnormality. Multiple loops of small bowel are dilated with fluid overlying associated air-fluid levels. Small bowel caliber measures up to 4.5 cm with a transition point within the left anterior mid abdomen (2:51). No pneumatosis. No bowel wall thickening. The large bowel is decompressed. No small bowel thickening of the large bowel. The appendix is unremarkable. PERITONEUM AND RETROPERITONEUM: No ascites. No free air. VASCULATURE: Aorta is normal in caliber. Moderate atherosclerotic plaque. LYMPH NODES: No lymphadenopathy. REPRODUCTIVE ORGANS: Limited evaluation of the prostate due to streak artifacts. BONES AND SOFT TISSUES: Total right hip arthroplasty partially visualized. Multilevel severe degenerative change of the spine with multilevel stable vertebral body height loss. No acute osseous abnormality. No focal soft tissue abnormality. IMPRESSION: 1. High grade small bowel obstruction with a transition  point in the left anterior mid abdomen. 2. Stable aneurysmal ascending thoracic aorta (4 cm).  Recommend annual imaging followup by CTA or MRA.  This recommendation follows 2010 ACCF/AHA/AATS/ACR/ASA/SCA/SCAI/SIR/STS/SVM Guidelines for the Diagnosis and Management of Patients with Thoracic Aortic Disease. Circulation. 2010; 121: Z733-z630.  Aortic aneurysm NOS (ICD10-I71.9) Electronically signed by: Kate Plummer MD 10/12/2024 09:38 PM EDT RP Workstation: [id number]      Assessment/Plan Principal Problem:   SBO (small bowel obstruction) (HCC)    ##Small Bowel Obstruction CT Abdomen shows high-grade small bowel obstruction with a transition point in the left anterior mid abdomen. He does report Umbilical hernia repair in May 2025.He has a history of 2 small bowel obstructions since hernia repair in May and was recently admitted earlier this month with an ileus.   Patient does not currently have signs/symptoms of bowel ischemia or peritonitis.  - Keep NPO (small amount of ice chips for patient comfort OK)  - NGT clamped - MIVF at 125 mL/hr - Daily BMP + Mg - PRN electrolyte correction for goal K >4 and Mg >2 - Analgesia as needed - PRN antiemetics - General surgery consulted, appreciate their recommendations. Anticipate supportive care/medical management.  #Hypertension - Takes 10 mg Amlodipine  daily at home, hold while NPO - PRN hydralazine    VTE prophylaxis:  Lovenox   GI prophylaxis: Not indicated  Diet: NPO Access: PIV Lines: None Code Status:  Full Code Telemetry: Yes  Admission status: Inpatient, Telemetry bed Patient is from: Home  Anticipated d/c is to: Home  Anticipated d/c date is: 2-3 days   Family Communication: No family at bedside. Plan of care discussed with patient at bedside.  Questions welcomed and elicited.  Questions answered to patient's expressed satisfaction.  Patient is cognitively able to update family independently if desired.    Consults called: Dr  Debby, General Surgery consulted by EDP    Severity of Illness: The appropriate patient status for this patient is INPATIENT. Inpatient status is judged to be reasonable and necessary in order to provide the required intensity of service to ensure the patient's safety. The patient's presenting symptoms, physical exam findings, and initial radiographic and  laboratory data in the context of their chronic comorbidities is felt to place them at high risk for further clinical deterioration. Furthermore, it is not anticipated that the patient will be medically stable for discharge from the hospital within 2 midnights of admission.   * I certify that at the point of admission it is my clinical judgment that the patient will require inpatient hospital care spanning beyond 2 midnights from the point of admission due to high intensity of service, high risk for further deterioration and high frequency of surveillance required.*  To reach the provider On-Call:   7AM- 7PM see care teams to locate the attending and reach out to them via [email or web address] Password: [email or web address] 7PM-7AM contact night-coverage If you still have difficulty reaching the appropriate provider, please page the [facility] (Director on Call) for [facility] on amion for assistance  This document was prepared using Conservation officer, historic buildings and may include unintentional dictation errors.  Rockie Rams FNP-BC, PMHNP-BC Nurse Practitioner [facility]

## 2024-10-13 NOTE — Care Management Obs Status (Signed)
 MEDICARE OBSERVATION STATUS NOTIFICATION   Patient Details  Name: Jared Rhodes MRN: [id number] Date of Birth: 12/24/1951   Medicare Observation Status Notification Given:  Yes    Nena LITTIE Coffee, RN 10/13/2024, 1:23 PM

## 2024-10-13 NOTE — Care Management CC44 (Signed)
 Condition Code 44 Documentation Completed  Patient Details  Name: Jared Rhodes. MRN: [id number] Date of Birth: 02-Jan-1951   Condition Code 44 given:  Yes Patient signature on Condition Code 44 notice:  Yes Documentation of 2 MD's agreement:  Yes Code 44 added to claim:  Yes    Nena LITTIE Coffee, RN 10/13/2024, 1:23 PM

## 2024-10-13 NOTE — Discharge Summary (Signed)
 Physician Discharge Summary   Patient: Jared Rhodes MRN: [id number] DOB: October 04, 1951  Admit date:     10/12/2024  Discharge date: 10/13/24  Discharge Physician: Yetta Blanch  PCP: [name], NP  Recommendations at discharge: Follow-up with surgery as recommended. Follow-up with PCP.   Follow-up Information     [name], NP Follow up.   Specialty: Internal Medicine Why: the Appointment As Scheduled Contact information: [address] [place] [address] [phone]         Vernetta Berg, MD. Go to.   Specialty: General Surgery Why: the Appointment As Scheduled Contact information: [address] [place] [address] [phone]                 Hospital Course: Patient presented to the hospital with complaints of abdominal pain nausea and vomiting. Has history of hernia repair followed by recurrent small bowel obstruction. CT scan shows evidence of high-grade small bowel obstruction. NG tube was inserted for decompression. Patient was started on small bowel protocol. General surgery was consulted. Repeat x-ray shows evidence of progression of the contrast all the way to the colon. Patient with wanted to go home and threatened twice to leave AMA. Discussed with patient. Given that his pain has resolved.  Nausea is resolved.  Will advance diet. Patient was able to tolerate soft diet without any nausea or vomiting. No abdominal pain reported as well by the patient. Patient went for a bowel movement and actually had a bowel movement. Patient was eager to go home. Patient already has a scheduled follow-up appointment with general surgery outpatient. Continue bowel regimen.  No other change in medication recommended .  Consultants:  General surgery  Procedures performed:  None  DISCHARGE MEDICATION: Allergies as of 10/13/2024       Reactions   Penicillins Rash        Medication List     STOP  taking these medications    metoCLOPramide  5 MG tablet Commonly known as: REGLAN        TAKE these medications    acetaminophen  325 MG tablet Commonly known as: TYLENOL  Take 650 mg by mouth daily as needed for moderate pain (pain score 4-6) or mild pain (pain score 1-3).   amLODipine  10 MG tablet Commonly known as: NORVASC  Take 1 tablet (10 mg total) by mouth daily.   docusate sodium  100 MG capsule Commonly known as: COLACE Take 1 capsule (100 mg total) by mouth daily.   ondansetron  4 MG tablet Commonly known as: Zofran  Take 1 tablet (4 mg total) by mouth every 8 (eight) hours as needed for nausea or vomiting.   oxybutynin 5 MG tablet Commonly known as: DITROPAN Take 5 mg by mouth daily.       Disposition: Home Diet recommendation: Regular diet  Discharge Exam: Vitals:   10/13/24 0230 10/13/24 0600 10/13/24 0948 10/13/24 1406  BP: 117/76 132/86 (!) 138/108 124/78  Pulse: 65 62 69 66  Resp: 16 16 16    Temp:  98 F (36.7 C) 97.7 F (36.5 C) 98 F (36.7 C)  TempSrc:  Oral Oral Oral  SpO2: 99% 100% 98% 99%   Clear to auscultation good S1-S2 present Bowel sound present Nontender. No edema.  Condition at discharge: stable  The results of significant diagnostics from this hospitalization (including imaging, microbiology, ancillary and laboratory) are listed below for reference.   Imaging Studies: DG Abd Portable 1V-Small Bowel Obstruction Protocol-initial, 8 hr delay Result Date: 10/13/2024 EXAM: 1 VIEW  XRAY OF THE ABDOMEN 10/13/2024 09:11:00 AM COMPARISON: 10/13/2024 CLINICAL HISTORY: Small bowel obstruction (HCC); 8 hr delay protocol FINDINGS: LINES, TUBES AND DEVICES: Enteric tube stable in place within the stomach. BOWEL: Improved bowel gas pattern with distended mid small bowel loops. Administered oral contrast now mostly present throughout the large bowel including distally in the rectum. There is some flocculated small bowel contrast remaining in the mid  abdomen. SOFT TISSUES: No opaque urinary calculi. BONES: Right hip arthroplasty noted. IMPRESSION: 1. Improved bowel gas pattern. And administered oral contrast has reached the distal large bowel. Some flocculated small bowel contrast persists in the mid abdomen. 2. Stable enteric tube. Electronically signed by: Helayne Hurst MD 10/13/2024 09:29 AM EDT RP Workstation: [id number]   DG Abd Portable 1V-Small Bowel Protocol-Position Verification Result Date: 10/13/2024 EXAM: 1 VIEW XRAY OF THE ABDOMEN 10/13/2024 12:08:00 AM COMPARISON: None available. CLINICAL HISTORY: Encounter for imaging study to confirm nasogastric (NG) tube placement. S/p NG tube placement. FINDINGS: LINES, TUBES AND DEVICES: Enteric tube tip and side port in the stomach. BOWEL: Dilated small bowel in the upper abdomen. SOFT TISSUES: Contrast in the renal collecting systems. BONES: No acute osseous abnormality. Lungs: Atelectasis or infiltrates in the lower lungs. LUNGS: Patchy atelectasis or infiltrates in the lower lungs. IMPRESSION: 1. Enteric tube tip and side port in the stomach. Electronically signed by: Norman Gatlin MD 10/13/2024 12:13 AM EDT RP Workstation: [id number]   CT ABDOMEN PELVIS W CONTRAST Result Date: 10/12/2024 EXAM: CT ABDOMEN AND PELVIS WITH CONTRAST 10/12/2024 09:24:30 PM TECHNIQUE: CT of the abdomen and pelvis was performed with the administration of 100 mL of iohexol  (OMNIPAQUE ) 300 MG/ML solution. Multiplanar reformatted images are provided for review. Automated exposure control, iterative reconstruction, and/or weight-based adjustment of the mA/kV was utilized to reduce the radiation dose to as low as reasonably achievable. COMPARISON: None available. CLINICAL HISTORY: LLQ abdominal pain; diffuse lower abd pain. Per chart: Pt. Reports hernia operation several months prior. Today c/o abdominal pain at and around incision site including N/V, similar to episodes experienced prior. Denies diarrhea and fever. FINDINGS:  LOWER CHEST: Trace paraseptal emphysematous changes. Partially visualized stable aneurysmal ascending thoracic aorta (4 cm). LIVER: The liver is unremarkable. GALLBLADDER AND BILE DUCTS: Gallbladder is unremarkable. No biliary ductal dilatation. SPLEEN: No acute abnormality. PANCREAS: No acute abnormality. ADRENAL GLANDS: No acute abnormality. KIDNEYS, URETERS AND BLADDER: No stones in the kidneys or ureters. No hydronephrosis. No perinephric or periureteral stranding. Markedly limited evaluation of the urinary bladder due to decompression and streak artifact. GI AND BOWEL: Stomach demonstrates no acute abnormality. Multiple loops of small bowel are dilated with fluid overlying associated air-fluid levels. Small bowel caliber measures up to 4.5 cm with a transition point within the left anterior mid abdomen (2:51). No pneumatosis. No bowel wall thickening. The large bowel is decompressed. No small bowel thickening of the large bowel. The appendix is unremarkable. PERITONEUM AND RETROPERITONEUM: No ascites. No free air. VASCULATURE: Aorta is normal in caliber. Moderate atherosclerotic plaque. LYMPH NODES: No lymphadenopathy. REPRODUCTIVE ORGANS: Limited evaluation of the prostate due to streak artifacts. BONES AND SOFT TISSUES: Total right hip arthroplasty partially visualized. Multilevel severe degenerative change of the spine with multilevel stable vertebral body height loss. No acute osseous abnormality. No focal soft tissue abnormality. IMPRESSION: 1. High grade small bowel obstruction with a transition point in the left anterior mid abdomen. 2. Stable aneurysmal ascending thoracic aorta (4 cm).  Recommend annual imaging followup by CTA or MRA.  This recommendation follows 2010 ACCF/AHA/AATS/ACR/ASA/SCA/SCAI/SIR/STS/SVM  Guidelines for the Diagnosis and Management of Patients with Thoracic Aortic Disease. Circulation. 2010; 121: Z733-z630.  Aortic aneurysm NOS (ICD10-I71.9) Electronically signed by: Kate Plummer MD 10/12/2024 09:38 PM EDT RP Workstation: [id number]   CT ABDOMEN PELVIS W CONTRAST Result Date: 10/03/2024 CLINICAL DATA:  One day history of abdominal pain and vomiting. History of incisional hernia repair 04/30/2024. EXAM: CT ABDOMEN AND PELVIS WITH CONTRAST TECHNIQUE: Multidetector CT imaging of the abdomen and pelvis was performed using the standard protocol following bolus administration of intravenous contrast. RADIATION DOSE REDUCTION: This exam was performed according to the departmental dose-optimization program which includes automated exposure control, adjustment of the mA and/or kV according to patient size and/or use of iterative reconstruction technique. CONTRAST:  OMNIPAQUE  IOHEXOL  300 MG/ML  SOLN COMPARISON:  CT abdomen and pelvis dated 05/20/2024 FINDINGS: Lower chest: No focal consolidation or pulmonary nodule in the lung bases. No pleural effusion or pneumothorax demonstrated. Partially imaged heart size is normal. Dilated ascending thoracic aorta measures 4.0 cm. Hepatobiliary: No focal hepatic lesions. No intra or extrahepatic biliary ductal dilation. Normal gallbladder. Pancreas: No focal lesions or main ductal dilation. Spleen: Normal in size without focal abnormality. Adrenals/Urinary Tract: No adrenal nodules. No suspicious renal mass, calculi or hydronephrosis. Underdistended urinary bladder contains a small amount of excreted contrast material. Stomach/Bowel: Normal appearance of the stomach. Segmental loops of dilated small bowel without areas of abrupt caliber narrowing. Mild mural thickening of lower midline small bowel loops. Appendix is not discretely seen. Vascular/Lymphatic: Aortic atherosclerosis. No enlarged abdominal or pelvic lymph nodes. Reproductive: Prostatectomy. Other: No free fluid, fluid collection, or free air. Small area of irregular soft tissue thickening deep to the umbilicus (12:60). Musculoskeletal: No acute or abnormal lytic or blastic osseous  lesions. Status post right hip arthroplasty. Postsurgical changes of the anterior abdominal wall. Multilevel degenerative changes of the partially imaged thoracic and lumbar spine. Unchanged vertebral body height loss of T12, L2, and L4. IMPRESSION: 1. Segmental loops of dilated small bowel without areas of abrupt caliber narrowing, likely ileus. Mild mural thickening of lower midline small bowel loops, which may represent enteritis. 2. Small area of irregular soft tissue thickening deep to the umbilicus, likely postsurgical. 3. Dilated ascending thoracic aorta measures 4.0 cm. Recommend annual imaging followup by CTA or MRA. This recommendation follows 2010 ACCF/AHA/AATS/ACR/ASA/SCA/SCAI/SIR/STS/SVM Guidelines for the Diagnosis and Management of Patients with Thoracic Aortic Disease. Circulation. 2010; 121: Z733-z630. Aortic aneurysm NOS (ICD10-I71.9) 4.  Aortic Atherosclerosis (ICD10-I70.0). Electronically Signed   By: Limin  Xu M.D.   On: 10/03/2024 15:56    Microbiology: Results for orders placed or performed in visit on 09/22/21  SARS Coronavirus 2 (TAT 6-24 hrs)     Status: None   Collection Time: 09/22/21 12:00 AM  Result Value Ref Range Status   SARS Coronavirus 2 RESULT: NEGATIVE  Corrected    Comment: RESULT: NEGATIVESARS-CoV-2 INTERPRETATION:A NEGATIVE  test result means that SARS-CoV-2 RNA was not present in the specimen above the limit of detection of this test. This does not preclude a possible SARS-CoV-2 infection and should not be used as the  sole basis for patient management decisions. Negative results must be combined with clinical observations, patient history, and epidemiological information. Optimum specimen types and timing for peak viral levels during infections caused by SARS-CoV-2  have not been determined. Collection of multiple specimens or types of specimens may be necessary to detect virus. Improper specimen collection and handling, sequence variability under primers/probes,  or organism present below the limit of  detection may  lead to false negative results. Positive and negative predictive values of testing are highly dependent on prevalence. False negative test results are more likely when prevalence of disease is high.The expected result is NEGATIVE.Fact S heet for  Healthcare Providers: [employer] Sheet for Patients: https://poole-fre[employer].org/ Reference Range - Negative    Labs: CBC: Recent Labs  Lab 10/12/24 1636 10/12/24 2057 10/13/24 0545  WBC 5.3  --  4.1  NEUTROABS  --   --  2.5  HGB 13.7 14.6 11.9*  HCT 43.9 43.0 38.6*  MCV 89.8  --  90.6  PLT 250  --  218   Basic Metabolic Panel: Recent Labs  Lab 10/12/24 2050 10/12/24 2057 10/13/24 0545  NA 138 141 143  K 4.0 4.1 3.7  CL 104 104 108  CO2 24  --  24  GLUCOSE 96 101* 87  BUN 16 17 14   CREATININE 0.79 0.90 0.70  CALCIUM 9.7  --  8.9  MG  --   --  2.3   Liver Function Tests: Recent Labs  Lab 10/12/24 2050 10/13/24 0545  AST 21 18  ALT 13 10  ALKPHOS 59 51  BILITOT 0.5 0.5  PROT 8.1 7.0  ALBUMIN 4.2 3.8   CBG: No results for input(s): GLUCAP in the last 168 hours.  Discharge time spent: greater than 30 minutes.  Author: Yetta Blanch, MD  [facility] 10/13/2024

## 2024-10-13 NOTE — ED Notes (Signed)
 Patient placed on 2L [place] as patient's O2 dropped while sleeping to 85%

## 2024-10-13 NOTE — Consult Note (Signed)
 CC: nausea, abd pain  Requesting provider: Dr Ruthe  HPI: Jared Rhodes  [name] is an 73 y.o. male who is presented to the ED with abd pain and nausea.  He underwent workup in Ed and was noted to have a SBO.  He has a h/o SBO and incisional hernia repair last year.  More remote h/o pelvic lymphadenectomy.    Past Medical History:  Diagnosis Date   Hypertension    Prostate cancer (HCC) 2018   radation 40 treatments     Past Surgical History:  Procedure Laterality Date   COLONOSCOPY     I & D EXTREMITY Left 07/17/2014   Procedure: IRRIGATION AND DEBRIDEMENT EXTREMITY;  Surgeon: Kay Ozell Cummins, MD;  Location: [facility] OR;  Service: Orthopedics;  Laterality: Left;   I & D EXTREMITY Left 07/19/2014   Procedure: IRRIGATION AND DEBRIDEMENT EXTREMITY;  Surgeon: Kay Ozell Cummins, MD;  Location: [facility] OR;  Service: Orthopedics;  Laterality: Left;   INCISIONAL HERNIA REPAIR N/A 04/30/2024   Procedure: REPAIR, HERNIA, INCISIONAL;  Surgeon: Vernetta Berg, MD;  Location: [facility] OR;  Service: General;  Laterality: N/A;   INSERTION OF MESH N/A 04/30/2024   Procedure: INSERTION OF MESH;  Surgeon: Vernetta Berg, MD;  Location: [facility] OR;  Service: General;  Laterality: N/A;   LYMPHADENECTOMY Bilateral 09/25/2021   Procedure: REDGIE, PELVIC;  Surgeon: Renda Glance, MD;  Location: [facility] ORS;  Service: Urology;  Laterality: Bilateral;   ORIF ANKLE FRACTURE Left 07/19/2014   Procedure: OPEN REDUCTION INTERNAL FIXATION (ORIF) ANKLE FRACTURE;  Surgeon: Kay Ozell Cummins, MD;  Location: [facility] OR;  Service: Orthopedics;  Laterality: Left;   PROSTATE BIOPSY     ROBOT ASSISTED LAPAROSCOPIC RADICAL PROSTATECTOMY N/A 09/25/2021   Procedure: XI ROBOTIC ASSISTED LAPAROSCOPIC RADICAL PROSTATECTOMY LEVEL 3;  Surgeon: Renda Glance, MD;  Location: [facility] ORS;  Service: Urology;  Laterality: N/A;   TONSILLECTOMY     TOTAL HIP ARTHROPLASTY Right 03/02/2016   Procedure: RIGHT TOTAL HIP ARTHROPLASTY ANTERIOR  APPROACH;  Surgeon: Kay CHRISTELLA Cummins, MD;  Location: [facility] OR;  Service: Orthopedics;  Laterality: Right;    Family History  Problem Relation Age of Onset   Alcohol abuse Mother    Heart disease Sister    Hyperlipidemia Brother    Colon cancer Neg Hx    Breast cancer Neg Hx    Prostate cancer Neg Hx    Pancreatic cancer Neg Hx    Colon polyps Neg Hx    Esophageal cancer Neg Hx    Rectal cancer Neg Hx    Stomach cancer Neg Hx     Social:  reports that he has been smoking cigarettes. He has a 30 pack-year smoking history. He has never used smokeless tobacco. He reports current alcohol use of about 6.0 standard drinks of alcohol per week. He reports that he does not use drugs.  Allergies:  Allergies  Allergen Reactions   Penicillins Rash    Medications: I have reviewed the patient's current medications.  Results for orders placed or performed during the hospital encounter of 10/12/24 (from the past 48 hours)  CBC     Status: None   Collection Time: 10/12/24  4:36 PM  Result Value Ref Range   WBC 5.3 4.0 - 10.5 K/uL   RBC 4.89 4.22 - 5.81 MIL/uL   Hemoglobin 13.7 13.0 - 17.0 g/dL   HCT 56.0 60.9 - 47.9 %   MCV 89.8 80.0 - 100.0 fL   MCH 28.0 26.0 - 34.0 pg  MCHC 31.2 30.0 - 36.0 g/dL   RDW 85.3 88.4 - 84.4 %   Platelets 250 150 - 400 K/uL   nRBC 0.0 0.0 - 0.2 %    Comment: Performed at [facility], [address] [place], [place] [address]  Urinalysis, Routine w reflex microscopic -Urine, Clean Catch     Status: Abnormal   Collection Time: 10/12/24  4:36 PM  Result Value Ref Range   Color, Urine YELLOW YELLOW   APPearance CLEAR CLEAR   Specific Gravity, Urine 1.029 1.005 - 1.030   pH 5.0 5.0 - 8.0   Glucose, UA NEGATIVE NEGATIVE mg/dL   Hgb urine dipstick NEGATIVE NEGATIVE   Bilirubin Urine NEGATIVE NEGATIVE   Ketones, ur 5 (A) NEGATIVE mg/dL   Protein, ur NEGATIVE NEGATIVE mg/dL   Nitrite NEGATIVE NEGATIVE   Leukocytes,Ua NEGATIVE NEGATIVE     Comment: Performed at [facility], [address] [place], [place] [address]  Comprehensive metabolic panel     Status: None   Collection Time: 10/12/24  8:50 PM  Result Value Ref Range   Sodium 138 135 - 145 mmol/L   Potassium 4.0 3.5 - 5.1 mmol/L   Chloride 104 98 - 111 mmol/L   CO2 24 22 - 32 mmol/L   Glucose, Bld 96 70 - 99 mg/dL    Comment: Glucose reference range applies only to samples taken after fasting for at least 8 hours.   BUN 16 8 - 23 mg/dL   Creatinine, Ser 9.20 0.61 - 1.24 mg/dL   Calcium 9.7 8.9 - 89.6 mg/dL   Total Protein 8.1 6.5 - 8.1 g/dL   Albumin 4.2 3.5 - 5.0 g/dL   AST 21 15 - 41 U/L   ALT 13 0 - 44 U/L   Alkaline Phosphatase 59 38 - 126 U/L   Total Bilirubin 0.5 0.0 - 1.2 mg/dL   GFR, Estimated >39 >39 mL/min    Comment: (NOTE) Calculated using the CKD-EPI Creatinine Equation (2021)    Anion gap 11 5 - 15    Comment: Performed at [facility], [address], [place], [place] [address]  Lipase, blood     Status: None   Collection Time: 10/12/24  8:50 PM  Result Value Ref Range   Lipase 32 11 - 51 U/L    Comment: Performed at [facility], [address] [place], [place] [address]  I-stat chem 8, ED (not at [facility], [facility] or [facility])     Status: Abnormal   Collection Time: 10/12/24  8:57 PM  Result Value Ref Range   Sodium 141 135 - 145 mmol/L   Potassium 4.1 3.5 - 5.1 mmol/L   Chloride 104 98 - 111 mmol/L   BUN 17 8 - 23 mg/dL   Creatinine, Ser 9.09 0.61 - 1.24 mg/dL   Glucose, Bld 898 (H) 70 - 99 mg/dL    Comment: Glucose reference range applies only to samples taken after fasting for at least 8 hours.   Calcium, Ion 1.18 1.15 - 1.40 mmol/L   TCO2 25 22 - 32 mmol/L   Hemoglobin 14.6 13.0 - 17.0 g/dL   HCT 56.9 60.9 - 47.9 %  CBC with Differential/Platelet     Status: Abnormal   Collection Time: 10/13/24  5:45 AM  Result Value Ref Range   WBC 4.1 4.0 - 10.5 K/uL   RBC 4.26 4.22 - 5.81 MIL/uL    Hemoglobin 11.9 (L) 13.0 - 17.0 g/dL  HCT 38.6 (L) 39.0 - 52.0 %   MCV 90.6 80.0 - 100.0 fL   MCH 27.9 26.0 - 34.0 pg   MCHC 30.8 30.0 - 36.0 g/dL   RDW 85.1 88.4 - 84.4 %   Platelets 218 150 - 400 K/uL   nRBC 0.0 0.0 - 0.2 %   Neutrophils Relative % 61 %   Neutro Abs 2.5 1.7 - 7.7 K/uL   Lymphocytes Relative 27 %   Lymphs Abs 1.1 0.7 - 4.0 K/uL   Monocytes Relative 9 %   Monocytes Absolute 0.4 0.1 - 1.0 K/uL   Eosinophils Relative 3 %   Eosinophils Absolute 0.1 0.0 - 0.5 K/uL   Basophils Relative 0 %   Basophils Absolute 0.0 0.0 - 0.1 K/uL   Immature Granulocytes 0 %   Abs Immature Granulocytes 0.01 0.00 - 0.07 K/uL    Comment: Performed at [facility], [address], [place], [place] [address]  Comprehensive metabolic panel with GFR     Status: None   Collection Time: 10/13/24  5:45 AM  Result Value Ref Range   Sodium 143 135 - 145 mmol/L   Potassium 3.7 3.5 - 5.1 mmol/L   Chloride 108 98 - 111 mmol/L   CO2 24 22 - 32 mmol/L   Glucose, Bld 87 70 - 99 mg/dL    Comment: Glucose reference range applies only to samples taken after fasting for at least 8 hours.   BUN 14 8 - 23 mg/dL   Creatinine, Ser 9.29 0.61 - 1.24 mg/dL   Calcium 8.9 8.9 - 89.6 mg/dL   Total Protein 7.0 6.5 - 8.1 g/dL   Albumin 3.8 3.5 - 5.0 g/dL   AST 18 15 - 41 U/L   ALT 10 0 - 44 U/L   Alkaline Phosphatase 51 38 - 126 U/L   Total Bilirubin 0.5 0.0 - 1.2 mg/dL   GFR, Estimated >39 >39 mL/min    Comment: (NOTE) Calculated using the CKD-EPI Creatinine Equation (2021)    Anion gap 11 5 - 15    Comment: Performed at [facility], [address], [place], [place] [address]  Magnesium      Status: None   Collection Time: 10/13/24  5:45 AM  Result Value Ref Range   Magnesium  2.3 1.7 - 2.4 mg/dL    Comment: Performed at [facility], [address], [place], [place] [address]    DG Abd Portable 1V-Small Bowel Protocol-Position  Verification Result Date: 10/13/2024 EXAM: 1 VIEW XRAY OF THE ABDOMEN 10/13/2024 12:08:00 AM COMPARISON: None available. CLINICAL HISTORY: Encounter for imaging study to confirm nasogastric (NG) tube placement. S/p NG tube placement. FINDINGS: LINES, TUBES AND DEVICES: Enteric tube tip and side port in the stomach. BOWEL: Dilated small bowel in the upper abdomen. SOFT TISSUES: Contrast in the renal collecting systems. BONES: No acute osseous abnormality. Lungs: Atelectasis or infiltrates in the lower lungs. LUNGS: Patchy atelectasis or infiltrates in the lower lungs. IMPRESSION: 1. Enteric tube tip and side port in the stomach. Electronically signed by: Norman Gatlin MD 10/13/2024 12:13 AM EDT RP Workstation: [id number]   CT ABDOMEN PELVIS W CONTRAST Result Date: 10/12/2024 EXAM: CT ABDOMEN AND PELVIS WITH CONTRAST 10/12/2024 09:24:30 PM TECHNIQUE: CT of the abdomen and pelvis was performed with the administration of 100 mL of iohexol  (OMNIPAQUE ) 300 MG/ML solution. Multiplanar reformatted images are provided for review. Automated exposure control, iterative reconstruction, and/or weight-based adjustment of the mA/kV was utilized to reduce the radiation  dose to as low as reasonably achievable. COMPARISON: None available. CLINICAL HISTORY: LLQ abdominal pain; diffuse lower abd pain. Per chart: Pt. Reports hernia operation several months prior. Today c/o abdominal pain at and around incision site including N/V, similar to episodes experienced prior. Denies diarrhea and fever. FINDINGS: LOWER CHEST: Trace paraseptal emphysematous changes. Partially visualized stable aneurysmal ascending thoracic aorta (4 cm). LIVER: The liver is unremarkable. GALLBLADDER AND BILE DUCTS: Gallbladder is unremarkable. No biliary ductal dilatation. SPLEEN: No acute abnormality. PANCREAS: No acute abnormality. ADRENAL GLANDS: No acute abnormality. KIDNEYS, URETERS AND BLADDER: No stones in the kidneys or ureters. No hydronephrosis.  No perinephric or periureteral stranding. Markedly limited evaluation of the urinary bladder due to decompression and streak artifact. GI AND BOWEL: Stomach demonstrates no acute abnormality. Multiple loops of small bowel are dilated with fluid overlying associated air-fluid levels. Small bowel caliber measures up to 4.5 cm with a transition point within the left anterior mid abdomen (2:51). No pneumatosis. No bowel wall thickening. The large bowel is decompressed. No small bowel thickening of the large bowel. The appendix is unremarkable. PERITONEUM AND RETROPERITONEUM: No ascites. No free air. VASCULATURE: Aorta is normal in caliber. Moderate atherosclerotic plaque. LYMPH NODES: No lymphadenopathy. REPRODUCTIVE ORGANS: Limited evaluation of the prostate due to streak artifacts. BONES AND SOFT TISSUES: Total right hip arthroplasty partially visualized. Multilevel severe degenerative change of the spine with multilevel stable vertebral body height loss. No acute osseous abnormality. No focal soft tissue abnormality. IMPRESSION: 1. High grade small bowel obstruction with a transition point in the left anterior mid abdomen. 2. Stable aneurysmal ascending thoracic aorta (4 cm).  Recommend annual imaging followup by CTA or MRA.  This recommendation follows 2010 ACCF/AHA/AATS/ACR/ASA/SCA/SCAI/SIR/STS/SVM Guidelines for the Diagnosis and Management of Patients with Thoracic Aortic Disease. Circulation. 2010; 121: Z733-z630.  Aortic aneurysm NOS (ICD10-I71.9) Electronically signed by: Kate Plummer MD 10/12/2024 09:38 PM EDT RP Workstation: [id number]    ROS - all of the below systems have been reviewed with the patient and positives are indicated with bold text General: chills, fever or night sweats Eyes: blurry vision or double vision ENT: epistaxis or sore throat Hematologic/Lymphatic: bleeding problems, blood clots or swollen lymph nodes Endocrine: temperature intolerance or unexpected weight  changes Breast: new or changing breast lumps or nipple discharge Resp: cough, shortness of breath, or wheezing CV: chest pain or dyspnea on exertion GI: as per HPI GU: dysuria, trouble voiding, or hematuria Neuro: TIA or stroke symptoms    PE Blood pressure 132/86, pulse 62, temperature 98 F (36.7 C), temperature source Oral, resp. rate 16, SpO2 100%. Constitutional: NAD; conversant; no deformities Eyes: Moist conjunctiva; no lid lag; anicteric; PERRL Neck: Trachea midline; no thyromegaly Lungs: Normal respiratory effort CV: RRR GI: Abd soft, distended.  Mild TTP MSK: Normal range of motion of extremities; no clubbing/cyanosis Psychiatric: Appropriate affect; alert and oriented x3  Results for orders placed or performed during the hospital encounter of 10/12/24 (from the past 48 hours)  CBC     Status: None   Collection Time: 10/12/24  4:36 PM  Result Value Ref Range   WBC 5.3 4.0 - 10.5 K/uL   RBC 4.89 4.22 - 5.81 MIL/uL   Hemoglobin 13.7 13.0 - 17.0 g/dL   HCT 56.0 60.9 - 47.9 %   MCV 89.8 80.0 - 100.0 fL   MCH 28.0 26.0 - 34.0 pg   MCHC 31.2 30.0 - 36.0 g/dL   RDW 85.3 88.4 - 84.4 %   Platelets 250 150 -  400 K/uL   nRBC 0.0 0.0 - 0.2 %    Comment: Performed at [facility], [address] [place], [place] [address]  Urinalysis, Routine w reflex microscopic -Urine, Clean Catch     Status: Abnormal   Collection Time: 10/12/24  4:36 PM  Result Value Ref Range   Color, Urine YELLOW YELLOW   APPearance CLEAR CLEAR   Specific Gravity, Urine 1.029 1.005 - 1.030   pH 5.0 5.0 - 8.0   Glucose, UA NEGATIVE NEGATIVE mg/dL   Hgb urine dipstick NEGATIVE NEGATIVE   Bilirubin Urine NEGATIVE NEGATIVE   Ketones, ur 5 (A) NEGATIVE mg/dL   Protein, ur NEGATIVE NEGATIVE mg/dL   Nitrite NEGATIVE NEGATIVE   Leukocytes,Ua NEGATIVE NEGATIVE    Comment: Performed at [facility], [address], [place], [place] [address]  Comprehensive metabolic  panel     Status: None   Collection Time: 10/12/24  8:50 PM  Result Value Ref Range   Sodium 138 135 - 145 mmol/L   Potassium 4.0 3.5 - 5.1 mmol/L   Chloride 104 98 - 111 mmol/L   CO2 24 22 - 32 mmol/L   Glucose, Bld 96 70 - 99 mg/dL    Comment: Glucose reference range applies only to samples taken after fasting for at least 8 hours.   BUN 16 8 - 23 mg/dL   Creatinine, Ser 9.20 0.61 - 1.24 mg/dL   Calcium 9.7 8.9 - 89.6 mg/dL   Total Protein 8.1 6.5 - 8.1 g/dL   Albumin 4.2 3.5 - 5.0 g/dL   AST 21 15 - 41 U/L   ALT 13 0 - 44 U/L   Alkaline Phosphatase 59 38 - 126 U/L   Total Bilirubin 0.5 0.0 - 1.2 mg/dL   GFR, Estimated >39 >39 mL/min    Comment: (NOTE) Calculated using the CKD-EPI Creatinine Equation (2021)    Anion gap 11 5 - 15    Comment: Performed at [facility], [address], [place], [place] [address]  Lipase, blood     Status: None   Collection Time: 10/12/24  8:50 PM  Result Value Ref Range   Lipase 32 11 - 51 U/L    Comment: Performed at [facility], [address] [place], [place] [address]  I-stat chem 8, ED (not at [facility], [facility] or [facility])     Status: Abnormal   Collection Time: 10/12/24  8:57 PM  Result Value Ref Range   Sodium 141 135 - 145 mmol/L   Potassium 4.1 3.5 - 5.1 mmol/L   Chloride 104 98 - 111 mmol/L   BUN 17 8 - 23 mg/dL   Creatinine, Ser 9.09 0.61 - 1.24 mg/dL   Glucose, Bld 898 (H) 70 - 99 mg/dL    Comment: Glucose reference range applies only to samples taken after fasting for at least 8 hours.   Calcium, Ion 1.18 1.15 - 1.40 mmol/L   TCO2 25 22 - 32 mmol/L   Hemoglobin 14.6 13.0 - 17.0 g/dL   HCT 56.9 60.9 - 47.9 %  CBC with Differential/Platelet     Status: Abnormal   Collection Time: 10/13/24  5:45 AM  Result Value Ref Range   WBC 4.1 4.0 - 10.5 K/uL   RBC 4.26 4.22 - 5.81 MIL/uL   Hemoglobin 11.9 (L) 13.0 - 17.0 g/dL   HCT 61.3 (L) 60.9 - 47.9 %   MCV 90.6 80.0 - 100.0 fL   MCH 27.9 26.0 -  34.0 pg    MCHC 30.8 30.0 - 36.0 g/dL   RDW 85.1 88.4 - 84.4 %   Platelets 218 150 - 400 K/uL   nRBC 0.0 0.0 - 0.2 %   Neutrophils Relative % 61 %   Neutro Abs 2.5 1.7 - 7.7 K/uL   Lymphocytes Relative 27 %   Lymphs Abs 1.1 0.7 - 4.0 K/uL   Monocytes Relative 9 %   Monocytes Absolute 0.4 0.1 - 1.0 K/uL   Eosinophils Relative 3 %   Eosinophils Absolute 0.1 0.0 - 0.5 K/uL   Basophils Relative 0 %   Basophils Absolute 0.0 0.0 - 0.1 K/uL   Immature Granulocytes 0 %   Abs Immature Granulocytes 0.01 0.00 - 0.07 K/uL    Comment: Performed at [facility], [address], [place], [place] [address]  Comprehensive metabolic panel with GFR     Status: None   Collection Time: 10/13/24  5:45 AM  Result Value Ref Range   Sodium 143 135 - 145 mmol/L   Potassium 3.7 3.5 - 5.1 mmol/L   Chloride 108 98 - 111 mmol/L   CO2 24 22 - 32 mmol/L   Glucose, Bld 87 70 - 99 mg/dL    Comment: Glucose reference range applies only to samples taken after fasting for at least 8 hours.   BUN 14 8 - 23 mg/dL   Creatinine, Ser 9.29 0.61 - 1.24 mg/dL   Calcium 8.9 8.9 - 89.6 mg/dL   Total Protein 7.0 6.5 - 8.1 g/dL   Albumin 3.8 3.5 - 5.0 g/dL   AST 18 15 - 41 U/L   ALT 10 0 - 44 U/L   Alkaline Phosphatase 51 38 - 126 U/L   Total Bilirubin 0.5 0.0 - 1.2 mg/dL   GFR, Estimated >39 >39 mL/min    Comment: (NOTE) Calculated using the CKD-EPI Creatinine Equation (2021)    Anion gap 11 5 - 15    Comment: Performed at [facility], [address], [place], [place] [address]  Magnesium      Status: None   Collection Time: 10/13/24  5:45 AM  Result Value Ref Range   Magnesium  2.3 1.7 - 2.4 mg/dL    Comment: Performed at [facility], [address], [place], [place] [address]    DG Abd Portable 1V-Small Bowel Protocol-Position Verification Result Date: 10/13/2024 EXAM: 1 VIEW XRAY OF THE ABDOMEN 10/13/2024 12:08:00 AM COMPARISON: None available. CLINICAL HISTORY:  Encounter for imaging study to confirm nasogastric (NG) tube placement. S/p NG tube placement. FINDINGS: LINES, TUBES AND DEVICES: Enteric tube tip and side port in the stomach. BOWEL: Dilated small bowel in the upper abdomen. SOFT TISSUES: Contrast in the renal collecting systems. BONES: No acute osseous abnormality. Lungs: Atelectasis or infiltrates in the lower lungs. LUNGS: Patchy atelectasis or infiltrates in the lower lungs. IMPRESSION: 1. Enteric tube tip and side port in the stomach. Electronically signed by: Norman Gatlin MD 10/13/2024 12:13 AM EDT RP Workstation: [id number]   CT ABDOMEN PELVIS W CONTRAST Result Date: 10/12/2024 EXAM: CT ABDOMEN AND PELVIS WITH CONTRAST 10/12/2024 09:24:30 PM TECHNIQUE: CT of the abdomen and pelvis was performed with the administration of 100 mL of iohexol  (OMNIPAQUE ) 300 MG/ML solution. Multiplanar reformatted images are provided for review. Automated exposure control, iterative reconstruction, and/or weight-based adjustment of the mA/kV was utilized to reduce the radiation dose to as low as reasonably achievable. COMPARISON: None available. CLINICAL HISTORY: LLQ abdominal pain; diffuse lower abd pain. Per  chart: Pt. Reports hernia operation several months prior. Today c/o abdominal pain at and around incision site including N/V, similar to episodes experienced prior. Denies diarrhea and fever. FINDINGS: LOWER CHEST: Trace paraseptal emphysematous changes. Partially visualized stable aneurysmal ascending thoracic aorta (4 cm). LIVER: The liver is unremarkable. GALLBLADDER AND BILE DUCTS: Gallbladder is unremarkable. No biliary ductal dilatation. SPLEEN: No acute abnormality. PANCREAS: No acute abnormality. ADRENAL GLANDS: No acute abnormality. KIDNEYS, URETERS AND BLADDER: No stones in the kidneys or ureters. No hydronephrosis. No perinephric or periureteral stranding. Markedly limited evaluation of the urinary bladder due to decompression and streak artifact. GI AND  BOWEL: Stomach demonstrates no acute abnormality. Multiple loops of small bowel are dilated with fluid overlying associated air-fluid levels. Small bowel caliber measures up to 4.5 cm with a transition point within the left anterior mid abdomen (2:51). No pneumatosis. No bowel wall thickening. The large bowel is decompressed. No small bowel thickening of the large bowel. The appendix is unremarkable. PERITONEUM AND RETROPERITONEUM: No ascites. No free air. VASCULATURE: Aorta is normal in caliber. Moderate atherosclerotic plaque. LYMPH NODES: No lymphadenopathy. REPRODUCTIVE ORGANS: Limited evaluation of the prostate due to streak artifacts. BONES AND SOFT TISSUES: Total right hip arthroplasty partially visualized. Multilevel severe degenerative change of the spine with multilevel stable vertebral body height loss. No acute osseous abnormality. No focal soft tissue abnormality. IMPRESSION: 1. High grade small bowel obstruction with a transition point in the left anterior mid abdomen. 2. Stable aneurysmal ascending thoracic aorta (4 cm).  Recommend annual imaging followup by CTA or MRA.  This recommendation follows 2010 ACCF/AHA/AATS/ACR/ASA/SCA/SCAI/SIR/STS/SVM Guidelines for the Diagnosis and Management of Patients with Thoracic Aortic Disease. Circulation. 2010; 121: Z733-z630.  Aortic aneurysm NOS (ICD10-I71.9) Electronically signed by: Kate Plummer MD 10/12/2024 09:38 PM EDT RP Workstation: [id number]     A/P: Zachary Tere Raddle. is an 73 y.o. male with SBO s/p multiple abd surgeries.  No acute surgical needs.  NG inserted.  Will start SBO protocol.     Bernarda JAYSON Ned, MD  Colorectal and General Surgery [facility]   moderate decision making.

## 2024-10-13 NOTE — ED Notes (Signed)
 Patient states that he needs to use the restroom and request RN to disconnect IV line /pump. Pt encouraged to transport with equipment. He states he is fearful that line may come out. Line disconnected while pt is in the restroom.

## 2024-10-13 NOTE — Telephone Encounter (Signed)
 Lung Cancer Screening Narrative/Criteria Questionnaire (Cigarette Smokers Only- No Cigars/Pipes/vapes)   Jared Zingg  Jr.   SDMV:10/21/24 at 1130a/Jared Rhodes                                           1951-06-14               LDCT: 10/23/24 at 11a/315 Jared Rhodes    73 y.o.   Phone: [phone]   Lung Screening Narrative (confirm age 60-77 yrs Medicare / 50-80 yrs Private pay insurance)   Insurance information:[employer]   Referring Provider:Hospital provider   This screening involves an initial phone call with a team member from our program. It is called a shared decision making visit. The initial meeting is required by insurance and Medicare to make sure you understand the program. This appointment takes about 15-20 minutes to complete. The CT scan will completed at a separate date/time. This scan takes about 5-10 minutes to complete and you may eat and drink before and after the scan.  Criteria questions for Lung Cancer Screening:   Are you a current or former smoker? Current Age began smoking: 21y   If you are a former smoker, what year did you quit smoking? Minus 3 yrs quit once   To calculate your smoking history, I need an accurate estimate of how many packs of cigarettes you smoked per day and for how many years. (Not just the number of PPD you are now smoking)   Years smoking 49 x Packs per day 1 = Pack years 49   (at least 20 pack yrs)   (Make sure they understand that we need to know how much they have smoked in the past, not just the number of PPD they are smoking now)  Do you have a personal history of cancer?  No    Do you have a family history of cancer? No  Are you coughing up blood?  No  Have you had unexplained weight loss of 15 lbs or more in the last 6 months? No  It looks like you meet all criteria.     Additional information: N/A

## 2024-10-13 NOTE — Progress Notes (Signed)
  Carryover admission to the Day Admitter.  I discussed this case with the EDP, Dr. Ruthe.  Per these discussions:   This is a 73 year old male with history of prior abdominal surgeries as well as history of prior small bowel obstruction, who is being admitted with small bowel obstruction after presenting with abdominal pain associate with nausea, with CT abdomen/pelvis showing evidence of small bowel obstruction with transition point.  EDP d/w on-call general surgery, Dr. Debby, who conveyed that general surgery will consult and requested NGT. NGT is being placed.   I have placed an order for inpatient admission for further evaluation and management of the above.  I have placed some additional preliminary admit orders via the adult multi-morbid admission order set. I have also ordered n.p.o., and have continued existing order for normal saline running at 125 cc/h.  I have ordered prn IV Zofran  as well as prn IV Dilaudid  and have ordered morning labs in the form of CMP, CBC, and magnesium  level.    Eva Pore, DO Hospitalist

## 2024-10-13 NOTE — ED Notes (Signed)
 Patient urinal emptied and patient provided with ice chips.  Patient spoke with NP stating he is troubled with the number of visits and not obtaining solution to problem.  Patient wants to leave but has agreed to stay a few more hours until the surgeon sees him.

## 2024-10-13 NOTE — ED Notes (Signed)
 Dr. Tobie stated that patient to speak with case management then will be discharged to home.  ER charge aware.

## 2024-10-14 ENCOUNTER — Telehealth (INDEPENDENT_AMBULATORY_CARE_PROVIDER_SITE_OTHER): Payer: Self-pay

## 2024-10-14 NOTE — Transitions of Care (Post Inpatient/ED Visit) (Signed)
   10/14/2024  Name: Jared Rhodes  [name] MRN: [id number] DOB: 1951/04/13  Today's TOC FU Call Status: Today's TOC FU Call Status:: Successful TOC FU Call Completed TOC FU Call Complete Date: 10/14/24 Patient's Name and Date of Birth confirmed.  Transition Care Management Follow-up Telephone Call Date of Discharge: 10/13/24 Discharge Facility: [facility]) Type of Discharge: Inpatient Admission Primary Inpatient Discharge Diagnosis:: intestional obstruction How have you been since you were released from the hospital?: Better Any questions or concerns?: No  Items Reviewed: Did you receive and understand the discharge instructions provided?: Yes Medications obtained,verified, and reconciled?: Yes (Medications Reviewed) Any new allergies since your discharge?: No Dietary orders reviewed?: Yes Do you have support at home?: Yes People in Home [RPT]: spouse  Medications Reviewed Today: Medications Reviewed Today     Reviewed by Emmitt Pan, LPN (Licensed Practical Nurse) on 10/14/24 at 1442  Med List Status: <None>   Medication Order Taking? Sig Documenting Provider Last Dose Status Informant  acetaminophen  (TYLENOL ) 325 MG tablet [id number] Yes Take 650 mg by mouth daily as needed for moderate pain (pain score 4-6) or mild pain (pain score 1-3). [provider]  Active Self, Pharmacy Records, Spouse/Significant Other           Med Note STEFFI, ALEXANDRIA   Thu Apr 23, 2024 10:26 AM)    amLODipine  (NORVASC ) 10 MG tablet [id number] Yes Take 1 tablet (10 mg total) by mouth daily. Sebastian Toribio GAILS, MD  Active   docusate sodium  (COLACE) 100 MG capsule [id number] Yes Take 1 capsule (100 mg total) by mouth daily. Tobie Yetta HERO, MD  Active   ondansetron  (ZOFRAN ) 4 MG tablet [id number] Yes Take 1 tablet (4 mg total) by mouth every 8 (eight) hours as needed for nausea or vomiting. Tobie Yetta HERO, MD  Active   oxybutynin (DITROPAN) 5 MG tablet [id number] Yes Take 5 mg by  mouth daily. [provider]  Active Self, Pharmacy Records, Spouse/Significant Other            Home Care and Equipment/Supplies: Were Home Health Services Ordered?: NA Any new equipment or medical supplies ordered?: NA  Functional Questionnaire: Do you need assistance with bathing/showering or dressing?: No Do you need assistance with meal preparation?: No Do you need assistance with eating?: No Do you have difficulty maintaining continence: No Do you need assistance with getting out of bed/getting out of a chair/moving?: No Do you have difficulty managing or taking your medications?: No  Follow up appointments reviewed: PCP Follow-up appointment confirmed?: Yes Date of PCP follow-up appointment?: 11/04/24 Follow-up Provider: [facility] Follow-up appointment confirmed?: No Reason Specialist Follow-Up Not Confirmed: Patient has Specialist Provider Number and will Call for Appointment Do you need transportation to your follow-up appointment?: No Do you understand care options if your condition(s) worsen?: Yes-patient verbalized understanding    SIGNATURE Pan Emmitt, LPN [facility] Nurse Health Advisor Direct Dial [phone]

## 2024-10-20 NOTE — Progress Notes (Unsigned)
  Virtual Visit via Telephone Note  I connected with Kirklin Mcclay  Jr. , 10/20/24 6:50 PM by a telemedicine application and verified that I am speaking with the correct person using two identifiers.  Location: Patient: home Provider: home   I discussed the limitations of evaluation and management by telemedicine and the availability of in person appointments. The patient expressed understanding and agreed to proceed.   Shared Decision Making Visit Lung Cancer Screening Program [phone])   Eligibility: 73 y.o. Pack Years Smoking History Calculation = 49 pack years  (# packs/per year x # years smoked) Recent History of coughing up blood  no Unexplained weight loss? no ( >Than 15 pounds within the last 6 months ) Prior History Lung / other cancer no (Diagnosis within the last 5 years already requiring surveillance chest CT Scans). Smoking Status Current Smoker   Visit Components: Discussion included one or more decision making aids. YES Discussion included risk/benefits of screening. YES Discussion included potential follow up diagnostic testing for abnormal scans. YES Discussion included meaning and risk of over diagnosis. YES Discussion included meaning and risk of False Positives. YES Discussion included meaning of total radiation exposure. YES  Counseling Included: Importance of adherence to annual lung cancer LDCT screening. YES Impact of comorbidities on ability to participate in the program. YES Ability and willingness to under diagnostic treatment. YES  Smoking Cessation Counseling: Current Smokers:  Discussed importance of smoking cessation. yes Information about tobacco cessation classes and interventions provided to patient. yes Patient provided with ticket for LDCT Scan. yes Symptomatic Patient. NO Diagnosis Code: Tobacco Use Z72.0 Asymptomatic Patient yes  Counseling - 4 minutes of smoking cessation counseling (CT Chest Lung Cancer Screening Low Dose W/O  CM) [id number]  Z12.2-Screening of respiratory organs Z87.891-Personal history of nicotine dependence   Jared Rhodes 10/20/24

## 2024-10-21 ENCOUNTER — Ambulatory Visit: Admitting: Adult Health

## 2024-10-21 DIAGNOSIS — F17210 Nicotine dependence, cigarettes, uncomplicated: Secondary | ICD-10-CM

## 2024-10-23 ENCOUNTER — Other Ambulatory Visit

## 2024-11-04 ENCOUNTER — Inpatient Hospital Stay (INDEPENDENT_AMBULATORY_CARE_PROVIDER_SITE_OTHER): Admitting: Primary Care

## 2024-11-13 ENCOUNTER — Ambulatory Visit (INDEPENDENT_AMBULATORY_CARE_PROVIDER_SITE_OTHER)

## 2024-12-01 ENCOUNTER — Encounter (INDEPENDENT_AMBULATORY_CARE_PROVIDER_SITE_OTHER): Payer: Self-pay | Admitting: Primary Care

## 2024-12-01 ENCOUNTER — Ambulatory Visit (INDEPENDENT_AMBULATORY_CARE_PROVIDER_SITE_OTHER): Admitting: Primary Care

## 2024-12-01 VITALS — BP 102/68 | HR 87 | Resp 16 | Ht 68.0 in | Wt 213.8 lb

## 2024-12-01 DIAGNOSIS — K56609 Unspecified intestinal obstruction, unspecified as to partial versus complete obstruction: Secondary | ICD-10-CM

## 2024-12-01 DIAGNOSIS — Z09 Encounter for follow-up examination after completed treatment for conditions other than malignant neoplasm: Secondary | ICD-10-CM

## 2024-12-01 DIAGNOSIS — I10 Essential (primary) hypertension: Secondary | ICD-10-CM

## 2024-12-01 DIAGNOSIS — Z720 Tobacco use: Secondary | ICD-10-CM

## 2024-12-01 NOTE — Progress Notes (Unsigned)
 Subjective:   Jared Rhodes. is a 73 y.o. male presents for hospital follow up and establish care. Admit date to the hospital was 10/12/24, patient was discharged from the hospital on 10/13/24, patient was admitted for:  Patient has given permission for Ms. Gwenn to be present at his appointment and provide information that he may forget. Past Medical History:  Diagnosis Date   Hypertension    Prostate cancer (HCC) 2018   radation 40 treatments      Allergies  Allergen Reactions   Penicillins Rash    Current Outpatient Medications on File Prior to Visit  Medication Sig Dispense Refill   acetaminophen  (TYLENOL ) 325 MG tablet Take 650 mg by mouth daily as needed for moderate pain (pain score 4-6) or mild pain (pain score 1-3).     amLODipine  (NORVASC ) 10 MG tablet Take 1 tablet (10 mg total) by mouth daily.     docusate sodium  (COLACE) 100 MG capsule Take 1 capsule (100 mg total) by mouth daily. 10 capsule 0   ondansetron  (ZOFRAN ) 4 MG tablet Take 1 tablet (4 mg total) by mouth every 8 (eight) hours as needed for nausea or vomiting. 20 tablet 0   oxybutynin  (DITROPAN ) 5 MG tablet Take 5 mg by mouth daily.     No current facility-administered medications on file prior to visit.    Review of System: ROS Comprehensive ROS Pertinent positive and negative noted in HPI   Objective:  BP 102/68   Pulse 87   Resp 16   Ht 5' 8 (1.727 m)   Wt 213 lb 12.8 oz (97 kg)   SpO2 98%   BMI 32.51 kg/m   Filed Weights   12/01/24 1354  Weight: 213 lb 12.8 oz (97 kg)    Physical Exam Vitals reviewed.  Constitutional:      Appearance: He is obese.  HENT:     Head: Normocephalic.     Right Ear: Tympanic membrane and external ear normal.     Left Ear: Tympanic membrane and external ear normal.     Nose: Nose normal.  Eyes:     Extraocular Movements: Extraocular movements intact.     Pupils: Pupils are equal, round, and reactive to light.  Cardiovascular:     Rate and  Rhythm: Normal rate and regular rhythm.  Pulmonary:     Effort: Pulmonary effort is normal.     Breath sounds: Normal breath sounds.  Abdominal:     General: Bowel sounds are normal. There is distension.     Palpations: Abdomen is soft.  Musculoskeletal:        General: Normal range of motion.     Cervical back: Normal range of motion and neck supple.  Skin:    General: Skin is warm and dry.  Neurological:     Mental Status: He is oriented to person, place, and time.  Psychiatric:        Mood and Affect: Mood normal.        Behavior: Behavior normal.        Thought Content: Thought content normal.        Judgment: Judgment normal.      Assessment:  Standley was seen today for hospitalization follow-up.  Diagnoses and all orders for this visit:  Hospital discharge follow-up 2/2 SBO (small bowel obstruction) (HCC) -     CMP14+EGFR -     CBC with Differential   Primary hypertension Well controlled  DIET: Limit salt intake, read nutrition labels  to check salt content, limit fried and high fatty foods  Avoid using multisymptom OTC cold preparations that generally contain sudafed which can rise BP. Consult with pharmacist on best cold relief products to use for persons with HTN EXERCISE Discussed incorporating exercise such as walking - 30 minutes most days of the week and can do in 10 minute intervals     Tobacco abuse -     Ambulatory Referral for Lung Cancer Screening [REF832]     This note has been created with Dragon speech recognition software and smart lobbyist. Any transcriptional errors are unintentional.   Return in about 6 months (around 06/01/2025).  Rosaline SHAUNNA Bohr, NP 12/06/2024, 9:31 PM

## 2024-12-02 ENCOUNTER — Telehealth: Payer: Self-pay

## 2024-12-02 LAB — CBC WITH DIFFERENTIAL/PLATELET
Basophils Absolute: 0 x10E3/uL (ref 0.0–0.2)
Basos: 1 %
EOS (ABSOLUTE): 0.2 x10E3/uL (ref 0.0–0.4)
Eos: 4 %
Hematocrit: 39.3 % (ref 37.5–51.0)
Hemoglobin: 12.4 g/dL — ABNORMAL LOW (ref 13.0–17.7)
Immature Grans (Abs): 0 x10E3/uL (ref 0.0–0.1)
Immature Granulocytes: 0 %
Lymphocytes Absolute: 1.5 x10E3/uL (ref 0.7–3.1)
Lymphs: 31 %
MCH: 28.5 pg (ref 26.6–33.0)
MCHC: 31.6 g/dL (ref 31.5–35.7)
MCV: 90 fL (ref 79–97)
Monocytes Absolute: 0.5 x10E3/uL (ref 0.1–0.9)
Monocytes: 10 %
Neutrophils Absolute: 2.5 x10E3/uL (ref 1.4–7.0)
Neutrophils: 54 %
Platelets: 253 x10E3/uL (ref 150–450)
RBC: 4.35 x10E6/uL (ref 4.14–5.80)
RDW: 14.3 % (ref 11.6–15.4)
WBC: 4.7 x10E3/uL (ref 3.4–10.8)

## 2024-12-02 LAB — CMP14+EGFR
ALT: 14 IU/L (ref 0–44)
AST: 19 IU/L (ref 0–40)
Albumin: 4.1 g/dL (ref 3.8–4.8)
Alkaline Phosphatase: 53 IU/L (ref 47–123)
BUN/Creatinine Ratio: 31 — ABNORMAL HIGH (ref 10–24)
BUN: 26 mg/dL (ref 8–27)
Bilirubin Total: <0.2 mg/dL (ref 0.0–1.2)
CO2: 21 mmol/L (ref 20–29)
Calcium: 9.4 mg/dL (ref 8.6–10.2)
Chloride: 107 mmol/L — ABNORMAL HIGH (ref 96–106)
Creatinine, Ser: 0.84 mg/dL (ref 0.76–1.27)
Globulin, Total: 3.1 g/dL (ref 1.5–4.5)
Glucose: 80 mg/dL (ref 70–99)
Potassium: 4.5 mmol/L (ref 3.5–5.2)
Sodium: 140 mmol/L (ref 134–144)
Total Protein: 7.2 g/dL (ref 6.0–8.5)
eGFR: 92 mL/min/1.73 (ref >59)

## 2024-12-02 NOTE — Telephone Encounter (Addendum)
 PCS request received.   I called the patient to inquire what he needs assistance with. He said he has been falling down and had a plate put in his ankle about 3 years ago.  He said he can't bathe himself.   I explained that Medicaid will have a nurse come out to assess him and determine if he qualifies for services.  He needs to demonstrate a need for assistance with at least 2 ADLs.  He said he needs the help.  I asked him how he got this form for requesting PCS and he said his friend works for a home care agency and they gave him the form.   Rosaline - do you agree that he needs PCS?  Your note will need to address the need for PCS and have appropriate diagnoses.  HTN will not be enough to support this request.

## 2024-12-03 NOTE — Telephone Encounter (Signed)
 I will submit the document with the diagnoses of difficulty ambulating and left ankle pain.  I called the patient and explained that the [facility] referral will be submitted to Medicaid and they will have a nurse come out to assess his needs and determine if he qualifies for the services.  I also told him that there is no guarantee that he will be approved    Signed PCS request efaxed to [employer]: [phone].

## 2024-12-06 ENCOUNTER — Encounter (INDEPENDENT_AMBULATORY_CARE_PROVIDER_SITE_OTHER): Payer: Self-pay | Admitting: Primary Care

## 2024-12-07 NOTE — Telephone Encounter (Signed)
 Noted

## 2024-12-13 ENCOUNTER — Ambulatory Visit (INDEPENDENT_AMBULATORY_CARE_PROVIDER_SITE_OTHER): Payer: Self-pay | Admitting: Primary Care

## 2024-12-16 ENCOUNTER — Telehealth (INDEPENDENT_AMBULATORY_CARE_PROVIDER_SITE_OTHER): Payer: Self-pay | Admitting: Primary Care

## 2024-12-16 NOTE — Telephone Encounter (Signed)
 Copied from CRM #[id number]. Topic: Clinical - Lab/Test Results >> Dec 15, 2024 12:22 PM Shanda MATSU wrote: Reason for CRM: Patient returned call made to him, adv patient that he was contacted to adv of lab results, adv patient of the following info:  Kidney, liver function and electrolytes are essentially normal.  CBC does not indicate any anemia or bleeding disorders. There are some minor variations in your blood work that do not require any additional work up at this time   Patient had no further questions.

## 2024-12-21 NOTE — Telephone Encounter (Signed)
 noted

## 2024-12-22 ENCOUNTER — Encounter (INDEPENDENT_AMBULATORY_CARE_PROVIDER_SITE_OTHER): Payer: Self-pay

## 2025-01-19 ENCOUNTER — Ambulatory Visit: Admitting: Orthopaedic Surgery

## 2025-01-19 ENCOUNTER — Other Ambulatory Visit: Payer: Self-pay

## 2025-01-19 DIAGNOSIS — M25572 Pain in left ankle and joints of left foot: Secondary | ICD-10-CM

## 2025-01-19 MED ORDER — MELOXICAM 15 MG PO TABS: 15.0000 mg | ORAL_TABLET | Freq: Every day | ORAL | 2 refills | Status: AC

## 2025-01-19 NOTE — Progress Notes (Signed)
 "  Office Visit Note   Patient: Jared Rhodes.           Date of Birth: 01/07/1951           MRN: [id number] Visit Date: 01/19/2025              Requested by: [name], NP [address] [place],  [place] [address] PCP: Venson Candis CROME, NP   Assessment & Plan: Visit Diagnoses:  1. Pain in left ankle and joints of left foot     Plan: Impression is stage III left posterior tibial tendon dysfunction.  Treatment options discussed and he would like to try custom orthotic.  Prescription to [facility] clinic.  Follow-up as needed.  Follow-Up Instructions: Return if symptoms worsen or fail to improve.   Orders:  Orders Placed This Encounter  Procedures   XR Ankle Complete Left   Meds ordered this encounter  Medications   meloxicam  (MOBIC ) 15 MG tablet    Sig: Take 1 tablet (15 mg total) by mouth daily.    Dispense:  14 tablet    Refill:  2      Procedures: No procedures performed   Clinical Data: No additional findings.   Subjective: Chief Complaint  Patient presents with   Left Ankle - Pain    HPI Jared Rhodes is a 74 year old gentleman who is well-known to me who comes in for evaluation of longstanding left foot and ankle pain.  He is unable to stand for a long time.  Takes Tylenol  for the pain.  Feels weak in his ankle and foot and feels like it wants to give out.  He feels swelling and a hardness on the inner part of the ankle. Review of Systems  Constitutional: Negative.   HENT: Negative.    Eyes: Negative.   Respiratory: Negative.    Cardiovascular: Negative.   Gastrointestinal: Negative.   Endocrine: Negative.   Genitourinary: Negative.   Skin: Negative.   Allergic/Immunologic: Negative.   Neurological: Negative.   Hematological: Negative.   Psychiatric/Behavioral: Negative.    All other systems reviewed and are negative.    Objective: Vital Signs: There were no vitals taken for this visit.  Physical Exam Vitals and nursing note  reviewed.  Constitutional:      Appearance: He is well-developed.  HENT:     Head: Normocephalic and atraumatic.  Eyes:     Pupils: Pupils are equal, round, and reactive to light.  Pulmonary:     Effort: Pulmonary effort is normal.  Abdominal:     Palpations: Abdomen is soft.  Musculoskeletal:        General: Normal range of motion.     Cervical back: Neck supple.  Skin:    General: Skin is warm.  Neurological:     Mental Status: He is alert and oriented to person, place, and time.  Psychiatric:        Behavior: Behavior normal.        Thought Content: Thought content normal.        Judgment: Judgment normal.     Ortho Exam Examination of the left foot is consistent with stage III posterior tibial tendon dysfunction with a rigid flatfoot deformity. Specialty Comments:  No specialty comments available.  Imaging: XR Ankle Complete Left Result Date: 01/19/2025 X-rays demonstrate hardware from prior ORIF of the left ankle.  Calcification syndesmosis.  Collapse of midfoot arch.    PMFS History: Patient Active Problem List   Diagnosis Date Noted  Dehydration 10/04/2024   Ileus (HCC) 10/03/2024   SBO (small bowel obstruction) (HCC) 05/13/2024   Incisional hernia 04/30/2024   Prostate cancer (HCC) 02/01/2017   Osteoarthritis of right hip 03/02/2016   Hip joint replacement status 03/02/2016   Alcohol intoxication 07/17/2014   Type III open fracture dislocation of left ankle joint 07/17/2014   HTN (hypertension) 04/17/2014   Severe obesity (BMI >= 40) (HCC) 04/17/2014   Past Medical History:  Diagnosis Date   Hypertension    Prostate cancer (HCC) 2018   radation 40 treatments     Family History  Problem Relation Age of Onset   Alcohol abuse Mother    Heart disease Sister    Hyperlipidemia Brother    Colon cancer Neg Hx    Breast cancer Neg Hx    Prostate cancer Neg Hx    Pancreatic cancer Neg Hx    Colon polyps Neg Hx    Esophageal cancer Neg Hx    Rectal  cancer Neg Hx    Stomach cancer Neg Hx     Past Surgical History:  Procedure Laterality Date   COLONOSCOPY     I & D EXTREMITY Left 07/17/2014   Procedure: IRRIGATION AND DEBRIDEMENT EXTREMITY;  Surgeon: Kay Ozell Cummins, MD;  Location: [facility] OR;  Service: Orthopedics;  Laterality: Left;   I & D EXTREMITY Left 07/19/2014   Procedure: IRRIGATION AND DEBRIDEMENT EXTREMITY;  Surgeon: Kay Ozell Cummins, MD;  Location: [facility] OR;  Service: Orthopedics;  Laterality: Left;   INCISIONAL HERNIA REPAIR N/A 04/30/2024   Procedure: REPAIR, HERNIA, INCISIONAL;  Surgeon: Vernetta Berg, MD;  Location: [facility] OR;  Service: General;  Laterality: N/A;   INSERTION OF MESH N/A 04/30/2024   Procedure: INSERTION OF MESH;  Surgeon: Vernetta Berg, MD;  Location: [facility] OR;  Service: General;  Laterality: N/A;   LYMPHADENECTOMY Bilateral 09/25/2021   Procedure: REDGIE, PELVIC;  Surgeon: Renda Glance, MD;  Location: [facility] ORS;  Service: Urology;  Laterality: Bilateral;   ORIF ANKLE FRACTURE Left 07/19/2014   Procedure: OPEN REDUCTION INTERNAL FIXATION (ORIF) ANKLE FRACTURE;  Surgeon: Kay Ozell Cummins, MD;  Location: [facility] OR;  Service: Orthopedics;  Laterality: Left;   PROSTATE BIOPSY     ROBOT ASSISTED LAPAROSCOPIC RADICAL PROSTATECTOMY N/A 09/25/2021   Procedure: XI ROBOTIC ASSISTED LAPAROSCOPIC RADICAL PROSTATECTOMY LEVEL 3;  Surgeon: Renda Glance, MD;  Location: [facility] ORS;  Service: Urology;  Laterality: N/A;   TONSILLECTOMY     TOTAL HIP ARTHROPLASTY Right 03/02/2016   Procedure: RIGHT TOTAL HIP ARTHROPLASTY ANTERIOR APPROACH;  Surgeon: Kay CHRISTELLA Cummins, MD;  Location: [facility] OR;  Service: Orthopedics;  Laterality: Right;   Social History   Occupational History   Occupation: CAB DRIVER    Employer: [employer]   Occupation: truck hospital doctor  Tobacco Use   Smoking status: Some Days    Current packs/day: 1.00    Average packs/day: 1 pack/day for 30.0 years (30.0 ttl pk-yrs)    Types: Cigarettes   Smokeless  tobacco: Never  Vaping Use   Vaping status: Never Used  Substance and Sexual Activity   Alcohol use: Yes    Alcohol/week: 6.0 standard drinks of alcohol    Types: 6 Cans of beer per week    Comment: 6 beers per week per pt.   Drug use: No   Sexual activity: Yes    Partners: Female        "

## 2025-02-04 ENCOUNTER — Ambulatory Visit: Admitting: Podiatry

## 2025-02-16 ENCOUNTER — Ambulatory Visit (INDEPENDENT_AMBULATORY_CARE_PROVIDER_SITE_OTHER)
